# Patient Record
Sex: Male | Born: 1955 | Race: Black or African American | Hispanic: No | Marital: Single | Smoking: Current every day smoker
Health system: Southern US, Community
[De-identification: ages and names within clinical notes are randomized; demographics above are authoritative.]

## PROBLEM LIST (undated history)

## (undated) DIAGNOSIS — E785 Hyperlipidemia, unspecified: Secondary | ICD-10-CM

## (undated) DIAGNOSIS — I10 Essential (primary) hypertension: Secondary | ICD-10-CM

## (undated) DIAGNOSIS — M1990 Unspecified osteoarthritis, unspecified site: Secondary | ICD-10-CM

## (undated) DIAGNOSIS — J449 Chronic obstructive pulmonary disease, unspecified: Secondary | ICD-10-CM

## (undated) HISTORY — PX: NO PAST SURGERIES: SHX2092

---

## 1999-01-07 DIAGNOSIS — G5600 Carpal tunnel syndrome, unspecified upper limb: Secondary | ICD-10-CM | POA: Insufficient documentation

## 2017-06-09 ENCOUNTER — Emergency Department: Payer: Medicare Other

## 2017-06-09 ENCOUNTER — Other Ambulatory Visit: Payer: Self-pay

## 2017-06-09 ENCOUNTER — Encounter: Payer: Self-pay | Admitting: Emergency Medicine

## 2017-06-09 ENCOUNTER — Inpatient Hospital Stay
Admission: EM | Admit: 2017-06-09 | Discharge: 2017-06-11 | DRG: 190 | Disposition: A | Discharge status: Home / Self Care | Payer: Medicare Other | Attending: Internal Medicine | Admitting: Internal Medicine

## 2017-06-09 DIAGNOSIS — J441 Chronic obstructive pulmonary disease with (acute) exacerbation: Secondary | ICD-10-CM | POA: Diagnosis present

## 2017-06-09 DIAGNOSIS — J9601 Acute respiratory failure with hypoxia: Secondary | ICD-10-CM | POA: Diagnosis present

## 2017-06-09 DIAGNOSIS — M1990 Unspecified osteoarthritis, unspecified site: Secondary | ICD-10-CM | POA: Diagnosis present

## 2017-06-09 DIAGNOSIS — Z91013 Allergy to seafood: Secondary | ICD-10-CM

## 2017-06-09 DIAGNOSIS — F17200 Nicotine dependence, unspecified, uncomplicated: Secondary | ICD-10-CM | POA: Diagnosis present

## 2017-06-09 DIAGNOSIS — E785 Hyperlipidemia, unspecified: Secondary | ICD-10-CM | POA: Diagnosis present

## 2017-06-09 DIAGNOSIS — E876 Hypokalemia: Secondary | ICD-10-CM | POA: Diagnosis present

## 2017-06-09 DIAGNOSIS — I10 Essential (primary) hypertension: Secondary | ICD-10-CM | POA: Diagnosis present

## 2017-06-09 DIAGNOSIS — J209 Acute bronchitis, unspecified: Secondary | ICD-10-CM | POA: Diagnosis present

## 2017-06-09 DIAGNOSIS — J440 Chronic obstructive pulmonary disease with acute lower respiratory infection: Secondary | ICD-10-CM | POA: Diagnosis present

## 2017-06-09 DIAGNOSIS — Z91018 Allergy to other foods: Secondary | ICD-10-CM

## 2017-06-09 HISTORY — DX: Unspecified osteoarthritis, unspecified site: M19.90

## 2017-06-09 HISTORY — DX: Essential (primary) hypertension: I10

## 2017-06-09 HISTORY — DX: Hyperlipidemia, unspecified: E78.5

## 2017-06-09 HISTORY — DX: Chronic obstructive pulmonary disease, unspecified: J44.9

## 2017-06-09 LAB — CBC
HCT: 44.2 % (ref 40.0–52.0)
HEMOGLOBIN: 14.8 g/dL (ref 13.0–18.0)
MCH: 30.7 pg (ref 26.0–34.0)
MCHC: 33.4 g/dL (ref 32.0–36.0)
MCV: 92 fL (ref 80.0–100.0)
PLATELETS: 242 10*3/uL (ref 150–440)
RBC: 4.8 MIL/uL (ref 4.40–5.90)
RDW: 14.6 % — ABNORMAL HIGH (ref 11.5–14.5)
WBC: 4.8 10*3/uL (ref 3.8–10.6)

## 2017-06-09 LAB — BASIC METABOLIC PANEL
ANION GAP: 8 (ref 5–15)
BUN: 10 mg/dL (ref 6–20)
CHLORIDE: 104 mmol/L (ref 101–111)
CO2: 25 mmol/L (ref 22–32)
Calcium: 8.6 mg/dL — ABNORMAL LOW (ref 8.9–10.3)
Creatinine, Ser: 1 mg/dL (ref 0.61–1.24)
Glucose, Bld: 108 mg/dL — ABNORMAL HIGH (ref 65–99)
Potassium: 3.9 mmol/L (ref 3.5–5.1)
SODIUM: 137 mmol/L (ref 135–145)

## 2017-06-09 LAB — TROPONIN I: Troponin I: <0.03 ng/mL (ref <0.03)

## 2017-06-09 MED ORDER — SODIUM CHLORIDE 0.9 % IV SOLN
500.0000 mg | Freq: Once | INTRAVENOUS | Status: AC
  Administered 2017-06-09: 500 mg via INTRAVENOUS
  Filled 2017-06-09: qty 500

## 2017-06-09 MED ORDER — IPRATROPIUM-ALBUTEROL 0.5-2.5 (3) MG/3ML IN SOLN
3.0000 mL | Freq: Once | RESPIRATORY_TRACT | Status: AC
  Administered 2017-06-09: 3 mL via RESPIRATORY_TRACT
  Filled 2017-06-09: qty 3

## 2017-06-09 NOTE — H&P (Signed)
[facility] - [place] at [facility]   PATIENT NAME: Parker Brown    MR#:  [id number]  DATE OF BIRTH:  [birth year]  DATE OF ADMISSION:  06/09/2017  PRIMARY CARE PHYSICIAN: Patient, No Pcp Per   REQUESTING/REFERRING PHYSICIAN: Fanny BienQuale, MD  CHIEF COMPLAINT:   Chief Complaint  Patient presents with  . Respiratory Distress    HISTORY OF PRESENT ILLNESS:  Parker Brown  is a 62 y.o. male who presents with 3 days of progressive shortness of breath and increased wheezing.  Patient has had increased cough as well.  Work-up here in the ED is consistent with COPD exacerbation.  Patient required BiPAP initially for a brief period of time, but was able to wean off of that in the ED and is comfortable now with oxygen via nasal cannula.  Hospitalist were called for admission.  PAST MEDICAL HISTORY:   Past Medical History:  Diagnosis Date  . Arthritis   . COPD (chronic obstructive pulmonary disease) (HCC)   . Hyperlipidemia   . Hypertension      PAST SURGICAL HISTORY:   Past Surgical History:  Procedure Laterality Date  . NO PAST SURGERIES       SOCIAL HISTORY:   Social History   Tobacco Use  . Smoking status: Never Smoker  . Smokeless tobacco: Never Used  Substance Use Topics  . Alcohol use: Never    Frequency: Never     FAMILY HISTORY:  Family history reviewed and is non-contributory   DRUG ALLERGIES:   Allergies  Allergen Reactions  . Fish Oil   . Pineapple     MEDICATIONS AT HOME:   Prior to Admission medications   Not on File    REVIEW OF SYSTEMS:  Review of Systems  Constitutional: Negative for chills, fever, malaise/fatigue and weight loss.  HENT: Negative for ear pain, hearing loss and tinnitus.   Eyes: Negative for blurred vision, double vision, pain and redness.  Respiratory: Positive for cough, shortness of breath and wheezing. Negative for hemoptysis.   Cardiovascular: Negative for chest pain, palpitations, orthopnea  and leg swelling.  Gastrointestinal: Negative for abdominal pain, constipation, diarrhea, nausea and vomiting.  Genitourinary: Negative for dysuria, frequency and hematuria.  Musculoskeletal: Negative for back pain, joint pain and neck pain.  Skin:       No acne, rash, or lesions  Neurological: Negative for dizziness, tremors, focal weakness and weakness.  Endo/Heme/Allergies: Negative for polydipsia. Does not bruise/bleed easily.  Psychiatric/Behavioral: Negative for depression. The patient is not nervous/anxious and does not have insomnia.      VITAL SIGNS:   Vitals:   06/09/17 2130 06/09/17 2200 06/09/17 2215 06/09/17 2230  BP: (!) 143/98 (!) 145/92  (!) 154/93  Pulse: 80 79 76 87  Resp: 18 (!) 25 (!) 21 (!) 25  Temp:      TempSrc:      SpO2: 98% 96% 94% 95%  Weight:       Wt Readings from Last 3 Encounters:  06/09/17 68 kg (150 lb)    PHYSICAL EXAMINATION:  Physical Exam  Vitals reviewed. Constitutional: He is oriented to person, place, and time. He appears well-developed and well-nourished. No distress.  HENT:  Head: Normocephalic and atraumatic.  Mouth/Throat: Oropharynx is clear and moist.  Eyes: Pupils are equal, round, and reactive to light. Conjunctivae and EOM are normal. No scleral icterus.  Neck: Normal range of motion. Neck supple. No JVD present. No thyromegaly present.  Cardiovascular: Normal rate, regular rhythm and intact  distal pulses. Exam reveals no gallop and no friction rub.  No murmur heard. Respiratory: Effort normal. No respiratory distress. He has wheezes. He has no rales.  GI: Soft. Bowel sounds are normal. He exhibits no distension. There is no tenderness.  Musculoskeletal: Normal range of motion. He exhibits no edema.  No arthritis, no gout  Lymphadenopathy:    He has no cervical adenopathy.  Neurological: He is alert and oriented to person, place, and time. No cranial nerve deficit.  No dysarthria, no aphasia  Skin: Skin is warm and dry. No  rash noted. No erythema.  Psychiatric: He has a normal mood and affect. His behavior is normal. Judgment and thought content normal.    LABORATORY PANEL:   CBC Recent Labs  Lab 06/09/17 1946  WBC 4.8  HGB 14.8  HCT 44.2  PLT 242   ------------------------------------------------------------------------------------------------------------------  Chemistries  Recent Labs  Lab 06/09/17 1946  NA 137  K 3.9  CL 104  CO2 25  GLUCOSE 108*  BUN 10  CREATININE 1.00  CALCIUM 8.6*   ------------------------------------------------------------------------------------------------------------------  Cardiac Enzymes Recent Labs  Lab 06/09/17 1946  TROPONINI <0.03   ------------------------------------------------------------------------------------------------------------------  RADIOLOGY:  Dg Chest Port 1 View  Result Date: 06/09/2017 CLINICAL DATA:  Dyspnea for 2 days. EXAM: PORTABLE CHEST 1 VIEW COMPARISON:  None. FINDINGS: The heart size and mediastinal contours are within normal limits. Both lungs are clear. Mild hyperinflation is noted. No evidence of pneumothorax or pleural effusion. The visualized skeletal structures are unremarkable. IMPRESSION: Mild hyperinflation.  No active cardiopulmonary disease. Electronically Signed   By: Myles Rosenthal M.D.   On: 06/09/2017 20:11    EKG:   Orders placed or performed during the hospital encounter of 06/09/17  . ED EKG  . ED EKG  . EKG 12-Lead  . EKG 12-Lead  . EKG 12-Lead  . EKG 12-Lead    IMPRESSION AND PLAN:  Principal Problem:   COPD with acute exacerbation (HCC) -IV Solu-Medrol, azithromycin, duo nebs and antitussive, continue home inhalers Active Problems:   HTN (hypertension) -continue home meds   HLD (hyperlipidemia) -Home dose antilipid  Chart review performed and case discussed with ED provider. Labs, imaging and/or ECG reviewed by provider and discussed with patient/family. Management plans discussed with the  patient and/or family.  DVT PROPHYLAXIS: SubQ lovenox  GI PROPHYLAXIS: None  ADMISSION STATUS: Inpatient  CODE STATUS:   TOTAL TIME TAKING CARE OF THIS PATIENT: 45 minutes.   Laryah Neuser FIELDING 06/09/2017, 11:57 PM  [facility] [place] [facility]  Office  [phone]  CC: Primary care physician; Patient, No Pcp Per  Note:  This document was prepared using Dragon voice recognition software and may include unintentional dictation errors.

## 2017-06-09 NOTE — ED Notes (Signed)
Pt given water and meal tray.  Tolerating being off bipap well.  States some relief with breathing.

## 2017-06-09 NOTE — ED Triage Notes (Signed)
[place] c/o respiratory distress x1-2 days, 96% RA on scene with fire department, patient was given 2g mag, 125mg  solumedrol, 1 duoneb with 1 albuterol and patient became 98% oxygen saturation.  EMS vitals 29 CO2, 146/98 BP, 98 HR, 98%.  Pt presents A&Ox4, labored breathing, unable to finish complete sentences without getting SOB.

## 2017-06-09 NOTE — ED Notes (Signed)
Pt being trialed off of bipap at this time per Dr. Lorenza ChickQuale's request.  Patient instructed how to use call bell, will continue to monitor patient.

## 2017-06-09 NOTE — ED Provider Notes (Signed)
[facility] Emergency Department Provider Note   ____________________________________________   First MD Initiated Contact with Patient 06/09/17 1940     (approximate)  I have reviewed the triage vital signs and the nursing notes.   HISTORY  Chief Complaint Respiratory Distress  EM caveat: The patient tachypneic, moderate use of accessory muscles, speaking in short 1-2 word answers.  Appears notably short of breath.  Patient unable to provide notable history due to respiratory distress.  Added history obtained from EMS.  No family or other people to provide.  HPI Parker Brown is a 62 y.o. male reports a history of COPD and hypertension  Patient has had increasing shortness of breath today.  Wheezing.  Patient called EMS for severe shortness of breath.  Given 2 g magnesium, 2 nebulizer treatments, and 125 mg Solu-Medrol prehospital.  Patient reports he still having notable amount of shortness of breath.  Denies fevers or chills.  Denies any pain.  Just reports very tight in the chest and "COPD"   Past Medical History:  Diagnosis Date  . Arthritis   . COPD (chronic obstructive pulmonary disease) (HCC)   . Hyperlipidemia   . Hypertension     Patient Active Problem List   Diagnosis Date Noted  . COPD with acute exacerbation (HCC) 06/09/2017  . HTN (hypertension) 06/09/2017  . HLD (hyperlipidemia) 06/09/2017    Past Surgical History:  Procedure Laterality Date  . NO PAST SURGERIES      Prior to Admission medications   Not on File    Allergies Fish oil and Pineapple  History reviewed. No pertinent family history.  Social History Social History   Tobacco Use  . Smoking status: Never Smoker  . Smokeless tobacco: Never Used  Substance Use Topics  . Alcohol use: Never    Frequency: Never  . Drug use: Yes    Types: Marijuana    Review of Systems Constitutional: No fever/chills ENT: No sore throat. Cardiovascular: Denies chest  pain. Respiratory: Reports very short of breath and tight in the chest Neurological: Negative for headaches or weakness  EM caveat   ____________________________________________   PHYSICAL EXAM:  VITAL SIGNS: ED Triage Vitals  Enc Vitals Group     BP --      Pulse Rate 06/09/17 1940 81     Resp --      Temp 06/09/17 1940 98.2 F (36.8 C)     Temp Source 06/09/17 1940 Oral     SpO2 06/09/17 1940 96 %     Weight 06/09/17 1941 150 lb (68 kg)     Height --      Head Circumference --      Peak Flow --      Pain Score 06/09/17 1941 0     Pain Loc --      Pain Edu? --      Excl. in GC? --     Constitutional: Alert and oriented.  Moderately ill-appearing, notably dyspneic. Eyes: Conjunctivae are normal. Head: Atraumatic. Nose: No congestion/rhinnorhea. Mouth/Throat: Mucous membranes are moist. Neck: No stridor.   Cardiovascular: Normal rate, regular rhythm. Grossly normal heart sounds.  Good peripheral circulation. Respiratory: Slight forward tripoding position.  Moderate use of accessory muscles.  Appears dyspneic.  There is modest end expiratory wheezing throughout.  No crackles.  Patient appears slightly fatigued Gastrointestinal: Soft and nontender. No distention. Musculoskeletal: No lower extremity tenderness nor edema. Neurologic:  Normal speech and language. No gross focal neurologic deficits are appreciated.  Skin:  Skin is warm, slightly diaphoretic and intact. No rash noted. Psychiatric: Mood and affect are anxious.  ____________________________________________   LABS (all labs ordered are listed, but only abnormal results are displayed)  Labs Reviewed  CBC - Abnormal; Notable for the following components:      Result Value   RDW 14.6 (*)    All other components within normal limits  BASIC METABOLIC PANEL - Abnormal; Notable for the following components:   Glucose, Bld 108 (*)    Calcium 8.6 (*)    All other components within normal limits  BLOOD GAS,  VENOUS - Abnormal; Notable for the following components:   pCO2, Ven 61 (*)    Bicarbonate 32.9 (*)    Acid-Base Excess 5.0 (*)    All other components within normal limits  TROPONIN I   ____________________________________________  EKG  Reviewed and entered by me at 1945 Heart rate 85 QRS 80 QTc 460 Normal sinus rhythm, no evidence of ischemia ____________________________________________  RADIOLOGY    Chest x-ray reviewed negative for acute except for hyperinflation ____________________________________________   PROCEDURES  Procedure(s) performed: None  Procedures  Critical Care performed: Yes, see critical care note(s)  CRITICAL CARE Performed by: Sharyn Creamer   Total critical care time: 35 minutes  Critical care time was exclusive of separately billable procedures and treating other patients.  Critical care was necessary to treat or prevent imminent or life-threatening deterioration.  Critical care was time spent personally by me on the following activities: development of treatment plan with patient and/or surrogate as well as nursing, discussions with consultants, evaluation of patient's response to treatment, examination of patient, obtaining history from patient or surrogate, ordering and performing treatments and interventions, ordering and review of laboratory studies, ordering and review of radiographic studies, pulse oximetry and re-evaluation of patient's condition.  ____________________________________________   INITIAL IMPRESSION / ASSESSMENT AND PLAN / ED COURSE  Pertinent labs & imaging results that were available during my care of the patient were reviewed by me and considered in my medical decision making (see chart for details).  Presents for evaluation of moderate to severe shortness of breath with some tripoding.  Patient placed on BiPAP shortly after arrival with notable improvement in his respirations.  EKG, lab work reassuring.  No evidence  of acute cardiac etiology.  Does have a notable history of COPD and given his end expiratory wheezing and clinical presentation appears most consistent with COPD exacerbation.  Denies chest pain.  No signs or symptoms of DVT by clinical history exam.  We will continue to treat the patient with addition to EMS interventions with added nebulizers, BiPAP and further work-up.  I was pulled away during part of his evaluation to a hospital CODE BLUE.  Upon my return patient tolerating BiPAP well.  Given additional nebulizer treatment.  ----------------------------------------- 10:51 PM on 06/09/2017 -----------------------------------------  Patient has been able to be weaned off of BiPAP.  He is sitting up, tolerating well and appears much improved.  Patient agreeable with admission for further care and work-up of his COPD.  Status appears much improved.    [facility] contacted, they report no stepdown beds available.  Patient will be admitted to [facility].  ____________________________________________   FINAL CLINICAL IMPRESSION(S) / ED DIAGNOSES  Final diagnoses:  COPD exacerbation (HCC)      NEW MEDICATIONS STARTED DURING THIS VISIT:  New Prescriptions   No medications on file     Note:  This document was prepared using Dragon voice recognition  software and may include unintentional dictation errors.     Sharyn CreamerQuale, Mark, MD 06/10/17 [phone]

## 2017-06-10 LAB — CBC
HCT: 39.4 % — ABNORMAL LOW (ref 40.0–52.0)
Hemoglobin: 13.2 g/dL (ref 13.0–18.0)
MCH: 31 pg (ref 26.0–34.0)
MCHC: 33.6 g/dL (ref 32.0–36.0)
MCV: 92.1 fL (ref 80.0–100.0)
Platelets: 247 10*3/uL (ref 150–440)
RBC: 4.28 MIL/uL — ABNORMAL LOW (ref 4.40–5.90)
RDW: 14.7 % — ABNORMAL HIGH (ref 11.5–14.5)
WBC: 3 10*3/uL — ABNORMAL LOW (ref 3.8–10.6)

## 2017-06-10 LAB — BASIC METABOLIC PANEL
Anion gap: 10 (ref 5–15)
BUN: 13 mg/dL (ref 6–20)
CHLORIDE: 103 mmol/L (ref 101–111)
CO2: 23 mmol/L (ref 22–32)
CREATININE: 1.13 mg/dL (ref 0.61–1.24)
Calcium: 8.9 mg/dL (ref 8.9–10.3)
GFR calc Af Amer: >60 mL/min (ref >60)
GFR calc non Af Amer: >60 mL/min (ref >60)
GLUCOSE: 230 mg/dL — AB (ref 65–99)
Potassium: 3.4 mmol/L — ABNORMAL LOW (ref 3.5–5.1)
Sodium: 136 mmol/L (ref 135–145)

## 2017-06-10 MED ORDER — IPRATROPIUM-ALBUTEROL 0.5-2.5 (3) MG/3ML IN SOLN
3.0000 mL | RESPIRATORY_TRACT | Status: DC | PRN
  Administered 2017-06-10 – 2017-06-11 (×6): 3 mL via RESPIRATORY_TRACT
  Filled 2017-06-10 (×6): qty 3

## 2017-06-10 MED ORDER — ACETAMINOPHEN 650 MG RE SUPP: 650.0000 mg | Freq: Four times a day (QID) | RECTAL | Status: DC | PRN

## 2017-06-10 MED ORDER — ACETAMINOPHEN 325 MG PO TABS: 650.0000 mg | ORAL_TABLET | Freq: Four times a day (QID) | ORAL | Status: DC | PRN

## 2017-06-10 MED ORDER — ONDANSETRON HCL 4 MG/2ML IJ SOLN: 4.0000 mg | Freq: Four times a day (QID) | INTRAMUSCULAR | Status: DC | PRN

## 2017-06-10 MED ORDER — TIOTROPIUM BROMIDE MONOHYDRATE 18 MCG IN CAPS
18.0000 ug | ORAL_CAPSULE | Freq: Every morning | RESPIRATORY_TRACT | Status: DC
  Administered 2017-06-10 – 2017-06-11 (×2): 18 ug via RESPIRATORY_TRACT
  Filled 2017-06-10: qty 5

## 2017-06-10 MED ORDER — METHYLPREDNISOLONE SODIUM SUCC 125 MG IJ SOLR
60.0000 mg | Freq: Four times a day (QID) | INTRAMUSCULAR | Status: DC
  Administered 2017-06-10: 60 mg via INTRAVENOUS
  Filled 2017-06-10: qty 2

## 2017-06-10 MED ORDER — AZITHROMYCIN 500 MG PO TABS
500.0000 mg | ORAL_TABLET | Freq: Every day | ORAL | Status: DC
  Administered 2017-06-10: 500 mg via ORAL
  Filled 2017-06-10: qty 1

## 2017-06-10 MED ORDER — POTASSIUM CHLORIDE CRYS ER 20 MEQ PO TBCR
40.0000 meq | EXTENDED_RELEASE_TABLET | Freq: Once | ORAL | Status: AC
  Administered 2017-06-10: 09:00:00 40 meq via ORAL
  Filled 2017-06-10: qty 2

## 2017-06-10 MED ORDER — METHYLPREDNISOLONE SODIUM SUCC 40 MG IJ SOLR
40.0000 mg | Freq: Two times a day (BID) | INTRAMUSCULAR | Status: DC
  Administered 2017-06-10 – 2017-06-11 (×2): 40 mg via INTRAVENOUS
  Filled 2017-06-10 (×2): qty 1

## 2017-06-10 MED ORDER — GUAIFENESIN-DM 100-10 MG/5ML PO SYRP
5.0000 mL | ORAL_SOLUTION | ORAL | Status: DC | PRN
  Filled 2017-06-10: qty 5

## 2017-06-10 MED ORDER — ONDANSETRON HCL 4 MG PO TABS: 4.0000 mg | ORAL_TABLET | Freq: Four times a day (QID) | ORAL | Status: DC | PRN

## 2017-06-10 MED ORDER — ENOXAPARIN SODIUM 40 MG/0.4ML ~~LOC~~ SOLN: 40.0000 mg | SUBCUTANEOUS | Status: DC

## 2017-06-10 NOTE — ED Notes (Signed)
No stepdown beds available @ [facility] spoke with [facility]

## 2017-06-10 NOTE — Progress Notes (Signed)
[facility] - [place] at [facility]   PATIENT NAME: Parker Brown    MR#:  [id number]  DATE OF BIRTH:  [birth year]  SUBJECTIVE:   Patient transition from BiPAP to room air.  Still coughing and short of breath however improved since yesterday  REVIEW OF SYSTEMS:    Review of Systems  Constitutional: Negative for fever, chills weight loss HENT: Negative for ear pain, nosebleeds, congestion, facial swelling, rhinorrhea, neck pain, neck stiffness and ear discharge.   Respiratory: Positive for cough, shortness of breath, wheezing  Cardiovascular: Negative for chest pain, palpitations and leg swelling.  Gastrointestinal: Negative for heartburn, abdominal pain, vomiting, diarrhea or consitpation Genitourinary: Negative for dysuria, urgency, frequency, hematuria Musculoskeletal: Negative for back pain or joint pain Neurological: Negative for dizziness, seizures, syncope, focal weakness,  numbness and headaches.  Hematological: Does not bruise/bleed easily.  Psychiatric/Behavioral: Negative for hallucinations, confusion, dysphoric mood    Tolerating Diet:yes      DRUG ALLERGIES:   Allergies  Allergen Reactions  . Fish Oil   . Pineapple     VITALS:  Blood pressure 136/88, pulse 81, temperature 97.9 F (36.6 C), temperature source Oral, resp. rate 20, height 5\' 5"  (1.651 m), weight 65.6 kg (144 lb 11.2 oz), SpO2 99 %.  PHYSICAL EXAMINATION:  Constitutional: Appears thin and frail no distress. HENT: Normocephalic. Marland Kitchen. Oropharynx is clear and moist.  Eyes: Conjunctivae and EOM are normal. PERRLA, no scleral icterus.  Neck: Normal ROM. Neck supple. No JVD. No tracheal deviation. CVS: RRR, S1/S2 +, no murmurs, no gallops, no carotid bruit.  Pulmonary: Normal respiratory effort with decreased breath sounds throughout sounds tight some mild audible wheezing   abdominal: Soft. BS +,  no distension, tenderness, rebound or guarding.  Musculoskeletal: Normal range of  motion. No edema and no tenderness.  Neuro: Alert. CN 2-12 grossly intact. No focal deficits. Skin: Skin is warm and dry. No rash noted. Psychiatric: Normal mood and affect.      LABORATORY PANEL:   CBC Recent Labs  Lab 06/10/17 0456  WBC 3.0*  HGB 13.2  HCT 39.4*  PLT 247   ------------------------------------------------------------------------------------------------------------------  Chemistries  Recent Labs  Lab 06/10/17 0456  NA 136  K 3.4*  CL 103  CO2 23  GLUCOSE 230*  BUN 13  CREATININE 1.13  CALCIUM 8.9   ------------------------------------------------------------------------------------------------------------------  Cardiac Enzymes Recent Labs  Lab 06/09/17 1946  TROPONINI <0.03   ------------------------------------------------------------------------------------------------------------------  RADIOLOGY:  Dg Chest Port 1 View  Result Date: 06/09/2017 CLINICAL DATA:  Dyspnea for 2 days. EXAM: PORTABLE CHEST 1 VIEW COMPARISON:  None. FINDINGS: The heart size and mediastinal contours are within normal limits. Both lungs are clear. Mild hyperinflation is noted. No evidence of pneumothorax or pleural effusion. The visualized skeletal structures are unremarkable. IMPRESSION: Mild hyperinflation.  No active cardiopulmonary disease. Electronically Signed   By: Myles RosenthalJohn  Stahl M.D.   On: 06/09/2017 20:11     ASSESSMENT AND PLAN:   62 year old male with history of tobacco dependence and COPD who presents with shortness of breath.  1.  Acute hypoxic respiratory failure: Patient has been weaned from BiPAP to room air  2.  Acute exacerbation of COPD with acute bronchitis: Wean IV steroids and continue azithromycin for acute bronchitis Add cough syrup Continue nebs and inhaler  3.  Hypokalemia: Replete and recheck in a.m.  4.Tobacco dependence: Patient is encouraged to quit smoking. Counseling was provided for 4 minutes.     Management plans discussed  with the patient and  he is in agreement.  CODE STATUS: full  TOTAL TIME TAKING CARE OF THIS PATIENT: 28 minutes.     POSSIBLE D/C tomorrow, DEPENDING ON CLINICAL CONDITION.   Tate Zagal M.D on 06/10/2017 at 12:20 PM  Between 7am to 6pm - Pager - [phone] After 6pm go to [email or web address] - password [email or web address]  [facility]  Office  [phone]  CC: Primary care physician; Reid, [place], MD  Note: This dictation was prepared with Dragon dictation along with smaller phrase technology. Any transcriptional errors that result from this process are unintentional.

## 2017-06-10 NOTE — Progress Notes (Signed)
Inpatient Diabetes Program Recommendations  AACE/ADA: New Consensus Statement on Inpatient Glycemic Control (2015)  Target Ranges:  Prepandial:   less than 140 mg/dL      Peak postprandial:   less than 180 mg/dL (1-2 hours)      Critically ill patients:  140 - 180 mg/dL   Results for Parker Brown, Parker Brown (MRN [id number]) as of 06/10/2017 08:48  Ref. Range 06/09/2017 19:46 06/10/2017 04:56  Glucose Latest Ref Range: 65 - 99 mg/dL 595108 (H) 638230 (H)    Admit with: Resp Distress  History: COPD      NO History of DM noted in H&P.  Patient currently getting Solumedrol 40 mg BID.    MD- Note lab glucose elevated to 230 mg/dl this AM likely from IV steroids.  Please consider placing orders for CBG checks and Novolog Sensitive Correction Scale/ SSI (0-9 units) TID AC + HS if CBGs are elevated in hospital.      --Will follow patient during hospitalization--  Ambrose FinlandJeannine Johnston Franny Selvage RN, MSN, CDE Diabetes Coordinator Inpatient Glycemic Control Team Team Pager: [phone] (8a-5p)

## 2017-06-10 NOTE — Progress Notes (Signed)
Patient has medication in room, refuses to allow them to be stored in the pharmacy, patient educated about the safety and was told not to take  own medication while in the hospital, patient agree and understand, will continue to monitor.

## 2017-06-11 LAB — BASIC METABOLIC PANEL
Anion gap: 7 (ref 5–15)
BUN: 19 mg/dL (ref 6–20)
CHLORIDE: 105 mmol/L (ref 101–111)
CO2: 22 mmol/L (ref 22–32)
Calcium: 8.5 mg/dL — ABNORMAL LOW (ref 8.9–10.3)
Creatinine, Ser: 1.06 mg/dL (ref 0.61–1.24)
GFR calc Af Amer: >60 mL/min (ref >60)
GFR calc non Af Amer: >60 mL/min (ref >60)
Glucose, Bld: 182 mg/dL — ABNORMAL HIGH (ref 65–99)
POTASSIUM: 3.9 mmol/L (ref 3.5–5.1)
SODIUM: 134 mmol/L — AB (ref 135–145)

## 2017-06-11 LAB — HIV ANTIBODY (ROUTINE TESTING W REFLEX): HIV Screen 4th Generation wRfx: NONREACTIVE

## 2017-06-11 MED ORDER — GUAIFENESIN-DM 100-10 MG/5ML PO SYRP: 5.0000 mL | ORAL_SOLUTION | ORAL | 0 refills | Status: DC | PRN

## 2017-06-11 MED ORDER — AZITHROMYCIN 500 MG PO TABS: 500.0000 mg | ORAL_TABLET | Freq: Every day | ORAL | 0 refills | Status: AC

## 2017-06-11 MED ORDER — TIOTROPIUM BROMIDE MONOHYDRATE 18 MCG IN CAPS: 18.0000 ug | ORAL_CAPSULE | Freq: Every morning | RESPIRATORY_TRACT | 12 refills | Status: DC

## 2017-06-11 MED ORDER — PREDNISONE 20 MG PO TABS: 40.0000 mg | ORAL_TABLET | Freq: Every day | ORAL | 0 refills | Status: AC

## 2017-06-11 MED ORDER — IPRATROPIUM-ALBUTEROL 0.5-2.5 (3) MG/3ML IN SOLN: 3.0000 mL | RESPIRATORY_TRACT | 0 refills | Status: DC | PRN

## 2017-06-11 NOTE — Discharge Summary (Signed)
[employer] [facility] [place] [facility]   PATIENT NAME: Parker Brown    MR#:  [id number]  DATE OF BIRTH:  August 18, 1955  DATE OF ADMISSION:  06/09/2017 ADMITTING PHYSICIAN: Oralia Manis, MD  DATE OF DISCHARGE: 06/11/2017  PRIMARY CARE PHYSICIAN: Reid, [place], MD    ADMISSION DIAGNOSIS:  COPD exacerbation (HCC) [J44.1]  DISCHARGE DIAGNOSIS:  Principal Problem:   COPD with acute exacerbation (HCC) Active Problems:   HTN (hypertension)   HLD (hyperlipidemia)   SECONDARY DIAGNOSIS:   Past Medical History:  Diagnosis Date  . Arthritis   . COPD (chronic obstructive pulmonary disease) (HCC)   . Hyperlipidemia   . Hypertension     HOSPITAL COURSE:   62 year old male with history of COPD who presents with shortness of breath.  1.  Acute hypoxic respiratory failure: Patient was initially placed on BiPAP and wean to room air.  He ambulated around the unit without shortness of breath.  His oxygen level remained above 98%.  2.  Acute exacerbation with COPD with acute bronchitis: Patient will be discharged on oral steroids, nebulizer, inhaler and azithromycin.  3.Tobacco dependence: Patient is encouraged to quit smoking. Counseling was provided for 4 minutes. He did not want a nicotine patch discharge   DISCHARGE CONDITIONS AND DIET:   Stable for discharge on regular diet  CONSULTS OBTAINED:    DRUG ALLERGIES:   Allergies  Allergen Reactions  . Fish Oil   . Pineapple     DISCHARGE MEDICATIONS:   Allergies as of 06/11/2017      Reactions   Fish Oil    Pineapple       Medication List    TAKE these medications   azithromycin 500 MG tablet Commonly known as:  ZITHROMAX Take 1 tablet (500 mg total) by mouth daily for 3 days.   guaiFENesin-dextromethorphan 100-10 MG/5ML syrup Commonly known as:  ROBITUSSIN DM Take 5 mLs by mouth every 4 (four) hours as needed for cough.   ipratropium-albuterol 0.5-2.5 (3) MG/3ML Soln Commonly known as:  DUONEB Take  3 mLs by nebulization every 4 (four) hours as needed.   predniSONE 20 MG tablet Commonly known as:  DELTASONE Take 2 tablets (40 mg total) by mouth daily with breakfast for 3 days.   tiotropium 18 MCG inhalation capsule Commonly known as:  SPIRIVA Place 1 capsule (18 mcg total) into inhaler and inhale every morning. Start taking on:  06/12/2017         Today   CHIEF COMPLAINT:  Patient still has a cough however much improved as far shortness of breath   VITAL SIGNS:  Blood pressure (!) 141/95, pulse 94, temperature 98.1 F (36.7 C), temperature source Oral, resp. rate 18, height 5\' 5"  (1.651 m), weight 65.6 kg (144 lb 11.2 oz), SpO2 97 %.   REVIEW OF SYSTEMS:  Review of Systems  Constitutional: Negative.  Negative for chills, fever and malaise/fatigue.  HENT: Negative.  Negative for ear discharge, ear pain, hearing loss, nosebleeds and sore throat.   Eyes: Negative.  Negative for blurred vision and pain.  Respiratory: Positive for cough. Negative for hemoptysis, shortness of breath and wheezing.   Cardiovascular: Negative.  Negative for chest pain, palpitations and leg swelling.  Gastrointestinal: Negative.  Negative for abdominal pain, blood in stool, diarrhea, nausea and vomiting.  Genitourinary: Negative.  Negative for dysuria.  Musculoskeletal: Negative.  Negative for back pain.  Skin: Negative.   Neurological: Negative for dizziness, tremors, speech change, focal weakness, seizures and headaches.  Endo/Heme/Allergies:  Negative.  Does not bruise/bleed easily.  Psychiatric/Behavioral: Negative.  Negative for depression, hallucinations and suicidal ideas.     PHYSICAL EXAMINATION:  GENERAL:  [age] y.o.-year-old patient lying in the bed with no acute distress.  NECK:  Supple, no jugular venous distention. No thyroid enlargement, no tenderness.  LUNGS: Normal breath sounds bilaterally, no wheezing, rales,rhonchi  No use of accessory muscles of respiration.  CARDIOVASCULAR:  S1, S2 normal. No murmurs, rubs, or gallops.  ABDOMEN: Soft, non-tender, non-distended. Bowel sounds present. No organomegaly or mass.  EXTREMITIES: No pedal edema, cyanosis, or clubbing.  PSYCHIATRIC: The patient is alert and oriented x 3.  SKIN: No obvious rash, lesion, or ulcer.   DATA REVIEW:   CBC Recent Labs  Lab 06/10/17 0456  WBC 3.0*  HGB 13.2  HCT 39.4*  PLT 247    Chemistries  Recent Labs  Lab 06/11/17 0511  NA 134*  K 3.9  CL 105  CO2 22  GLUCOSE 182*  BUN 19  CREATININE 1.06  CALCIUM 8.5*    Cardiac Enzymes Recent Labs  Lab 06/09/17 1946  TROPONINI <0.03    Microbiology Results  @MICRORSLT48 @  RADIOLOGY:  Dg Chest Port 1 View  Result Date: 06/09/2017 CLINICAL DATA:  Dyspnea for 2 days. EXAM: PORTABLE CHEST 1 VIEW COMPARISON:  None. FINDINGS: The heart size and mediastinal contours are within normal limits. Both lungs are clear. Mild hyperinflation is noted. No evidence of pneumothorax or pleural effusion. The visualized skeletal structures are unremarkable. IMPRESSION: Mild hyperinflation.  No active cardiopulmonary disease. Electronically Signed   By: Myles RosenthalJohn  Stahl M.D.   On: 06/09/2017 20:11      Allergies as of 06/11/2017      Reactions   Fish Oil    Pineapple       Medication List    TAKE these medications   azithromycin 500 MG tablet Commonly known as:  ZITHROMAX Take 1 tablet (500 mg total) by mouth daily for 3 days.   guaiFENesin-dextromethorphan 100-10 MG/5ML syrup Commonly known as:  ROBITUSSIN DM Take 5 mLs by mouth every 4 (four) hours as needed for cough.   ipratropium-albuterol 0.5-2.5 (3) MG/3ML Soln Commonly known as:  DUONEB Take 3 mLs by nebulization every 4 (four) hours as needed.   predniSONE 20 MG tablet Commonly known as:  DELTASONE Take 2 tablets (40 mg total) by mouth daily with breakfast for 3 days.   tiotropium 18 MCG inhalation capsule Commonly known as:  SPIRIVA Place 1 capsule (18 mcg total) into inhaler  and inhale every morning. Start taking on:  06/12/2017          Management plans discussed with the patient and he is in agreement. Stable for discharge home  Patient should follow up with pcp  CODE STATUS:     Code Status Orders  (From admission, onward)        Start     Ordered   06/10/17 0110  Full code  Continuous     06/10/17 0109    Code Status History    This patient has a current code status but no historical code status.    Advance Directive Documentation     Most Recent Value  Type of Advance Directive  Living will  Pre-existing out of facility DNR order (yellow form or pink MOST form)  -  "MOST" Form in Place?  -      TOTAL TIME TAKING CARE OF THIS PATIENT: 38 minutes.    Note: This dictation was prepared  with Dragon dictation along with smaller phrase technology. Any transcriptional errors that result from this process are unintentional.  Shawni Volkov M.D on 06/11/2017 at 9:34 AM  Between 7am to 6pm - Pager - [phone] After 6pm go to [email or web address] - password [email or web address]  [facility]  Office  [phone]  CC: Primary care physician; Reid, [place], MD

## 2017-06-11 NOTE — Progress Notes (Signed)
Pt is being discharged home. Discharge papers given and explained to pt, verbalized understanding. Meds and f/u appointment reviewed. RX given. 

## 2017-06-20 LAB — BLOOD GAS, VENOUS
Acid-Base Excess: 5 mmol/L — ABNORMAL HIGH (ref 0.0–2.0)
Bicarbonate: 32.9 mmol/L — ABNORMAL HIGH (ref 20.0–28.0)
FIO2: 45
PATIENT TEMPERATURE: 37
PCO2 VEN: 61 mmHg — AB (ref 44.0–60.0)
PH VEN: 7.34 (ref 7.250–7.430)

## 2018-01-10 ENCOUNTER — Observation Stay
Admission: EM | Admit: 2018-01-10 | Discharge: 2018-01-11 | Disposition: A | Discharge status: Home / Self Care | Payer: Self-pay | Attending: Internal Medicine | Admitting: Internal Medicine

## 2018-01-10 ENCOUNTER — Emergency Department: Payer: Non-veteran care

## 2018-01-10 ENCOUNTER — Emergency Department: Payer: Self-pay

## 2018-01-10 ENCOUNTER — Other Ambulatory Visit: Payer: Self-pay

## 2018-01-10 DIAGNOSIS — I119 Hypertensive heart disease without heart failure: Secondary | ICD-10-CM | POA: Insufficient documentation

## 2018-01-10 DIAGNOSIS — F419 Anxiety disorder, unspecified: Secondary | ICD-10-CM | POA: Insufficient documentation

## 2018-01-10 DIAGNOSIS — Z8249 Family history of ischemic heart disease and other diseases of the circulatory system: Secondary | ICD-10-CM | POA: Insufficient documentation

## 2018-01-10 DIAGNOSIS — Z79899 Other long term (current) drug therapy: Secondary | ICD-10-CM | POA: Insufficient documentation

## 2018-01-10 DIAGNOSIS — M1990 Unspecified osteoarthritis, unspecified site: Secondary | ICD-10-CM | POA: Insufficient documentation

## 2018-01-10 DIAGNOSIS — J441 Chronic obstructive pulmonary disease with (acute) exacerbation: Principal | ICD-10-CM | POA: Diagnosis present

## 2018-01-10 DIAGNOSIS — E785 Hyperlipidemia, unspecified: Secondary | ICD-10-CM | POA: Insufficient documentation

## 2018-01-10 LAB — CBC WITH DIFFERENTIAL/PLATELET
Abs Immature Granulocytes: 0.01 10*3/uL (ref 0.00–0.07)
Basophils Absolute: 0.1 10*3/uL (ref 0.0–0.1)
Basophils Relative: 1 %
Eosinophils Absolute: 0.6 10*3/uL — ABNORMAL HIGH (ref 0.0–0.5)
Eosinophils Relative: 9 %
HCT: 46.1 % (ref 39.0–52.0)
Hemoglobin: 14.8 g/dL (ref 13.0–17.0)
Immature Granulocytes: 0 %
Lymphocytes Relative: 48 %
Lymphs Abs: 3.2 10*3/uL (ref 0.7–4.0)
MCH: 29.7 pg (ref 26.0–34.0)
MCHC: 32.1 g/dL (ref 30.0–36.0)
MCV: 92.4 fL (ref 80.0–100.0)
Monocytes Absolute: 0.9 10*3/uL (ref 0.1–1.0)
Monocytes Relative: 13 %
NEUTROS PCT: 29 %
Neutro Abs: 2 10*3/uL (ref 1.7–7.7)
Platelets: 212 10*3/uL (ref 150–400)
RBC: 4.99 MIL/uL (ref 4.22–5.81)
RDW: 13.9 % (ref 11.5–15.5)
WBC: 6.7 10*3/uL (ref 4.0–10.5)
nRBC: 0 % (ref 0.0–0.2)

## 2018-01-10 LAB — CBC
HCT: 41.7 % (ref 39.0–52.0)
Hemoglobin: 13.6 g/dL (ref 13.0–17.0)
MCH: 29.9 pg (ref 26.0–34.0)
MCHC: 32.6 g/dL (ref 30.0–36.0)
MCV: 91.6 fL (ref 80.0–100.0)
Platelets: 216 10*3/uL (ref 150–400)
RBC: 4.55 MIL/uL (ref 4.22–5.81)
RDW: 13.9 % (ref 11.5–15.5)
WBC: 6.1 10*3/uL (ref 4.0–10.5)
nRBC: 0 % (ref 0.0–0.2)

## 2018-01-10 LAB — COMPREHENSIVE METABOLIC PANEL
ALBUMIN: 4 g/dL (ref 3.5–5.0)
ALT: 33 U/L (ref 0–44)
AST: 43 U/L — AB (ref 15–41)
Alkaline Phosphatase: 54 U/L (ref 38–126)
Anion gap: 5 (ref 5–15)
BUN: 18 mg/dL (ref 8–23)
CHLORIDE: 104 mmol/L (ref 98–111)
CO2: 26 mmol/L (ref 22–32)
Calcium: 8.7 mg/dL — ABNORMAL LOW (ref 8.9–10.3)
Creatinine, Ser: 1.26 mg/dL — ABNORMAL HIGH (ref 0.61–1.24)
GFR calc Af Amer: >60 mL/min (ref >60)
GFR calc non Af Amer: 60 mL/min — ABNORMAL LOW (ref >60)
GLUCOSE: 109 mg/dL — AB (ref 70–99)
POTASSIUM: 4 mmol/L (ref 3.5–5.1)
SODIUM: 135 mmol/L (ref 135–145)
Total Bilirubin: 0.9 mg/dL (ref 0.3–1.2)
Total Protein: 7.9 g/dL (ref 6.5–8.1)

## 2018-01-10 LAB — LIPASE, BLOOD: Lipase: 60 U/L — ABNORMAL HIGH (ref 11–51)

## 2018-01-10 LAB — TROPONIN I: Troponin I: <0.03 ng/mL (ref <0.03)

## 2018-01-10 LAB — INFLUENZA PANEL BY PCR (TYPE A & B)
Influenza A By PCR: NEGATIVE
Influenza B By PCR: NEGATIVE

## 2018-01-10 LAB — CREATININE, SERUM
Creatinine, Ser: 1.33 mg/dL — ABNORMAL HIGH (ref 0.61–1.24)
GFR calc Af Amer: >60 mL/min (ref >60)
GFR calc non Af Amer: 56 mL/min — ABNORMAL LOW (ref >60)

## 2018-01-10 LAB — BRAIN NATRIURETIC PEPTIDE: B Natriuretic Peptide: 15 pg/mL (ref 0.0–100.0)

## 2018-01-10 MED ORDER — GUAIFENESIN-DM 100-10 MG/5ML PO SYRP: 5.0000 mL | ORAL_SOLUTION | ORAL | Status: DC | PRN

## 2018-01-10 MED ORDER — ONDANSETRON HCL 4 MG/2ML IJ SOLN: 4.0000 mg | Freq: Four times a day (QID) | INTRAMUSCULAR | Status: DC | PRN

## 2018-01-10 MED ORDER — IPRATROPIUM-ALBUTEROL 0.5-2.5 (3) MG/3ML IN SOLN
3.0000 mL | Freq: Once | RESPIRATORY_TRACT | Status: AC
  Administered 2018-01-10: 3 mL via RESPIRATORY_TRACT

## 2018-01-10 MED ORDER — TIOTROPIUM BROMIDE MONOHYDRATE 18 MCG IN CAPS
18.0000 ug | ORAL_CAPSULE | Freq: Every morning | RESPIRATORY_TRACT | Status: DC
  Administered 2018-01-10 – 2018-01-11 (×2): 18 ug via RESPIRATORY_TRACT
  Filled 2018-01-10 (×2): qty 5

## 2018-01-10 MED ORDER — LORAZEPAM 1 MG PO TABS
1.0000 mg | ORAL_TABLET | Freq: Four times a day (QID) | ORAL | Status: DC | PRN
  Administered 2018-01-10: 1 mg via ORAL
  Filled 2018-01-10: qty 1

## 2018-01-10 MED ORDER — ENOXAPARIN SODIUM 40 MG/0.4ML ~~LOC~~ SOLN
40.0000 mg | SUBCUTANEOUS | Status: DC
  Administered 2018-01-10: 40 mg via SUBCUTANEOUS
  Filled 2018-01-10: qty 0.4

## 2018-01-10 MED ORDER — GABAPENTIN 400 MG PO CAPS
400.0000 mg | ORAL_CAPSULE | Freq: Three times a day (TID) | ORAL | Status: DC
  Administered 2018-01-10 – 2018-01-11 (×3): 400 mg via ORAL
  Filled 2018-01-10 (×3): qty 1

## 2018-01-10 MED ORDER — IPRATROPIUM-ALBUTEROL 0.5-2.5 (3) MG/3ML IN SOLN
3.0000 mL | Freq: Four times a day (QID) | RESPIRATORY_TRACT | Status: DC
  Administered 2018-01-10 – 2018-01-11 (×3): 3 mL via RESPIRATORY_TRACT
  Filled 2018-01-10 (×3): qty 3

## 2018-01-10 MED ORDER — VITAMIN C 500 MG PO TABS
500.0000 mg | ORAL_TABLET | Freq: Every day | ORAL | Status: DC
  Administered 2018-01-10 – 2018-01-11 (×2): 500 mg via ORAL
  Filled 2018-01-10 (×2): qty 1

## 2018-01-10 MED ORDER — ALBUTEROL SULFATE (2.5 MG/3ML) 0.083% IN NEBU
2.5000 mg | INHALATION_SOLUTION | RESPIRATORY_TRACT | Status: DC | PRN
  Administered 2018-01-11: 2.5 mg via RESPIRATORY_TRACT
  Filled 2018-01-10: qty 3

## 2018-01-10 MED ORDER — ALBUTEROL SULFATE (2.5 MG/3ML) 0.083% IN NEBU: 5.0000 mg | INHALATION_SOLUTION | Freq: Once | RESPIRATORY_TRACT | Status: DC

## 2018-01-10 MED ORDER — ACETAMINOPHEN 325 MG PO TABS: 650.0000 mg | ORAL_TABLET | Freq: Four times a day (QID) | ORAL | Status: DC | PRN

## 2018-01-10 MED ORDER — ACETAMINOPHEN 650 MG RE SUPP: 650.0000 mg | Freq: Four times a day (QID) | RECTAL | Status: DC | PRN

## 2018-01-10 MED ORDER — METHYLPREDNISOLONE SODIUM SUCC 125 MG IJ SOLR
60.0000 mg | Freq: Two times a day (BID) | INTRAMUSCULAR | Status: DC
  Administered 2018-01-10 (×2): 60 mg via INTRAVENOUS
  Filled 2018-01-10 (×2): qty 2

## 2018-01-10 MED ORDER — ORAL CARE MOUTH RINSE
15.0000 mL | Freq: Two times a day (BID) | OROMUCOSAL | Status: DC
  Administered 2018-01-10: 15 mL via OROMUCOSAL

## 2018-01-10 MED ORDER — POLYETHYLENE GLYCOL 3350 17 G PO PACK: 17.0000 g | PACK | Freq: Every day | ORAL | Status: DC | PRN

## 2018-01-10 MED ORDER — TRAZODONE HCL 50 MG PO TABS
150.0000 mg | ORAL_TABLET | Freq: Every day | ORAL | Status: DC
  Administered 2018-01-10: 150 mg via ORAL
  Filled 2018-01-10: qty 1

## 2018-01-10 MED ORDER — ONDANSETRON HCL 4 MG PO TABS: 4.0000 mg | ORAL_TABLET | Freq: Four times a day (QID) | ORAL | Status: DC | PRN

## 2018-01-10 MED ORDER — HYDROCHLOROTHIAZIDE 25 MG PO TABS
25.0000 mg | ORAL_TABLET | Freq: Every day | ORAL | Status: DC
  Administered 2018-01-11: 25 mg via ORAL
  Filled 2018-01-10: qty 1

## 2018-01-10 NOTE — ED Notes (Signed)
Pt given breakfast tray and juice. 

## 2018-01-10 NOTE — Progress Notes (Signed)
Attempted bipap with this patient. Patient states his anxiety is too high to tolerate. Asked for mask to be removed after wearing appx 3 mins. Patient was on room air prior to my arrival. o2 saturation 95. Informed Dr. York Cerise.

## 2018-01-10 NOTE — Plan of Care (Signed)
Stable breathing treatments given.  Problem: Education: Goal: Knowledge of General Education information will improve Description Including pain rating scale, medication(s)/side effects and non-pharmacologic comfort measures Outcome: Progressing   Problem: Health Behavior/Discharge Planning: Goal: Ability to manage health-related needs will improve Outcome: Progressing   Problem: Clinical Measurements: Goal: Ability to maintain clinical measurements within normal limits will improve Outcome: Progressing Goal: Will remain free from infection Outcome: Progressing Goal: Diagnostic test results will improve Outcome: Progressing Goal: Respiratory complications will improve Outcome: Progressing Goal: Cardiovascular complication will be avoided Outcome: Progressing   Problem: Activity: Goal: Risk for activity intolerance will decrease Outcome: Progressing   Problem: Nutrition: Goal: Adequate nutrition will be maintained Outcome: Progressing   Problem: Coping: Goal: Level of anxiety will decrease Outcome: Progressing   Problem: Elimination: Goal: Will not experience complications related to bowel motility Outcome: Progressing Goal: Will not experience complications related to urinary retention Outcome: Progressing   Problem: Pain Managment: Goal: General experience of comfort will improve Outcome: Progressing   Problem: Safety: Goal: Ability to remain free from injury will improve Outcome: Progressing   Problem: Skin Integrity: Goal: Risk for impaired skin integrity will decrease Outcome: Progressing

## 2018-01-10 NOTE — ED Triage Notes (Signed)
Pt stated that he has been out of his respiratory medication for the past 2 weeks. Pt stated that he feels SOB. EMS stated that pt had wheezing on their arrival and pt was given 1 albuterol, 2 duomeds, 125 solumedrol and 2 mg of mag IV.

## 2018-01-10 NOTE — ED Notes (Signed)
ED TO INPATIENT HANDOFF REPORT  [name] 63 [facility]  Code Status Code Status History    Date Active Date Inactive Code Status Order ID Comments User Context   06/10/2017 0109 06/11/2017 1641 Full Code [id number]  Oralia ManisWillis, David, MD Inpatient      Home/SNF/Other Home  Chief Complaint Respiratory distress  Level of Care/Admitting Diagnosis ED Disposition    ED Disposition Condition Comment   Admit  [facility] [[id number]]  Level of Care: Med-Surg [16]  Diagnosis: COPD exacerbation [facility](HCC) [[id number]][[id number]]  Admitting Physician: Willadean CarolMAYO, KATY DODD [[id number]]  Attending Physician: Willadean CarolMAYO, KATY DODD [[id number]]  PT Class (Do Not Modify): Observation [104]  PT Acc Code (Do Not Modify): Observation [10022]       Medical History Past Medical History:  Diagnosis Date  . Arthritis   . COPD (chronic obstructive pulmonary disease) (HCC)   . Hyperlipidemia   . Hypertension     Allergies Allergies  Allergen Reactions  . Fish Oil   . Pineapple     IV Location/Drains/Wounds Patient Lines/Drains/Airways Status   Active Line/Drains/Airways    Name:   Placement date:   Placement time:   Site:   Days:   Peripheral IV 01/10/18 Left Antecubital   01/10/18    0528    Antecubital   less than 1          Labs/Imaging Results for orders placed or performed during the hospital encounter of 01/10/18 (from the past 48 hour(s))  Comprehensive metabolic panel     Status: Abnormal   Collection Time: 01/10/18  5:35 AM  Result Value Ref Range   Sodium 135 135 - 145 mmol/L   Potassium 4.0 3.5 - 5.1 mmol/L    Comment: HEMOLYSIS AT THIS LEVEL MAY AFFECT RESULT   Chloride 104 98 - 111 mmol/L   CO2 26 22 - 32 mmol/L   Glucose, Bld 109 (H) 70 - 99 mg/dL   BUN 18 8 - 23 mg/dL   Creatinine, Ser 9.521.26 (H) 0.61 - 1.24 mg/dL   Calcium 8.7 (L) 8.9 - 10.3 mg/dL   Total Protein 7.9 6.5 - 8.1 g/dL   Albumin 4.0 3.5 - 5.0 g/dL   AST 43 (H) 15 - 41 U/L   Comment: HEMOLYSIS AT THIS LEVEL MAY AFFECT RESULT   ALT 33 0 - 44 U/L   Alkaline Phosphatase 54 38 - 126 U/L   Total Bilirubin 0.9 0.3 - 1.2 mg/dL    Comment: HEMOLYSIS AT THIS LEVEL MAY AFFECT RESULT   GFR calc non Af Amer 60 (L) >60 mL/min   GFR calc Af Amer >60 >60 mL/min   Anion gap 5 5 - 15    Comment: Performed at [facility], [address] [place], [place] [address]  Lipase, blood     Status: Abnormal   Collection Time: 01/10/18  5:35 AM  Result Value Ref Range   Lipase 60 (H) 11 - 51 U/L    Comment: Performed at [facility], [address] [place], [place] [address]  Brain natriuretic peptide     Status: None   Collection Time: 01/10/18  5:35 AM  Result Value Ref Range   B Natriuretic Peptide 15.0 0.0 - 100.0 pg/mL    Comment: Performed at [facility], [address] [place], [place] [address]  Troponin I - Once     Status: None   Collection Time: 01/10/18  5:35 AM  Result Value Ref Range  Troponin I <0.03 <0.03 ng/mL    Comment: Performed at [facility], [address] [place], [place] [address]  CBC with Differential     Status: Abnormal   Collection Time: 01/10/18  5:35 AM  Result Value Ref Range   WBC 6.7 4.0 - 10.5 K/uL   RBC 4.99 4.22 - 5.81 MIL/uL   Hemoglobin 14.8 13.0 - 17.0 g/dL   HCT 09.4 07.6 - 80.8 %   MCV 92.4 80.0 - 100.0 fL   MCH 29.7 26.0 - 34.0 pg   MCHC 32.1 30.0 - 36.0 g/dL   RDW 81.1 03.1 - 59.4 %   Platelets 212 150 - 400 K/uL   nRBC 0.0 0.0 - 0.2 %   Neutrophils Relative % 29 %   Neutro Abs 2.0 1.7 - 7.7 K/uL   Lymphocytes Relative 48 %   Lymphs Abs 3.2 0.7 - 4.0 K/uL   Monocytes Relative 13 %   Monocytes Absolute 0.9 0.1 - 1.0 K/uL   Eosinophils Relative 9 %   Eosinophils Absolute 0.6 (H) 0.0 - 0.5 K/uL   Basophils Relative 1 %   Basophils Absolute 0.1 0.0 - 0.1 K/uL   Immature Granulocytes 0 %   Abs Immature Granulocytes 0.01 0.00 - 0.07 K/uL    Comment: Performed at [facility], [address] [place], [place] [address]  Blood gas, venous     Status: Abnormal (Preliminary result)   Collection Time: 01/10/18  5:44 AM  Result Value Ref Range   pH, Ven 7.32 7.250 - 7.430   pCO2, Ven 57 44.0 - 60.0 mmHg   pO2, Ven PENDING 32.0 - 45.0 mmHg   Bicarbonate 29.4 (H) 20.0 - 28.0 mmol/L   Acid-Base Excess 2.0 0.0 - 2.0 mmol/L   O2 Saturation 53.3 %   Patient temperature 37.0    Collection site LINE    Sample type VENOUS     Comment: Performed at [facility], [address], [place], [place] [address]  Influenza panel by PCR (type A & B)     Status: None   Collection Time: 01/10/18  6:15 AM  Result Value Ref Range   Influenza A By PCR NEGATIVE NEGATIVE   Influenza B By PCR NEGATIVE NEGATIVE    Comment: (NOTE) The Xpert Xpress Flu assay is intended as an aid in the diagnosis of  influenza and should not be used as a sole basis for treatment.  This  assay is FDA approved for nasopharyngeal swab specimens only. Nasal  washings and aspirates are unacceptable for Xpert Xpress Flu testing. Performed at [facility], [address] [place], [place] [address]    Dg Chest Portable 1 View  Result Date: 01/10/2018 CLINICAL DATA:  Acute onset of shortness of breath and wheezing. EXAM: PORTABLE CHEST 1 VIEW COMPARISON:  Chest radiograph performed 06/09/2017 FINDINGS: The lungs are mildly hyperexpanded and grossly clear. There is no evidence of focal opacification, pleural effusion or pneumothorax. The cardiomediastinal silhouette is within normal limits. No acute osseous abnormalities are seen. IMPRESSION: No acute cardiopulmonary process seen. Electronically Signed   By: Roanna Raider M.D.   On: 01/10/2018 06:41    Pending Labs Unresulted Labs (From admission, onward)    Start     Ordered   Signed and Held  CBC  (enoxaparin (LOVENOX)    CrCl >/= 30 ml/min)  Once,   R    Comments:  Baseline for enoxaparin therapy IF NOT ALREADY DRAWN.  Notify MD if  PLT < 100 K.    Signed and Held   Signed and Held  Creatinine, serum  (enoxaparin (LOVENOX)    CrCl >/= 30 ml/min)  Once,   R    Comments:  Baseline for enoxaparin therapy IF NOT ALREADY DRAWN.    Signed and Held   Signed and Held  Creatinine, serum  (enoxaparin (LOVENOX)    CrCl >/= 30 ml/min)  Weekly,   R    Comments:  while on enoxaparin therapy    Signed and Held   Signed and Held  Basic metabolic panel  Tomorrow morning,   R     Signed and Held   Signed and Held  CBC  Tomorrow morning,   R     Signed and Held          Vitals/Pain Today's Vitals   01/10/18 0730 01/10/18 0800 01/10/18 0830 01/10/18 0900  BP: 112/89 108/75 110/67 114/75  Pulse: 89 84 88 85  Resp: (!) 30 20 16  (!) 22  SpO2: (!) 88% 93% 92% 93%  Weight:      Height:      PainSc:        Isolation Precautions Droplet precaution  Medications Medications  ipratropium-albuterol (DUONEB) 0.5-2.5 (3) MG/3ML nebulizer solution 3 mL (3 mLs Nebulization Given 01/10/18 0559)  ipratropium-albuterol (DUONEB) 0.5-2.5 (3) MG/3ML nebulizer solution 3 mL (3 mLs Nebulization Given 01/10/18 3785)    Mobility walks

## 2018-01-10 NOTE — ED Notes (Signed)
Pt refused BiPap. Dr. York Cerise made aware.

## 2018-01-10 NOTE — ED Provider Notes (Signed)
[facility] Emergency Department Provider Note  ____________________________________________   First MD Initiated Contact with Patient 01/10/18 0600     (approximate)  I have reviewed the triage vital signs and the nursing notes.   HISTORY  Chief Complaint Shortness of Breath   Level 5 caveat:  history/ROS limited by acute/critical illness   HPI Parker Brown is a 63 y.o. male his medical history most notably includes COPD and presents by EMS for evaluation of gradually worsening shortness of breath over the last 2 to 3 days.  He reports that he has been out of all of his COPD medications for about 2 weeks but he has not had any trouble into the last couple of days.  Over that period of time his breathing is been gradually worsening.  He has started wheezing and having frequent cough.  Nothing in particular makes it better and exertion makes his symptoms worse.  He denies fever, chest pain, nausea, vomiting, and abdominal pain.  When the paramedics arrived he was in severe distress.  Prior to arrival to the ED they administered 1 albuterol treatment, 2 duo nebs, Solu-Medrol 125 mg IV, and 2 g of magnesium by IV.  He was still wheezing and short of breath upon arrival and was started on another DuoNeb.  He reports feeling a little bit better but that his symptoms are still severe.  Past Medical History:  Diagnosis Date  . Arthritis   . COPD (chronic obstructive pulmonary disease) (HCC)   . Hyperlipidemia   . Hypertension     Patient Active Problem List   Diagnosis Date Noted  . COPD exacerbation (HCC) 01/10/2018  . COPD with acute exacerbation (HCC) 06/09/2017  . HTN (hypertension) 06/09/2017  . HLD (hyperlipidemia) 06/09/2017    Past Surgical History:  Procedure Laterality Date  . NO PAST SURGERIES      Prior to Admission medications   Medication Sig Start Date End Date Taking? Authorizing Provider  gabapentin (NEURONTIN) 400 MG capsule Take  400 mg by mouth 3 (three) times daily.   Yes [provider]  guaiFENesin-dextromethorphan (ROBITUSSIN DM) 100-10 MG/5ML syrup Take 5 mLs by mouth every 4 (four) hours as needed for cough. 06/11/17  Yes Mody, Patricia PesaSital, MD  hydrochlorothiazide (HYDRODIURIL) 25 MG tablet Take 25 mg by mouth daily.   Yes [provider]  ipratropium-albuterol (DUONEB) 0.5-2.5 (3) MG/3ML SOLN Take 3 mLs by nebulization every 4 (four) hours as needed. 06/11/17  Yes Adrian SaranMody, Sital, MD  tiotropium (SPIRIVA) 18 MCG inhalation capsule Place 1 capsule (18 mcg total) into inhaler and inhale every morning. 06/12/17  Yes Adrian SaranMody, Sital, MD  traZODone (DESYREL) 100 MG tablet Take 150 mg by mouth at bedtime.   Yes [provider]  vitamin C (ASCORBIC ACID) 500 MG tablet Take 500 mg by mouth daily.   Yes [provider]    Allergies Fish oil and Pineapple  No family history on file.  Social History Social History   Tobacco Use  . Smoking status: Never Smoker  . Smokeless tobacco: Never Used  Substance Use Topics  . Alcohol use: Never    Frequency: Never  . Drug use: Yes    Types: Marijuana    Review of Systems Constitutional: No fever/chills Eyes: No visual changes. ENT: No sore throat. Cardiovascular: Denies chest pain. Respiratory: Severe shortness of breath over the last couple of days with wheezing Gastrointestinal: No abdominal pain.  No nausea, no vomiting.  No diarrhea.  No constipation. Genitourinary:  Negative for dysuria. Musculoskeletal: Negative for neck pain.  Negative for back pain. Integumentary: Negative for rash. Neurological: Negative for headaches, focal weakness or numbness.   ____________________________________________   PHYSICAL EXAM:  VITAL SIGNS: ED Triage Vitals  Enc Vitals Group     BP 01/10/18 0529 (!) 149/93     Pulse Rate 01/10/18 0529 (!) 102     Resp 01/10/18 0529 18     Temp --      Temp src --      SpO2 01/10/18 0529 100 %     Weight 01/10/18  0530 70.3 kg (155 lb)     Height 01/10/18 0530 1.676 m (5\' 6" )     Head Circumference --      Peak Flow --      Pain Score 01/10/18 0528 5     Pain Loc --      Pain Edu? --      Excl. in GC? --     Constitutional: Alert and oriented.  Moderate to severe respiratory distress. Eyes: Conjunctivae are normal.  Head: Atraumatic. Nose: No congestion/rhinnorhea. Mouth/Throat: Mucous membranes are dry. Neck: No stridor.  No meningeal signs.   Cardiovascular: Borderline tachycardia, regular rhythm. Good peripheral circulation. Grossly normal heart sounds. Respiratory: Increased respiratory effort with accessory muscle usage and intercostal retractions.  He has decreased air movement throughout with wheezing in spite of 4 breathing treatments at the time I evaluated him. Gastrointestinal: Thin body habitus.  Soft and nontender. No distention.  Musculoskeletal: No lower extremity tenderness nor edema. No gross deformities of extremities. Neurologic:  Normal speech and language. No gross focal neurologic deficits are appreciated.  Skin:  Skin is warm, dry and intact. No rash noted. Psychiatric: Mood and affect are anxious but understandable under the circumstances.  ____________________________________________   LABS (all labs ordered are listed, but only abnormal results are displayed)  Labs Reviewed  COMPREHENSIVE METABOLIC PANEL - Abnormal; Notable for the following components:      Result Value   Glucose, Bld 109 (*)    Creatinine, Ser 1.26 (*)    Calcium 8.7 (*)    AST 43 (*)    GFR calc non Af Amer 60 (*)    All other components within normal limits  LIPASE, BLOOD - Abnormal; Notable for the following components:   Lipase 60 (*)    All other components within normal limits  CBC WITH DIFFERENTIAL/PLATELET - Abnormal; Notable for the following components:   Eosinophils Absolute 0.6 (*)    All other components within normal limits  BLOOD GAS, VENOUS - Abnormal; Notable for the  following components:   Bicarbonate 29.4 (*)    All other components within normal limits  BRAIN NATRIURETIC PEPTIDE  TROPONIN I  INFLUENZA PANEL BY PCR (TYPE A & B)   ____________________________________________  EKG  ED ECG REPORT I, Loleta Roseory Odessie Polzin, the attending physician, personally viewed and interpreted this ECG.  Date: 01/10/2018 EKG Time: 5:32 AM Rate: 95 Rhythm: normal sinus rhythm QRS Axis: normal Intervals: normal ST/T Wave abnormalities: Non-specific ST segment / T-wave changes, but no clear evidence of acute ischemia. Narrative Interpretation: no definitive evidence of acute ischemia; does not meet STEMI criteria.   ____________________________________________  RADIOLOGY I, Loleta Roseory Trisha Morandi, personally viewed and evaluated these images (plain radiographs) as part of my medical decision making, as well as reviewing the written report by the radiologist.  ED MD interpretation: No acute abnormalities identified on chest x-ray  Official radiology report(s): Dg Chest Portable 1 View  Result Date: 01/10/2018 CLINICAL DATA:  Acute onset of shortness of breath and wheezing. EXAM: PORTABLE CHEST 1 VIEW COMPARISON:  Chest radiograph performed 06/09/2017 FINDINGS: The lungs are mildly hyperexpanded and grossly clear. There is no evidence of focal opacification, pleural effusion or pneumothorax. The cardiomediastinal silhouette is within normal limits. No acute osseous abnormalities are seen. IMPRESSION: No acute cardiopulmonary process seen. Electronically Signed   By: Roanna Raider M.D.   On: 01/10/2018 06:41    ____________________________________________   PROCEDURES  Critical Care performed: Yes, see critical care procedure note(s)   Procedure(s) performed:   .Critical Care Performed by: Loleta Rose, MD Authorized by: Loleta Rose, MD   Critical care provider statement:    Critical care time (minutes):  30   Critical care time was exclusive of:  Separately  billable procedures and treating other patients   Critical care was necessary to treat or prevent imminent or life-threatening deterioration of the following conditions:  Respiratory failure   Critical care was time spent personally by me on the following activities:  Development of treatment plan with patient or surrogate, discussions with consultants, evaluation of patient's response to treatment, examination of patient, obtaining history from patient or surrogate, ordering and performing treatments and interventions, ordering and review of laboratory studies, ordering and review of radiographic studies, pulse oximetry, re-evaluation of patient's condition and review of old charts     ____________________________________________   INITIAL IMPRESSION / ASSESSMENT AND PLAN / ED COURSE  As part of my medical decision making, I reviewed the following data within the electronic MEDICAL RECORD NUMBER Nursing notes reviewed and incorporated, Labs reviewed , EKG interpreted , Old chart reviewed, Radiograph reviewed , Discussed with admitting physician  and Notes from prior ED visits    Differential diagnosis includes, but is not limited to, COPD exacerbation, viral illness such as influenza, community-acquired pneumonia, less likely PE, ACS, pneumothorax, and aortic dissection.  The patient has been out of his medications for a couple of weeks and he became symptomatic gradually over the last few days.  His presentation is consistent with COPD exacerbation.  His vital signs are stable in spite of his significantly increased work of breathing and respiratory distress.  He has received maximal therapy prior to arrival to the hospital and he is still short of breath.  I am giving him a third DuoNeb and will give him another albuterol treatment after the DuoNeb, but given his current respiratory status I am starting him on BiPAP to see if he improves.  Hopefully can be weaned off of it and he states that he has  never been intubated or had to use the BiPAP before.  I am also having him swab for influenza prior to placing the BiPAP because this could be the trigger for his exacerbation.  Clinical Course as of Jan 11 844  Sun Jan 10, 2018  9562 Patient could not tolerate the BiPAP more than a few minutes.  I will not try to sedate him and wants to continue to treat him more conservatively.  His influenza panel was negative.  Labs notable for a PCO2 of 57 which is generally reassuring, normal troponin, normal BNP, normal CBC, slightly elevated lipase of unknown significance, and essentially normal comprehensive metabolic panel.  I will speak with the hospitalist about admission  for COPD exacerbation requiring frequent albuterol treatments.   [CF]    Clinical Course User Index [CF] Loleta Rose, MD    ____________________________________________  FINAL CLINICAL IMPRESSION(S) / ED DIAGNOSES  Final diagnoses:  COPD exacerbation (HCC)     MEDICATIONS GIVEN DURING THIS VISIT:  Medications  ipratropium-albuterol (DUONEB) 0.5-2.5 (3) MG/3ML nebulizer solution 3 mL (3 mLs Nebulization Given 01/10/18 0559)  ipratropium-albuterol (DUONEB) 0.5-2.5 (3) MG/3ML nebulizer solution 3 mL (3 mLs Nebulization Given 01/10/18 9767)     ED Discharge Orders    None       Note:  This document was prepared using Dragon voice recognition software and may include unintentional dictation errors.    Loleta Rose, MD 01/10/18 [phone]

## 2018-01-10 NOTE — Care Management Obs Status (Signed)
MEDICARE OBSERVATION STATUS NOTIFICATION   Patient Details  Name: Parker EulerMonroe Morris MRN: [id number] Date of Birth: 1955-02-22   Medicare Observation Status Notification Given:  Yes    Liylah Najarro A Manley Fason, RN 01/10/2018, 11:14 AM

## 2018-01-10 NOTE — ED Notes (Signed)
Ginger ale given to pt per Dr York Cerise

## 2018-01-10 NOTE — H&P (Signed)
[facility] [place] [facility]   PATIENT NAME: Parker Brown    MR#:  [id number]  DATE OF BIRTH:  09/02/1955  DATE OF ADMISSION:  01/10/2018  PRIMARY CARE PHYSICIAN: Parker Brown, [place], MD   REQUESTING/REFERRING PHYSICIAN: Loleta Rose, MD  CHIEF COMPLAINT:   Chief Complaint  Patient presents with  . Shortness of Breath    HISTORY OF PRESENT ILLNESS:  Parker Brown  is a 63 y.o. male with a known history of hypertension, hyperlipidemia, COPD who presented to the ED with shortness of breath.  He states he ran out of his inhalers about 2 weeks ago.  His shortness of breath started 2 days ago and has been getting progressively worse.  He endorses cough productive of "tan" sputum.  He denies any fevers or chills.  He endorses abdominal pain and headache secondary to cough.  He denies any chest pain, lower extremity edema, orthopnea.  In the ED, O2 saturations were 88-93% on room air.  Vitals were otherwise unremarkable.  Labs are significant for creatinine 1.33.  Flu was negative.  Chest x-ray was unremarkable.  He was given multiple duo nebs, steroids, mag without improvement in his work of breathing.  Hospitalists were called for admission.  PAST MEDICAL HISTORY:   Past Medical History:  Diagnosis Date  . Arthritis   . COPD (chronic obstructive pulmonary disease) (HCC)   . Hyperlipidemia   . Hypertension     PAST SURGICAL HISTORY:   Past Surgical History:  Procedure Laterality Date  . NO PAST SURGERIES      SOCIAL HISTORY:   Social History   Tobacco Use  . Smoking status: Never Smoker  . Smokeless tobacco: Never Used  Substance Use Topics  . Alcohol use: Never    Frequency: Never    FAMILY HISTORY:  Father-heart disease  DRUG ALLERGIES:   Allergies  Allergen Reactions  . Fish Oil   . Pineapple     REVIEW OF SYSTEMS:   Review of Systems  Constitutional: Negative for chills and fever.  HENT: Negative for congestion and sore throat.    Eyes: Negative for blurred vision and double vision.  Respiratory: Positive for cough, sputum production and shortness of breath.   Cardiovascular: Negative for chest pain, orthopnea and leg swelling.  Gastrointestinal: Negative for nausea and vomiting.  Genitourinary: Negative for dysuria and urgency.  Musculoskeletal: Negative for back pain and neck pain.  Neurological: Negative for dizziness and headaches.  Psychiatric/Behavioral: Negative for depression. The patient is not nervous/anxious.     MEDICATIONS AT HOME:   Prior to Admission medications   Medication Sig Start Date End Date Taking? Authorizing Provider  gabapentin (NEURONTIN) 400 MG capsule Take 400 mg by mouth 3 (three) times daily.   Yes [provider]  guaiFENesin-dextromethorphan (ROBITUSSIN DM) 100-10 MG/5ML syrup Take 5 mLs by mouth every 4 (four) hours as needed for cough. 06/11/17  Yes Mody, Patricia Pesa, MD  hydrochlorothiazide (HYDRODIURIL) 25 MG tablet Take 25 mg by mouth daily.   Yes [provider]  ipratropium-albuterol (DUONEB) 0.5-2.5 (3) MG/3ML SOLN Take 3 mLs by nebulization every 4 (four) hours as needed. 06/11/17  Yes Adrian Saran, MD  tiotropium (SPIRIVA) 18 MCG inhalation capsule Place 1 capsule (18 mcg total) into inhaler and inhale every morning. 06/12/17  Yes Adrian Saran, MD  traZODone (DESYREL) 100 MG tablet Take 150 mg by mouth at bedtime.   Yes [provider]  vitamin C (ASCORBIC ACID) 500 MG tablet Take 500 mg  by mouth daily.   Yes [provider]      VITAL SIGNS:  Blood pressure 124/69, pulse 94, temperature 98 F (36.7 C), temperature source Oral, resp. rate 20, height 5\' 6"  (1.676 m), weight 70.3 kg, SpO2 95 %.  PHYSICAL EXAMINATION:  Physical Exam  GENERAL:  63 y.o.-year-old patient lying in the bed with no acute distress.  EYES: Pupils equal, round, reactive to light and accommodation. No scleral icterus. Extraocular muscles intact.  HEENT: Head atraumatic,  normocephalic. Oropharynx and nasopharynx clear.  NECK:  Supple, no jugular venous distention. No thyroid enlargement, no tenderness.  LUNGS: + Diffuse expiratory wheezing present, diminished air movement throughout all lung fields, normal work of breathing on room air CARDIOVASCULAR: RRR, S1, S2 normal. No murmurs, rubs, or gallops.  ABDOMEN: Soft, nontender, nondistended. Bowel sounds present. No organomegaly or mass.  EXTREMITIES: No pedal edema, cyanosis, or clubbing.  NEUROLOGIC: Cranial nerves II through XII are intact. Muscle strength 5/5 in all extremities. Sensation intact. Gait not checked.  PSYCHIATRIC: The patient is alert and oriented x 3.  SKIN: No obvious rash, lesion, or ulcer.   LABORATORY PANEL:   CBC Recent Labs  Lab 01/10/18 1045  WBC 6.1  HGB 13.6  HCT 41.7  PLT 216   ------------------------------------------------------------------------------------------------------------------  Chemistries  Recent Labs  Lab 01/10/18 0535 01/10/18 1045  NA 135  --   K 4.0  --   CL 104  --   CO2 26  --   GLUCOSE 109*  --   BUN 18  --   CREATININE 1.26* 1.33*  CALCIUM 8.7*  --   AST 43*  --   ALT 33  --   ALKPHOS 54  --   BILITOT 0.9  --    ------------------------------------------------------------------------------------------------------------------  Cardiac Enzymes Recent Labs  Lab 01/10/18 0535  TROPONINI <0.03   ------------------------------------------------------------------------------------------------------------------  RADIOLOGY:  Dg Chest Portable 1 View  Result Date: 01/10/2018 CLINICAL DATA:  Acute onset of shortness of breath and wheezing. EXAM: PORTABLE CHEST 1 VIEW COMPARISON:  Chest radiograph performed 06/09/2017 FINDINGS: The lungs are mildly hyperexpanded and grossly clear. There is no evidence of focal opacification, pleural effusion or pneumothorax. The cardiomediastinal silhouette is within normal limits. No acute osseous  abnormalities are seen. IMPRESSION: No acute cardiopulmonary process seen. Electronically Signed   By: Roanna RaiderJeffery  Chang M.D.   On: 01/10/2018 06:41      IMPRESSION AND PLAN:   COPD exacerbation- likely related to patient running out of home inhalers.  Chest x-ray negative for pneumonia. -IV Solu-Medrol -DuoNebs as needed -Continue home inhalers  Hypertension-blood pressures normal -Continue HCTZ  Acute anxiety-likely related to increased work of breathing -Ativan every 6 hours as needed  DVT prophylaxis- Lovenox  Patient can likely be discharged home tomorrow.  All the records are reviewed and case discussed with ED provider. Management plans discussed with the patient, family and they are in agreement.  CODE STATUS: Full  TOTAL TIME TAKING CARE OF THIS PATIENT: 45 minutes.    Jinny BlossomKaty D Mayo M.D on 01/10/2018 at 1:33 PM  Between 7am to 6pm - Pager - [phone]  After 6pm go to [email or web address] - Social research officer, governmentpassword [email or web address] [email or web address]  [facility]  Office  [phone]  CC: Primary care physician; Parker Brown, [place], MD   Note: This dictation was prepared with Dragon dictation along with smaller phrase technology. Any transcriptional errors that result from this process are unintentional.

## 2018-01-10 NOTE — Progress Notes (Signed)
Family Meeting Note  Advance Directive:yes  Today a meeting took place with the Patient.  Patient is able to participate.  The following clinical team members were present during this meeting:MD  The following were discussed:Patient's diagnosis: , Patient's progosis: Unable to determine and Goals for treatment: Full Code   Patient is aware that he has COPD. He states that his breathing is usually under good control. He just ran out of his inhalers this time, which caused this hospitalization. We discussed code status in detail. He would like to be full code at this time.  Additional follow-up to be provided: prn  Time spent during discussion:20 minutes  Hilton SinclairKaty D Aniylah Avans, MD

## 2018-01-11 LAB — BASIC METABOLIC PANEL
Anion gap: 10 (ref 5–15)
BUN: 20 mg/dL (ref 8–23)
CO2: 22 mmol/L (ref 22–32)
Calcium: 9.3 mg/dL (ref 8.9–10.3)
Chloride: 105 mmol/L (ref 98–111)
Creatinine, Ser: 1.28 mg/dL — ABNORMAL HIGH (ref 0.61–1.24)
GFR calc Af Amer: >60 mL/min (ref >60)
GFR, EST NON AFRICAN AMERICAN: 59 mL/min — AB (ref >60)
Glucose, Bld: 139 mg/dL — ABNORMAL HIGH (ref 70–99)
Potassium: 4 mmol/L (ref 3.5–5.1)
Sodium: 137 mmol/L (ref 135–145)

## 2018-01-11 LAB — CBC
HCT: 39.4 % (ref 39.0–52.0)
HEMOGLOBIN: 12.9 g/dL — AB (ref 13.0–17.0)
MCH: 29.8 pg (ref 26.0–34.0)
MCHC: 32.7 g/dL (ref 30.0–36.0)
MCV: 91 fL (ref 80.0–100.0)
Platelets: 225 10*3/uL (ref 150–400)
RBC: 4.33 MIL/uL (ref 4.22–5.81)
RDW: 13.9 % (ref 11.5–15.5)
WBC: 9 10*3/uL (ref 4.0–10.5)
nRBC: 0 % (ref 0.0–0.2)

## 2018-01-11 MED ORDER — PREDNISONE 50 MG PO TABS: 50.0000 mg | ORAL_TABLET | Freq: Every day | ORAL | 0 refills | Status: AC

## 2018-01-11 MED ORDER — PREDNISONE 50 MG PO TABS
50.0000 mg | ORAL_TABLET | Freq: Every day | ORAL | Status: DC
  Administered 2018-01-11: 50 mg via ORAL
  Filled 2018-01-11: qty 1

## 2018-01-11 MED ORDER — IPRATROPIUM-ALBUTEROL 0.5-2.5 (3) MG/3ML IN SOLN: 3.0000 mL | RESPIRATORY_TRACT | 0 refills | Status: DC | PRN

## 2018-01-11 NOTE — Discharge Instructions (Signed)
COPD and Physical Activity Chronic obstructive pulmonary disease (COPD) is a long-term (chronic) condition that affects the lungs. COPD is a general term that can be used to describe many different lung problems that cause lung swelling (inflammation) and limit airflow, including chronic bronchitis and emphysema. The main symptom of COPD is shortness of breath, which makes it harder to do even simple tasks. This can also make it harder to exercise and be active. Talk with your health care provider about treatments to help you breathe better and actions you can take to prevent breathing problems during physical activity. What are the benefits of exercising with COPD? Exercising regularly is an important part of a healthy lifestyle. You can still exercise and do physical activities even though you have COPD. Exercise and physical activity improve your shortness of breath by increasing blood flow (circulation). This causes your heart to pump more oxygen through your body. Moderate exercise can improve your:  Oxygen use.  Energy level.  Shortness of breath.  Strength in your breathing muscles.  Heart health.  Sleep.  Self-esteem and feelings of self-worth.  Depression, stress, and anxiety levels. Exercise can benefit everyone with COPD. The severity of your disease may affect how hard you can exercise, especially at first, but everyone can benefit. Talk with your health care provider about how much exercise is safe for you, and which activities and exercises are safe for you. What actions can I take to prevent breathing problems during physical activity?  Sign up for a pulmonary rehabilitation program. This type of program may include: ? Education about lung diseases. ? Exercise classes that teach you how to exercise and be more active while improving your breathing. This usually involves:  Exercise using your lower extremities, such as a stationary bicycle.  About 30 minutes of  exercise, 2 to 5 times per week, for 6 to 12 weeks  Strength training, such as push ups or leg lifts. ? Nutrition education. ? Group classes in which you can talk with others who also have COPD and learn ways to manage stress.  If you use an oxygen tank, you should use it while you exercise. Work with your health care provider to adjust your oxygen for your physical activity. Your resting flow rate is different from your flow rate during physical activity.  While you are exercising: ? Take slow breaths. ? Pace yourself and do not try to go too fast. ? Purse your lips while breathing out. Pursing your lips is similar to a kissing or whistling position. ? If doing exercise that uses a quick burst of effort, such as weight lifting:  Breathe in before starting the exercise.  Breathe out during the hardest part of the exercise (such as raising the weights). Where to find support You can find support for exercising with COPD from:  Your health care provider.  A pulmonary rehabilitation program.  Your local health department or community health programs.  Support groups, online or in-person. Your health care provider may be able to recommend support groups. Where to find more information You can find more information about exercising with COPD from:  American Lung Association: ClassInsider.se.  COPD Foundation: https://www.rivera.net/. Contact a health care provider if:  Your symptoms get worse.  You have chest pain.  You have nausea.  You have a fever.  You have trouble talking or catching your breath.  You want to start a new exercise program or a new activity. Summary  COPD is a general term that  can be used to describe many different lung problems that cause lung swelling (inflammation) and limit airflow. This includes chronic bronchitis and emphysema.  Exercise and physical activity improve your shortness of breath by increasing blood flow (circulation). This causes your heart to  provide more oxygen to your body.  Contact your health care provider before starting any exercise program or new activity. Ask your health care provider what exercises and activities are safe for you. This information is not intended to replace advice given to you by your health care provider. Make sure you discuss any questions you have with your health care provider. Document Released: 01/15/2017 Document Revised: 01/15/2017 Document Reviewed: 01/15/2017 Elsevier Interactive Patient Education  [employer]. It was so nice to meet you during this hospitalization!  You came into the hospital with a worsening of your COPD. We gave you breathing treatments and steroids and your breathing got better.  Please make sure you take Prednisone 50mg  daily for 3 more days starting tomorrow.  You should also use your nebulizer machine every 4-6 hours for the next couple of days.  Take care, Dr. Nancy Marus

## 2018-01-11 NOTE — Progress Notes (Signed)
Nsg Discharge Note  Admit Date:  01/10/2018 Discharge date: 01/11/2018   [facility] MD order.  AVS completed.  Copy for chart, and copy for patient signed, and dated. Patient/caregiver able to verbalize understanding.  Discharge Medication: Allergies as of 01/11/2018      Reactions   Fish Oil    Pineapple       Medication List    TAKE these medications   gabapentin 400 MG capsule Commonly known as:  NEURONTIN Take 400 mg by mouth 3 (three) times daily.   guaiFENesin-dextromethorphan 100-10 MG/5ML syrup Commonly known as:  ROBITUSSIN DM Take 5 mLs by mouth every 4 (four) hours as needed for cough.   hydrochlorothiazide 25 MG tablet Commonly known as:  HYDRODIURIL Take 25 mg by mouth daily.   ipratropium-albuterol 0.5-2.5 (3) MG/3ML Soln Commonly known as:  DUONEB Take 3 mLs by nebulization every 4 (four) hours as needed.   predniSONE 50 MG tablet Commonly known as:  DELTASONE Take 1 tablet (50 mg total) by mouth daily with breakfast for 3 days.   tiotropium 18 MCG inhalation capsule Commonly known as:  SPIRIVA Place 1 capsule (18 mcg total) into inhaler and inhale every morning.   traZODone 100 MG tablet Commonly known as:  DESYREL Take 150 mg by mouth at bedtime.   vitamin C 500 MG tablet Commonly known as:  ASCORBIC ACID Take 500 mg by mouth daily.       Discharge Assessment: Vitals:   01/11/18 0257 01/11/18 0539  BP:  117/83  Pulse:  93  Resp:  18  Temp:  97.9 F (36.6 C)  SpO2: 95% 98%   Skin clean, dry and intact without evidence of skin break down, no evidence of skin tears noted. IV catheter discontinued intact. Site without signs and symptoms of complications - no redness or edema noted at insertion site, patient denies c/o pain - only slight tenderness at site.  Dressing with slight pressure applied.  D/c Instructions-Education: Discharge instructions given to patient/family with verbalized understanding. D/c education  completed with patient/family including follow up instructions, medication list, d/c activities limitations if indicated, with other d/c instructions as indicated by MD - patient able to verbalize understanding, all questions fully answered. Patient instructed to return to ED, call 911, or call MD for any changes in condition.  Patient escorted via WC, and D/C home via private auto.  Adair LaundryElizabeth A Ngai Parcell, RN 01/11/2018 11:36 AM

## 2018-01-11 NOTE — Discharge Summary (Signed)
[facility] [place] [facility]   PATIENT NAME: Parker Brown    MR#:  [id number]  DATE OF BIRTH:  1955-09-12  DATE OF ADMISSION:  01/10/2018   ADMITTING PHYSICIAN: Campbell StallKaty Dodd Chrisha Vogel, MD  DATE OF DISCHARGE: 01/11/2018 12:26 PM  PRIMARY CARE PHYSICIAN: Reid, [place], MD   ADMISSION DIAGNOSIS:  COPD exacerbation (HCC) [J44.1] DISCHARGE DIAGNOSIS:  Active Problems:   COPD exacerbation (HCC)  SECONDARY DIAGNOSIS:   Past Medical History:  Diagnosis Date  . Arthritis   . COPD (chronic obstructive pulmonary disease) (HCC)   . Hyperlipidemia   . Hypertension    HOSPITAL COURSE:   Parker Brown is a 63 year old male who presented to the ED with shortness of breath in the setting of running out of his albuterol inhaler at home.  In the ED, his oxygen saturations were 88 to 93% on room air.  Flu test was negative.  Chest x-ray was unremarkable.  He was admitted for further management.  He was treated with IV Solu-Medrol and then was transitioned to prednisone on discharge for a total 5-day course.  He was also given duo nebs and his home inhalers were continued.  On the day of discharge, he was able to ambulate without any increased work of breathing.  Oxygen saturations were in the high 90s on room air.  A new prescription for his albuterol nebulizer was called in on the day of discharge.  DISCHARGE CONDITIONS:  COPD Hyper tension Hyperlipidemia CONSULTS OBTAINED:  None DRUG ALLERGIES:   Allergies  Allergen Reactions  . Fish Oil   . Pineapple    DISCHARGE MEDICATIONS:   Allergies as of 01/11/2018      Reactions   Fish Oil    Pineapple       Medication List    TAKE these medications   gabapentin 400 MG capsule Commonly known as:  NEURONTIN Take 400 mg by mouth 3 (three) times daily.   guaiFENesin-dextromethorphan 100-10 MG/5ML syrup Commonly known as:  ROBITUSSIN DM Take 5 mLs by mouth every 4 (four) hours as needed for cough.   hydrochlorothiazide 25 MG  tablet Commonly known as:  HYDRODIURIL Take 25 mg by mouth daily.   ipratropium-albuterol 0.5-2.5 (3) MG/3ML Soln Commonly known as:  DUONEB Take 3 mLs by nebulization every 4 (four) hours as needed.   predniSONE 50 MG tablet Commonly known as:  DELTASONE Take 1 tablet (50 mg total) by mouth daily with breakfast for 3 days.   tiotropium 18 MCG inhalation capsule Commonly known as:  SPIRIVA Place 1 capsule (18 mcg total) into inhaler and inhale every morning.   traZODone 100 MG tablet Commonly known as:  DESYREL Take 150 mg by mouth at bedtime.   vitamin C 500 MG tablet Commonly known as:  ASCORBIC ACID Take 500 mg by mouth daily.        DISCHARGE INSTRUCTIONS:  1.  Follow-up with PCP in 5 days 2.  Continue prednisone daily for a total of 5 days DIET:  Cardiac diet DISCHARGE CONDITION:  Stable ACTIVITY:  Activity as tolerated OXYGEN:  Home Oxygen: No.  Oxygen Delivery: room air DISCHARGE LOCATION:  home   If you experience worsening of your admission symptoms, develop shortness of breath, life threatening emergency, suicidal or homicidal thoughts you must seek medical attention immediately by calling 911 or calling your MD immediately  if symptoms less severe.  You Must read complete instructions/literature along with all the possible adverse reactions/side effects for all the Medicines you  take and that have been prescribed to you. Take any new Medicines after you have completely understood and accpet all the possible adverse reactions/side effects.   Please note  You were cared for by a hospitalist during your hospital stay. If you have any questions about your discharge medications or the care you received while you were in the hospital after you are discharged, you can call the unit and asked to speak with the hospitalist on call if the hospitalist that took care of you is not available. Once you are discharged, your primary care physician will handle any further  medical issues. Please note that NO REFILLS for any discharge medications will be authorized once you are discharged, as it is imperative that you return to your primary care physician (or establish a relationship with a primary care physician if you do not have one) for your aftercare needs so that they can reassess your need for medications and monitor your lab values.    On the day of Discharge:  VITAL SIGNS:  Blood pressure 117/83, pulse 93, temperature 97.9 F (36.6 C), temperature source Oral, resp. rate 18, height 5\' 6"  (1.676 m), weight 70.3 kg, SpO2 98 %. PHYSICAL EXAMINATION:  GENERAL:  63 y.o.-year-old patient lying in the bed with no acute distress.  EYES: Pupils equal, round, reactive to light and accommodation. No scleral icterus. Extraocular muscles intact.  HEENT: Head atraumatic, normocephalic. Oropharynx and nasopharynx clear.  NECK:  Supple, no jugular venous distention. No thyroid enlargement, no tenderness.  LUNGS: + Mild diffuse expiratory wheezing present, no rales,rhonchi or crepitation. No use of accessory muscles of respiration.  CARDIOVASCULAR: S1, S2 normal. No murmurs, rubs, or gallops.  ABDOMEN: Soft, non-tender, non-distended. Bowel sounds present. No organomegaly or mass.  EXTREMITIES: No pedal edema, cyanosis, or clubbing.  NEUROLOGIC: Cranial nerves II through XII are intact. Muscle strength 5/5 in all extremities. Sensation intact. Gait not checked.  PSYCHIATRIC: The patient is alert and oriented x 3.  SKIN: No obvious rash, lesion, or ulcer.  DATA REVIEW:   CBC Recent Labs  Lab 01/11/18 0549  WBC 9.0  HGB 12.9*  HCT 39.4  PLT 225    Chemistries  Recent Labs  Lab 01/10/18 0535  01/11/18 0549  NA 135  --  137  K 4.0  --  4.0  CL 104  --  105  CO2 26  --  22  GLUCOSE 109*  --  139*  BUN 18  --  20  CREATININE 1.26*   < > 1.28*  CALCIUM 8.7*  --  9.3  AST 43*  --   --   ALT 33  --   --   ALKPHOS 54  --   --   BILITOT 0.9  --   --    < >  = values in this interval not displayed.     Microbiology Results  No results found for this or any previous visit.  RADIOLOGY:  No results found.   Management plans discussed with the patient, family and they are in agreement.  CODE STATUS: Full Code   TOTAL TIME TAKING CARE OF THIS PATIENT: 35 minutes.    Jinny Blossom Palin Tristan M.D on 01/11/2018 at 3:26 PM  Between 7am to 6pm - Pager [phone]  After 6pm go to [email or web address] - Social research officer, government  [facility] [place] [facility]  Office  [phone]  CC: Primary care physician; Reid, [place], MD   Note: This dictation was prepared with Dragon dictation along  with smaller phrase technology. Any transcriptional errors that result from this process are unintentional.

## 2018-01-13 LAB — BLOOD GAS, VENOUS
ACID-BASE EXCESS: 2 mmol/L (ref 0.0–2.0)
BICARBONATE: 29.4 mmol/L — AB (ref 20.0–28.0)
O2 Saturation: 53.3 %
Patient temperature: 37
pCO2, Ven: 57 mmHg (ref 44.0–60.0)
pH, Ven: 7.32 (ref 7.250–7.430)

## 2018-02-17 ENCOUNTER — Other Ambulatory Visit: Payer: Self-pay

## 2018-02-17 ENCOUNTER — Emergency Department: Payer: Non-veteran care

## 2018-02-17 ENCOUNTER — Encounter: Payer: Self-pay | Admitting: Emergency Medicine

## 2018-02-17 ENCOUNTER — Emergency Department
Admission: EM | Admit: 2018-02-17 | Discharge: 2018-02-18 | Disposition: A | Discharge status: Other Acute Inpatient Hospital | Payer: Non-veteran care | Attending: Emergency Medicine | Admitting: Emergency Medicine

## 2018-02-17 DIAGNOSIS — J441 Chronic obstructive pulmonary disease with (acute) exacerbation: Secondary | ICD-10-CM | POA: Insufficient documentation

## 2018-02-17 DIAGNOSIS — Z79899 Other long term (current) drug therapy: Secondary | ICD-10-CM | POA: Insufficient documentation

## 2018-02-17 DIAGNOSIS — J111 Influenza due to unidentified influenza virus with other respiratory manifestations: Secondary | ICD-10-CM | POA: Insufficient documentation

## 2018-02-17 DIAGNOSIS — F17210 Nicotine dependence, cigarettes, uncomplicated: Secondary | ICD-10-CM | POA: Insufficient documentation

## 2018-02-17 DIAGNOSIS — I10 Essential (primary) hypertension: Secondary | ICD-10-CM | POA: Insufficient documentation

## 2018-02-17 LAB — CBC
HCT: 44.1 % (ref 39.0–52.0)
Hemoglobin: 14.8 g/dL (ref 13.0–17.0)
MCH: 30 pg (ref 26.0–34.0)
MCHC: 33.6 g/dL (ref 30.0–36.0)
MCV: 89.3 fL (ref 80.0–100.0)
Platelets: 203 10*3/uL (ref 150–400)
RBC: 4.94 MIL/uL (ref 4.22–5.81)
RDW: 14.2 % (ref 11.5–15.5)
WBC: 6.7 10*3/uL (ref 4.0–10.5)
nRBC: 0 % (ref 0.0–0.2)

## 2018-02-17 LAB — BASIC METABOLIC PANEL
Anion gap: 10 (ref 5–15)
BUN: 20 mg/dL (ref 8–23)
CO2: 23 mmol/L (ref 22–32)
Calcium: 8.9 mg/dL (ref 8.9–10.3)
Chloride: 101 mmol/L (ref 98–111)
Creatinine, Ser: 1.42 mg/dL — ABNORMAL HIGH (ref 0.61–1.24)
GFR calc non Af Amer: 52 mL/min — ABNORMAL LOW (ref >60)
Glucose, Bld: 118 mg/dL — ABNORMAL HIGH (ref 70–99)
Potassium: 3.7 mmol/L (ref 3.5–5.1)
SODIUM: 134 mmol/L — AB (ref 135–145)

## 2018-02-17 LAB — INFLUENZA PANEL BY PCR (TYPE A & B)
INFLBPCR: NEGATIVE
Influenza A By PCR: POSITIVE — AB

## 2018-02-17 MED ORDER — SODIUM CHLORIDE 0.9% FLUSH: 3.0000 mL | Freq: Once | INTRAVENOUS | Status: DC

## 2018-02-17 MED ORDER — ALBUTEROL SULFATE (2.5 MG/3ML) 0.083% IN NEBU
5.0000 mg | INHALATION_SOLUTION | Freq: Once | RESPIRATORY_TRACT | Status: AC
  Administered 2018-02-17: 5 mg via RESPIRATORY_TRACT

## 2018-02-17 MED ORDER — IBUPROFEN 800 MG PO TABS
ORAL_TABLET | ORAL | Status: AC
  Filled 2018-02-17: qty 1

## 2018-02-17 MED ORDER — IPRATROPIUM-ALBUTEROL 0.5-2.5 (3) MG/3ML IN SOLN
9.0000 mL | Freq: Once | RESPIRATORY_TRACT | Status: AC
  Administered 2018-02-17: 9 mL via RESPIRATORY_TRACT
  Filled 2018-02-17: qty 3

## 2018-02-17 MED ORDER — AZITHROMYCIN 500 MG PO TABS
500.0000 mg | ORAL_TABLET | Freq: Once | ORAL | Status: AC
  Administered 2018-02-17: 500 mg via ORAL
  Filled 2018-02-17: qty 1

## 2018-02-17 MED ORDER — ALBUTEROL SULFATE (2.5 MG/3ML) 0.083% IN NEBU
5.0000 mg | INHALATION_SOLUTION | Freq: Once | RESPIRATORY_TRACT | Status: AC
  Administered 2018-02-17: 5 mg via RESPIRATORY_TRACT
  Filled 2018-02-17: qty 6

## 2018-02-17 MED ORDER — ALBUTEROL SULFATE (2.5 MG/3ML) 0.083% IN NEBU
INHALATION_SOLUTION | RESPIRATORY_TRACT | Status: AC
  Administered 2018-02-17: 5 mg via RESPIRATORY_TRACT
  Filled 2018-02-17: qty 6

## 2018-02-17 MED ORDER — IBUPROFEN 800 MG PO TABS
800.0000 mg | ORAL_TABLET | Freq: Once | ORAL | Status: AC
  Administered 2018-02-17: 800 mg via ORAL

## 2018-02-17 MED ORDER — PREDNISONE 20 MG PO TABS
40.0000 mg | ORAL_TABLET | Freq: Once | ORAL | Status: AC
  Administered 2018-02-17: 40 mg via ORAL
  Filled 2018-02-17: qty 2

## 2018-02-17 NOTE — ED Notes (Signed)
Ambulated pt per Schaevitz, MD. Pt's oxygen level remained at 100% notified MD.

## 2018-02-17 NOTE — ED Triage Notes (Signed)
Pt in via POV with complaints of shortness of breath, cough, generalized weakness x 3 days.  Pt with low grade fever upon arrival.  Dyspnea noted at rest.

## 2018-02-17 NOTE — ED Notes (Signed)
Pt provided mask, advised to keep on until he is roomed.

## 2018-02-17 NOTE — ED Provider Notes (Addendum)
[facility] Emergency Department Provider Note  ____________________________________________   First MD Initiated Contact with Patient 02/17/18 1821     (approximate)  I have reviewed the triage vital signs and the nursing notes.   HISTORY  Chief Complaint Shortness of Breath   HPI Parker Brown is a 63 y.o. male with a history of COPD as well as hyperlipidemia and hypertension who still smokes approximately 2 cigarettes/day who is presented emergency department today complaining of fever, body aches and shortness of breath with her cough since this past Sunday, 4 days ago.  He states that his symptoms seem to be getting worse and this is why he presented to the hospital today.  Denying any chest pain.   Past Medical History:  Diagnosis Date  . Arthritis   . COPD (chronic obstructive pulmonary disease) (HCC)   . Hyperlipidemia   . Hypertension     Patient Active Problem List   Diagnosis Date Noted  . COPD exacerbation (HCC) 01/10/2018  . COPD with acute exacerbation (HCC) 06/09/2017  . HTN (hypertension) 06/09/2017  . HLD (hyperlipidemia) 06/09/2017    Past Surgical History:  Procedure Laterality Date  . NO PAST SURGERIES      Prior to Admission medications   Medication Sig Start Date End Date Taking? Authorizing Provider  guaiFENesin-dextromethorphan (ROBITUSSIN DM) 100-10 MG/5ML syrup Take 5 mLs by mouth every 4 (four) hours as needed for cough. 06/11/17  Yes Mody, Patricia PesaSital, MD  hydrochlorothiazide (HYDRODIURIL) 25 MG tablet Take 25 mg by mouth daily.   Yes [provider]  tiotropium (SPIRIVA) 18 MCG inhalation capsule Place 1 capsule (18 mcg total) into inhaler and inhale every morning. 06/12/17  Yes Adrian SaranMody, Sital, MD  traZODone (DESYREL) 100 MG tablet Take 150 mg by mouth at bedtime.   Yes [provider]  vitamin C (ASCORBIC ACID) 500 MG tablet Take 500 mg by mouth daily.   Yes [provider]  gabapentin (NEURONTIN)  400 MG capsule Take 400 mg by mouth 3 (three) times daily.    [provider]  ipratropium-albuterol (DUONEB) 0.5-2.5 (3) MG/3ML SOLN Take 3 mLs by nebulization every 4 (four) hours as needed. 01/11/18   Mayo, Allyn KennerKaty Dodd, MD    Allergies Fish oil and Pineapple  No family history on file.  Social History Social History   Tobacco Use  . Smoking status: Current Every Day Smoker    Packs/day: 0.25    Types: Cigarettes  . Smokeless tobacco: Never Used  Substance Use Topics  . Alcohol use: Never    Frequency: Never  . Drug use: Yes    Types: Marijuana    Review of Systems  Constitutional: As above Eyes: No visual changes. ENT: No sore throat. Cardiovascular: Denies chest pain. Respiratory: As above Gastrointestinal: No abdominal pain.  No nausea, no vomiting.  No diarrhea.  No constipation. Genitourinary: Negative for dysuria. Musculoskeletal: Myalgias, generalized Skin: Negative for rash. Neurological: Negative for headaches, focal weakness or numbness.   ____________________________________________   PHYSICAL EXAM:  VITAL SIGNS: ED Triage Vitals  Enc Vitals Group     BP 02/17/18 1533 (!) 141/89     Pulse Rate 02/17/18 1533 (!) 102     Resp 02/17/18 1533 (!) 36     Temp 02/17/18 1533 (!) 100.7 F (38.2 C)     Temp Source 02/17/18 1533 Oral     SpO2 02/17/18 1532 96 %     Weight 02/17/18 1535 150 lb (68 kg)  Height 02/17/18 1535 5\' 6"  (1.676 m)     Head Circumference --      Peak Flow --      Pain Score 02/17/18 1533 7     Pain Loc --      Pain Edu? --      Excl. in GC? --     Constitutional: Alert and oriented. Well appearing and in no acute distress.  Speaking in full sentences. Eyes: Conjunctivae are normal.  Head: Atraumatic. Nose: No congestion/rhinnorhea. Mouth/Throat: Mucous membranes are moist.  Neck: No stridor.   Cardiovascular: Normal rate, regular rhythm. Grossly normal heart sounds.   Respiratory: Labored respirations with  intercostal retractions.  Not tripoding.  Decreased air movement throughout with a prolonged expiratory phase. Gastrointestinal: Soft and nontender. No distention.  Musculoskeletal: No lower extremity tenderness nor edema.  No joint effusions. Neurologic:  Normal speech and language. No gross focal neurologic deficits are appreciated. Skin:  Skin is warm, dry and intact. No rash noted. Psychiatric: Mood and affect are normal. Speech and behavior are normal.  ____________________________________________   LABS (all labs ordered are listed, but only abnormal results are displayed)  Labs Reviewed  BASIC METABOLIC PANEL - Abnormal; Notable for the following components:      Result Value   Sodium 134 (*)    Glucose, Bld 118 (*)    Creatinine, Ser 1.42 (*)    GFR calc non Af Amer 52 (*)    All other components within normal limits  INFLUENZA PANEL BY PCR (TYPE A & B) - Abnormal; Notable for the following components:   Influenza A By PCR POSITIVE (*)    All other components within normal limits  CBC   ____________________________________________  EKG  ED ECG REPORT I, Arelia Longest, the attending physician, personally viewed and interpreted this ECG.   Date: 02/17/2018  EKG Time: 1535  Rate: 102  Rhythm: sinus tachycardia  Axis: Normal  Intervals:none  ST&T Change: No ST segment elevation or depression.  No abnormal T wave inversion.  ____________________________________________  RADIOLOGY  Chest x-ray without active cardiopulmonary disease ____________________________________________   PROCEDURES  Procedure(s) performed:   Procedures  Critical Care performed:   ____________________________________________   INITIAL IMPRESSION / ASSESSMENT AND PLAN / ED COURSE  Pertinent labs & imaging results that were available during my care of the patient were reviewed by me and considered in my medical decision making (see chart for details).  Differential includes,  but is not limited to, viral syndrome, bronchitis including COPD exacerbation, pneumonia, reactive airway disease including asthma, CHF including exacerbation with or without pulmonary/interstitial edema, pneumothorax, ACS, thoracic trauma, and pulmonary embolism. As part of my medical decision making, I reviewed the following data within the electronic MEDICAL RECORD NUMBER Notes from prior ED visits  ----------------------------------------- 8:04 PM on 02/17/2018 -----------------------------------------  Patient now with wheezing throughout all fields.  But still with intercostal retractions.  Believe the wheezing is from improved air movement secondary to bronchodilators.  However, patient will need further treatment.  Ordered duo nebs this time and will reassess.  ----------------------------------------- 9:30 PM on 02/17/2018 -----------------------------------------  Patient walked by ER tech and with labored respirations.  Persistent wheezing on exam now.  Patient also saying that he has a cramp in his side.  Patient will be admitted to the hospital.  He is out of the window for Tamiflu, unfortunately.  Signed out to Dr. Adora Fridge.  He is aware of need for admission to the hospital and willing to comply. ____________________________________________  FINAL CLINICAL IMPRESSION(S) / ED DIAGNOSES  COPD exacerbation.  Influenza.  NEW MEDICATIONS STARTED DURING THIS VISIT:  New Prescriptions   No medications on file     Note:  This document was prepared using Dragon voice recognition software and may include unintentional dictation errors.     Myrna Blazer, MD 02/17/18 2132  [facility] and even though he was admitted here the last 2 times will attempt to transfer him to the [place] at this time.  He is aware of the need for transfer.  Paperwork to be completed.    Myrna Blazer, MD 02/17/18 2258

## 2018-02-18 LAB — BLOOD GAS, VENOUS
Acid-base deficit: 0.4 mmol/L (ref 0.0–2.0)
Bicarbonate: 22.9 mmol/L (ref 20.0–28.0)
O2 Saturation: 95.2 %
PO2 VEN: 73 mmHg — AB (ref 32.0–45.0)
Patient temperature: 37
pCO2, Ven: 33 mmHg — ABNORMAL LOW (ref 44.0–60.0)
pH, Ven: 7.45 — ABNORMAL HIGH (ref 7.250–7.430)

## 2018-02-18 MED ORDER — ALBUTEROL SULFATE (2.5 MG/3ML) 0.083% IN NEBU
2.5000 mg | INHALATION_SOLUTION | Freq: Once | RESPIRATORY_TRACT | Status: AC
  Administered 2018-02-18: 2.5 mg via RESPIRATORY_TRACT
  Filled 2018-02-18: qty 3

## 2018-02-18 MED ORDER — OSELTAMIVIR PHOSPHATE 75 MG PO CAPS
75.0000 mg | ORAL_CAPSULE | Freq: Once | ORAL | Status: AC
  Administered 2018-02-18: 75 mg via ORAL
  Filled 2018-02-18: qty 1

## 2018-02-18 NOTE — ED Provider Notes (Signed)
I assumed care of the patient from Dr. Pershing ProudSchaevitz at 11:00 PM.  Patient evaluated with expiratory wheezing noted however no increased work of breathing.  Patient's oxygen saturation 99% on room air.  [facility]   Darci CurrentBrown, [place] N, MD 02/18/18 [phone]

## 2018-02-18 NOTE — ED Notes (Signed)
EMTALA checked for completion  

## 2019-02-20 ENCOUNTER — Emergency Department
Admission: EM | Admit: 2019-02-20 | Discharge: 2019-02-20 | Disposition: A | Discharge status: Home / Self Care | Payer: No Typology Code available for payment source | Attending: Emergency Medicine | Admitting: Emergency Medicine

## 2019-02-20 ENCOUNTER — Other Ambulatory Visit: Payer: Self-pay

## 2019-02-20 DIAGNOSIS — F1210 Cannabis abuse, uncomplicated: Secondary | ICD-10-CM | POA: Insufficient documentation

## 2019-02-20 DIAGNOSIS — Y929 Unspecified place or not applicable: Secondary | ICD-10-CM | POA: Insufficient documentation

## 2019-02-20 DIAGNOSIS — J449 Chronic obstructive pulmonary disease, unspecified: Secondary | ICD-10-CM | POA: Diagnosis not present

## 2019-02-20 DIAGNOSIS — Y999 Unspecified external cause status: Secondary | ICD-10-CM | POA: Insufficient documentation

## 2019-02-20 DIAGNOSIS — Z23 Encounter for immunization: Secondary | ICD-10-CM | POA: Diagnosis not present

## 2019-02-20 DIAGNOSIS — Y939 Activity, unspecified: Secondary | ICD-10-CM | POA: Diagnosis not present

## 2019-02-20 DIAGNOSIS — I10 Essential (primary) hypertension: Secondary | ICD-10-CM | POA: Insufficient documentation

## 2019-02-20 DIAGNOSIS — F17210 Nicotine dependence, cigarettes, uncomplicated: Secondary | ICD-10-CM | POA: Diagnosis not present

## 2019-02-20 DIAGNOSIS — S61411A Laceration without foreign body of right hand, initial encounter: Secondary | ICD-10-CM | POA: Insufficient documentation

## 2019-02-20 DIAGNOSIS — Z79899 Other long term (current) drug therapy: Secondary | ICD-10-CM | POA: Diagnosis not present

## 2019-02-20 MED ORDER — LIDOCAINE HCL (PF) 1 % IJ SOLN
INTRAMUSCULAR | Status: AC
  Filled 2019-02-20: qty 5

## 2019-02-20 MED ORDER — TETANUS-DIPHTH-ACELL PERTUSSIS 5-2.5-18.5 LF-MCG/0.5 IM SUSP
INTRAMUSCULAR | Status: AC
  Filled 2019-02-20: qty 0.5

## 2019-02-20 MED ORDER — LIDOCAINE HCL (PF) 1 % IJ SOLN
5.0000 mL | Freq: Once | INTRAMUSCULAR | Status: AC
  Administered 2019-02-20: 5 mL

## 2019-02-20 MED ORDER — TETANUS-DIPHTH-ACELL PERTUSSIS 5-2.5-18.5 LF-MCG/0.5 IM SUSP
0.5000 mL | Freq: Once | INTRAMUSCULAR | Status: AC
  Administered 2019-02-20: 0.5 mL via INTRAMUSCULAR
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
## 2019-02-20 NOTE — Discharge Instructions (Addendum)
Please follow-up with your doctor in 7 to 10 days for suture removal.  Please keep the area clean and covered for the next 48 hours.  After which you may remove the bandage and clean gently with warm water and soap.  Allow to air dry and then recover with a bandage every 48 hours.  Return to the emergency department for any signs of infection such as increased redness pus or fever.
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
## 2019-02-20 NOTE — ED Notes (Signed)
Laceration dressed with sterile gauze and kerlix.

## 2019-09-12 IMAGING — CR DG CHEST 2V
1 series · 2 of 2 positions shown · non-contrast
Comparison: 01/10/2018.

CLINICAL DATA: Shortness of breath. Cough with generalized
weakness. COPD.

EXAM:
CHEST - 2 VIEW

[Series 1: dg chest 2 view · 0.14mm/px · 2 of 2 slices shown]
[im 1/2]
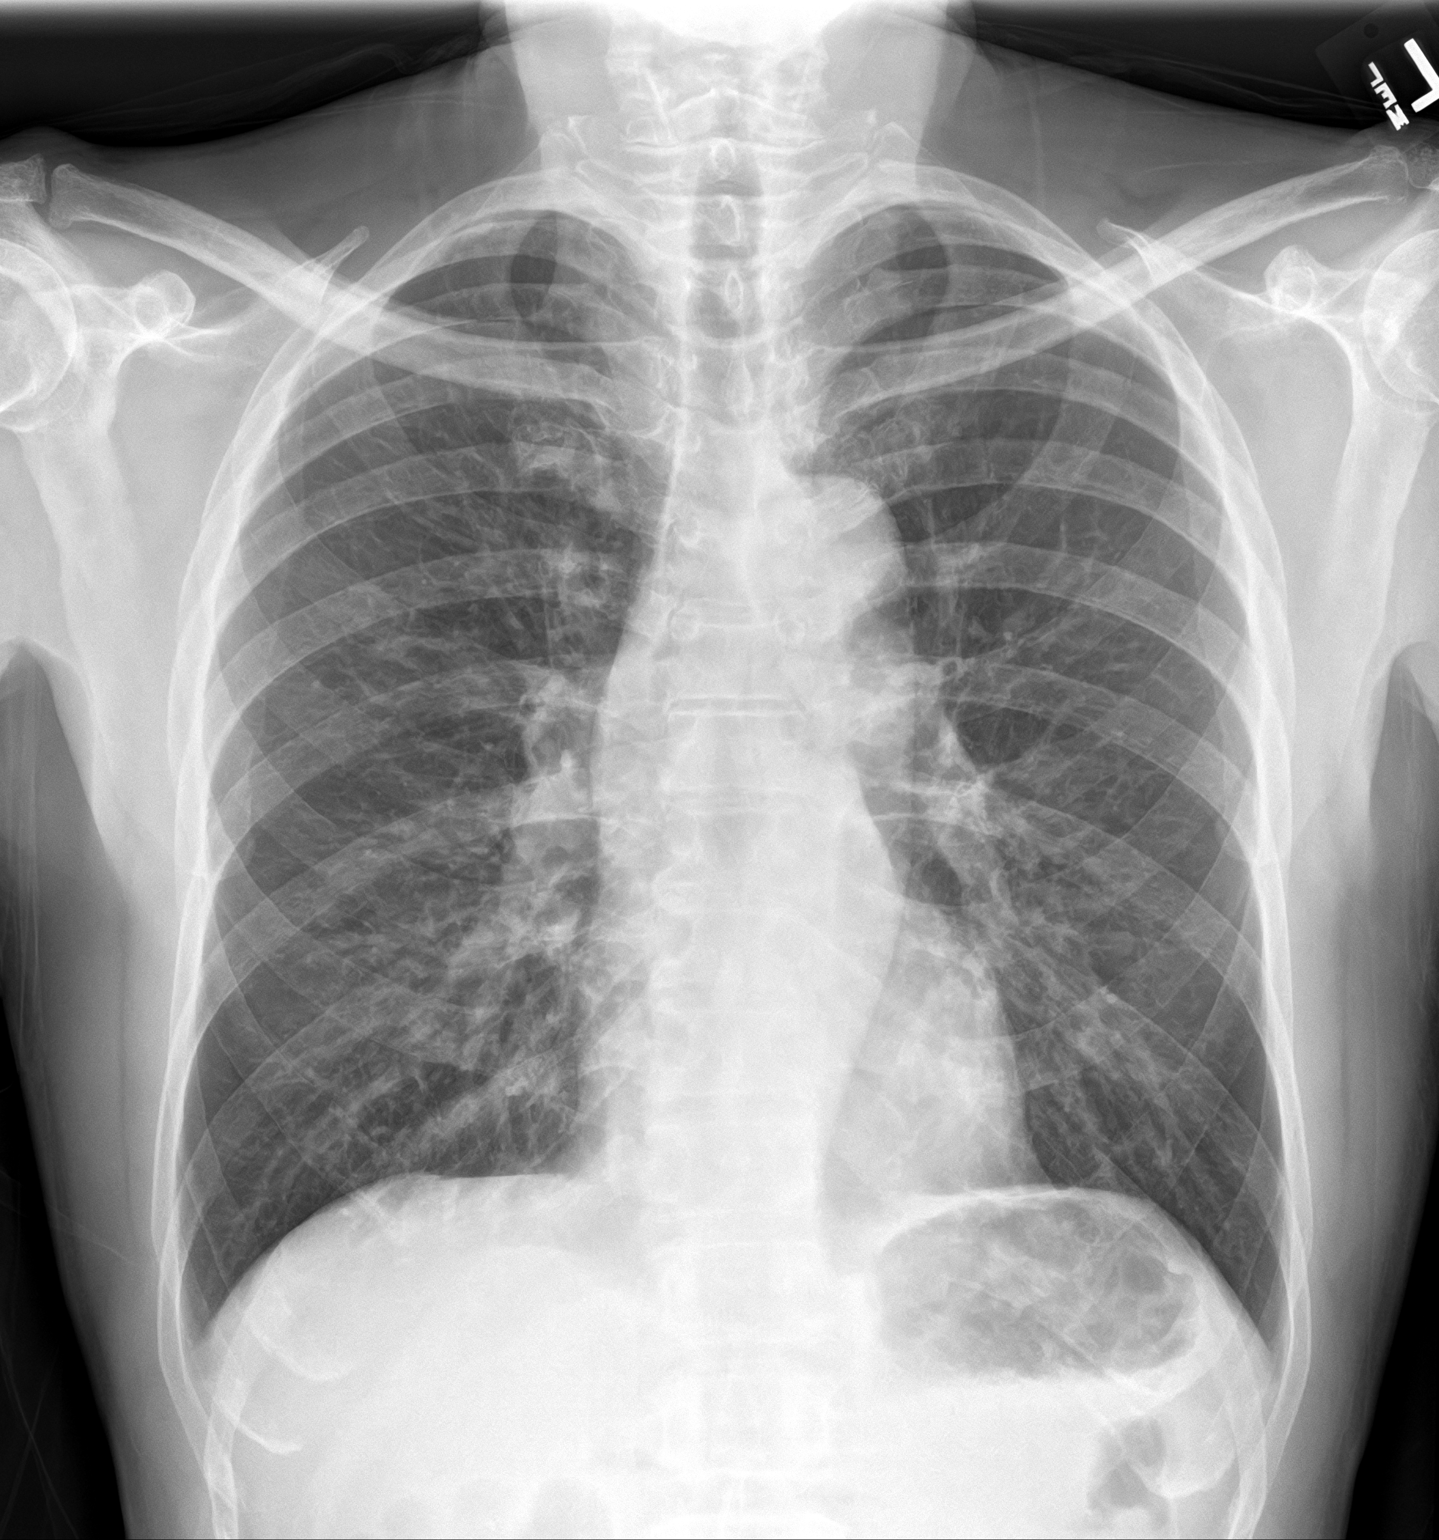
[im 2/2]
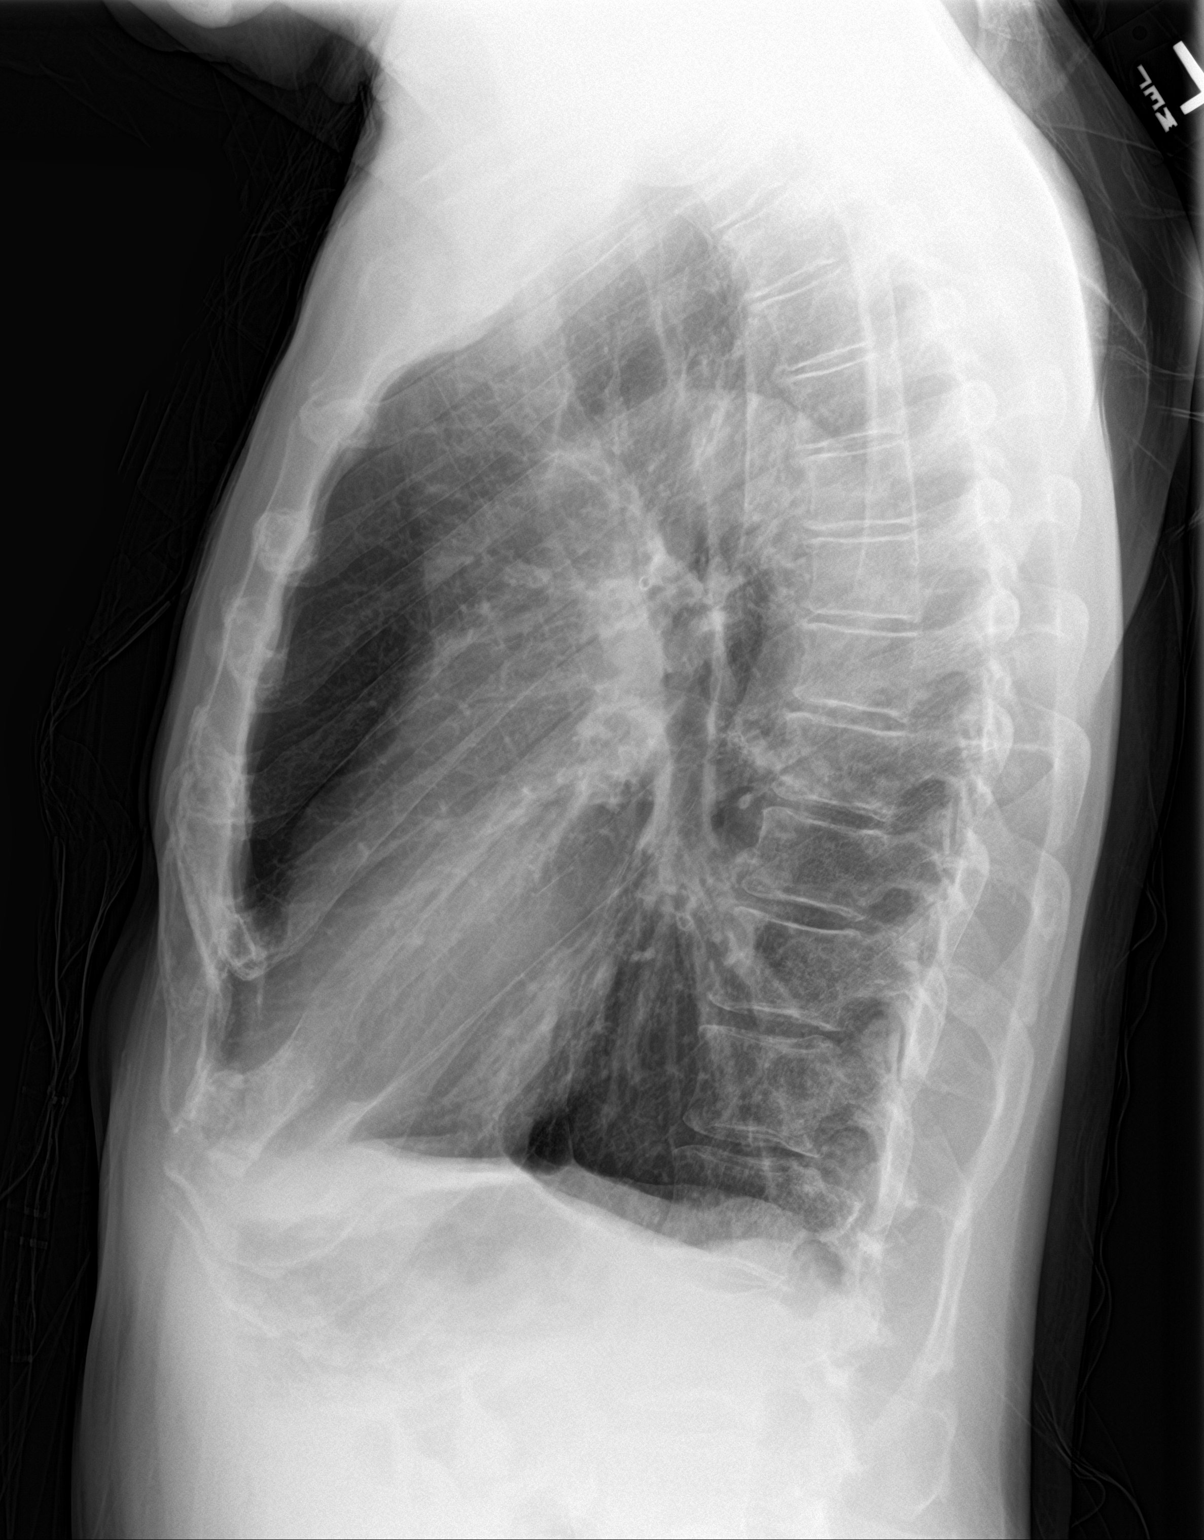

[2 of 2 positions shown; findings below may reference images not displayed]

FINDINGS: The heart size and mediastinal contours are within normal limits.
Both lungs are clear. The visualized skeletal structures are
unremarkable.
IMPRESSION: No active cardiopulmonary disease.

## 2022-03-15 ENCOUNTER — Emergency Department: Payer: No Typology Code available for payment source

## 2022-03-15 ENCOUNTER — Observation Stay
Admission: EM | Admit: 2022-03-15 | Discharge: 2022-03-17 | Disposition: A | Discharge status: Home / Self Care | Payer: No Typology Code available for payment source | Attending: Internal Medicine | Admitting: Internal Medicine

## 2022-03-15 ENCOUNTER — Other Ambulatory Visit: Payer: Self-pay

## 2022-03-15 DIAGNOSIS — E785 Hyperlipidemia, unspecified: Secondary | ICD-10-CM | POA: Diagnosis present

## 2022-03-15 DIAGNOSIS — Z87891 Personal history of nicotine dependence: Secondary | ICD-10-CM

## 2022-03-15 DIAGNOSIS — R0602 Shortness of breath: Secondary | ICD-10-CM | POA: Diagnosis present

## 2022-03-15 DIAGNOSIS — I10 Essential (primary) hypertension: Secondary | ICD-10-CM | POA: Diagnosis not present

## 2022-03-15 DIAGNOSIS — Z1152 Encounter for screening for COVID-19: Secondary | ICD-10-CM | POA: Diagnosis not present

## 2022-03-15 DIAGNOSIS — F17210 Nicotine dependence, cigarettes, uncomplicated: Secondary | ICD-10-CM | POA: Insufficient documentation

## 2022-03-15 DIAGNOSIS — F1910 Other psychoactive substance abuse, uncomplicated: Secondary | ICD-10-CM | POA: Insufficient documentation

## 2022-03-15 DIAGNOSIS — J441 Chronic obstructive pulmonary disease with (acute) exacerbation: Secondary | ICD-10-CM | POA: Diagnosis not present

## 2022-03-15 DIAGNOSIS — R079 Chest pain, unspecified: Secondary | ICD-10-CM | POA: Diagnosis not present

## 2022-03-15 DIAGNOSIS — Z79899 Other long term (current) drug therapy: Secondary | ICD-10-CM | POA: Insufficient documentation

## 2022-03-15 DIAGNOSIS — R0789 Other chest pain: Secondary | ICD-10-CM | POA: Insufficient documentation

## 2022-03-15 DIAGNOSIS — G4700 Insomnia, unspecified: Secondary | ICD-10-CM | POA: Diagnosis not present

## 2022-03-15 LAB — RESP PANEL BY RT-PCR (RSV, FLU A&B, COVID)  RVPGX2
Influenza A by PCR: NEGATIVE
Influenza B by PCR: NEGATIVE
Resp Syncytial Virus by PCR: NEGATIVE
SARS Coronavirus 2 by RT PCR: NEGATIVE

## 2022-03-15 LAB — PROCALCITONIN: Procalcitonin: <0.1 ng/mL

## 2022-03-15 LAB — COMPREHENSIVE METABOLIC PANEL
ALT: 13 U/L (ref 0–44)
AST: 25 U/L (ref 15–41)
Albumin: 4.1 g/dL (ref 3.5–5.0)
Alkaline Phosphatase: 61 U/L (ref 38–126)
Anion gap: 9 (ref 5–15)
BUN: 14 mg/dL (ref 8–23)
CO2: 27 mmol/L (ref 22–32)
Calcium: 9.2 mg/dL (ref 8.9–10.3)
Chloride: 99 mmol/L (ref 98–111)
Creatinine, Ser: 1.09 mg/dL (ref 0.61–1.24)
GFR, Estimated: >60 mL/min (ref >60)
Glucose, Bld: 130 mg/dL — ABNORMAL HIGH (ref 70–99)
Potassium: 3.7 mmol/L (ref 3.5–5.1)
Sodium: 135 mmol/L (ref 135–145)
Total Bilirubin: 0.4 mg/dL (ref 0.3–1.2)
Total Protein: 8.6 g/dL — ABNORMAL HIGH (ref 6.5–8.1)

## 2022-03-15 LAB — URINE DRUG SCREEN, QUALITATIVE (ARMC ONLY)
Amphetamines, Ur Screen: NOT DETECTED
Barbiturates, Ur Screen: NOT DETECTED
Benzodiazepine, Ur Scrn: NOT DETECTED
Cannabinoid 50 Ng, Ur ~~LOC~~: POSITIVE — AB
Cocaine Metabolite,Ur ~~LOC~~: POSITIVE — AB
MDMA (Ecstasy)Ur Screen: NOT DETECTED
Methadone Scn, Ur: NOT DETECTED
Opiate, Ur Screen: NOT DETECTED
Phencyclidine (PCP) Ur S: NOT DETECTED
Tricyclic, Ur Screen: NOT DETECTED

## 2022-03-15 LAB — CBC
HCT: 45.4 % (ref 39.0–52.0)
Hemoglobin: 14.8 g/dL (ref 13.0–17.0)
MCH: 29.7 pg (ref 26.0–34.0)
MCHC: 32.6 g/dL (ref 30.0–36.0)
MCV: 91 fL (ref 80.0–100.0)
Platelets: 290 10*3/uL (ref 150–400)
RBC: 4.99 MIL/uL (ref 4.22–5.81)
RDW: 13.7 % (ref 11.5–15.5)
WBC: 7.3 10*3/uL (ref 4.0–10.5)
nRBC: 0 % (ref 0.0–0.2)

## 2022-03-15 LAB — BRAIN NATRIURETIC PEPTIDE: B Natriuretic Peptide: 26 pg/mL (ref 0.0–100.0)

## 2022-03-15 MED ORDER — MOMETASONE FURO-FORMOTEROL FUM 200-5 MCG/ACT IN AERO
2.0000 | INHALATION_SPRAY | Freq: Two times a day (BID) | RESPIRATORY_TRACT | Status: DC
  Administered 2022-03-15 – 2022-03-17 (×4): 2 via RESPIRATORY_TRACT
  Filled 2022-03-15: qty 8.8

## 2022-03-15 MED ORDER — DULOXETINE HCL 30 MG PO CPEP
30.0000 mg | ORAL_CAPSULE | Freq: Two times a day (BID) | ORAL | Status: DC
  Administered 2022-03-15 – 2022-03-17 (×4): 30 mg via ORAL
  Filled 2022-03-15 (×4): qty 1

## 2022-03-15 MED ORDER — TRAZODONE HCL 100 MG PO TABS
100.0000 mg | ORAL_TABLET | Freq: Every day | ORAL | Status: DC
  Administered 2022-03-15 – 2022-03-16 (×2): 100 mg via ORAL
  Filled 2022-03-15 (×2): qty 1

## 2022-03-15 MED ORDER — METHYLPREDNISOLONE SODIUM SUCC 40 MG IJ SOLR
40.0000 mg | Freq: Every day | INTRAMUSCULAR | Status: AC
  Administered 2022-03-16: 40 mg via INTRAVENOUS
  Filled 2022-03-15: qty 1

## 2022-03-15 MED ORDER — ACETAMINOPHEN 325 MG PO TABS: 650.0000 mg | ORAL_TABLET | Freq: Four times a day (QID) | ORAL | Status: DC | PRN

## 2022-03-15 MED ORDER — AMLODIPINE BESYLATE 10 MG PO TABS
10.0000 mg | ORAL_TABLET | Freq: Every day | ORAL | Status: DC
  Administered 2022-03-15 – 2022-03-17 (×3): 10 mg via ORAL
  Filled 2022-03-15 (×3): qty 1

## 2022-03-15 MED ORDER — IPRATROPIUM-ALBUTEROL 0.5-2.5 (3) MG/3ML IN SOLN
3.0000 mL | Freq: Once | RESPIRATORY_TRACT | Status: AC
  Administered 2022-03-15: 3 mL via RESPIRATORY_TRACT
  Filled 2022-03-15: qty 3

## 2022-03-15 MED ORDER — PRAZOSIN HCL 2 MG PO CAPS
3.0000 mg | ORAL_CAPSULE | Freq: Every day | ORAL | Status: DC
  Administered 2022-03-15 – 2022-03-16 (×2): 3 mg via ORAL
  Filled 2022-03-15 (×2): qty 1

## 2022-03-15 MED ORDER — LABETALOL HCL 5 MG/ML IV SOLN
20.0000 mg | Freq: Once | INTRAVENOUS | Status: AC
  Administered 2022-03-15: 20 mg via INTRAVENOUS
  Filled 2022-03-15: qty 4

## 2022-03-15 MED ORDER — IPRATROPIUM-ALBUTEROL 0.5-2.5 (3) MG/3ML IN SOLN
3.0000 mL | Freq: Four times a day (QID) | RESPIRATORY_TRACT | Status: DC
  Administered 2022-03-15 – 2022-03-16 (×3): 3 mL via RESPIRATORY_TRACT
  Filled 2022-03-15 (×3): qty 3

## 2022-03-15 MED ORDER — ONDANSETRON HCL 4 MG/2ML IJ SOLN: 4.0000 mg | Freq: Four times a day (QID) | INTRAMUSCULAR | Status: DC | PRN

## 2022-03-15 MED ORDER — MORPHINE SULFATE (PF) 4 MG/ML IV SOLN: 4.0000 mg | INTRAVENOUS | Status: DC | PRN

## 2022-03-15 MED ORDER — LORAZEPAM 2 MG/ML PO CONC
0.5000 mg | Freq: Once | ORAL | Status: AC
  Administered 2022-03-15: 0.5 mg via ORAL
  Filled 2022-03-15: qty 0.25

## 2022-03-15 MED ORDER — GABAPENTIN 300 MG PO CAPS
300.0000 mg | ORAL_CAPSULE | Freq: Three times a day (TID) | ORAL | Status: DC
  Administered 2022-03-15 – 2022-03-17 (×6): 300 mg via ORAL
  Filled 2022-03-15 (×6): qty 1

## 2022-03-15 MED ORDER — ENOXAPARIN SODIUM 40 MG/0.4ML IJ SOSY
40.0000 mg | PREFILLED_SYRINGE | INTRAMUSCULAR | Status: DC
  Administered 2022-03-15 – 2022-03-16 (×2): 40 mg via SUBCUTANEOUS
  Filled 2022-03-15 (×2): qty 0.4

## 2022-03-15 MED ORDER — METHYLPREDNISOLONE SODIUM SUCC 125 MG IJ SOLR
125.0000 mg | Freq: Once | INTRAMUSCULAR | Status: AC
  Administered 2022-03-15: 125 mg via INTRAVENOUS
  Filled 2022-03-15: qty 2

## 2022-03-15 MED ORDER — GUAIFENESIN ER 600 MG PO TB12
600.0000 mg | ORAL_TABLET | Freq: Two times a day (BID) | ORAL | Status: DC | PRN
  Filled 2022-03-15: qty 1

## 2022-03-15 MED ORDER — NICOTINE 21 MG/24HR TD PT24: 21.0000 mg | MEDICATED_PATCH | Freq: Every day | TRANSDERMAL | Status: DC | PRN

## 2022-03-15 MED ORDER — LABETALOL HCL 5 MG/ML IV SOLN: 5.0000 mg | INTRAVENOUS | Status: DC | PRN

## 2022-03-15 MED ORDER — ACETAMINOPHEN 650 MG RE SUPP: 650.0000 mg | Freq: Four times a day (QID) | RECTAL | Status: DC | PRN

## 2022-03-15 MED ORDER — LIDOCAINE 5 % EX PTCH: 2.0000 | MEDICATED_PATCH | Freq: Every day | CUTANEOUS | Status: DC | PRN

## 2022-03-15 MED ORDER — LORAZEPAM 0.5 MG PO TABS
0.5000 mg | ORAL_TABLET | Freq: Four times a day (QID) | ORAL | Status: DC | PRN
  Administered 2022-03-15: 0.5 mg via ORAL
  Filled 2022-03-15 (×2): qty 1

## 2022-03-15 MED ORDER — SENNOSIDES-DOCUSATE SODIUM 8.6-50 MG PO TABS: 1.0000 | ORAL_TABLET | Freq: Every evening | ORAL | Status: DC | PRN

## 2022-03-15 MED ORDER — KETOROLAC TROMETHAMINE 15 MG/ML IJ SOLN
15.0000 mg | Freq: Four times a day (QID) | INTRAMUSCULAR | Status: AC | PRN
  Administered 2022-03-15: 15 mg via INTRAVENOUS
  Filled 2022-03-15: qty 1

## 2022-03-15 MED ORDER — MELATONIN 5 MG PO TABS: 10.0000 mg | ORAL_TABLET | Freq: Every evening | ORAL | Status: DC | PRN

## 2022-03-15 MED ORDER — DICLOFENAC SODIUM 1 % EX GEL
4.0000 g | Freq: Three times a day (TID) | CUTANEOUS | Status: DC
  Administered 2022-03-15 – 2022-03-17 (×5): 4 g via TOPICAL
  Filled 2022-03-15: qty 100

## 2022-03-15 MED ORDER — ONDANSETRON HCL 4 MG PO TABS: 4.0000 mg | ORAL_TABLET | Freq: Four times a day (QID) | ORAL | Status: DC | PRN

## 2022-03-15 NOTE — ED Triage Notes (Signed)
Patient arrives via EMS from home with increased work of breathing, sob. Patient has COPD and has been out of medications for several days.  Patient was given 1g of mag, 1 albuterol, 1 duoneb via ems. Patient does not wear O2 at home. Patient states he smoked weed and used cocaine 2 days ago.

## 2022-03-15 NOTE — Hospital Course (Signed)
Mr. Dnaiel Dehring is a 67 year old male with history of hypertension, COPD, neuropathy, who presents to emergency department for chief concerns of shortness of breath.  Vitals in the ED showed temperature 98.1, respiration rate of 30, heart rate 91, blood pressure 192/133, improved to 156/100 at the time of admission, SpO2 of 100% on 6 L nasal cannula.  Serum sodium is 135, potassium 3.7, chloride 99, bicarb 27, BUN of 14, serum creatinine of 1.09, EGFR greater than 60, nonfasting blood glucose 130, WBC 7.3, hemoglobin 14.8, platelets of 290.  ED treatment: DuoNebs one-time dose, Ativan 0.5 mg p.o. one-time dose, labetalol 20 mg IV one-time dose, Solu-Medrol 125 mg IV one-time dose.

## 2022-03-15 NOTE — Assessment & Plan Note (Addendum)
Worse with breathing and cough Patient continues to use tobacco products and crack cocaine, attempted counseling given and patient is not ready to quit --Lidocaine patch PRN musculoskeletal pain --Toradol IV PRN, morphine IV PRN --Avoid muscle relaxer as this can cause diaphragm movement disruption

## 2022-03-15 NOTE — H&P (Addendum)
History and Physical   [place] [id number] DOB: 09-22-1955 DOA: 03/15/2022  PCP: Reid, [place], MD  Outpatient Specialists: [place] Patient coming from: Home via EMS  I have personally briefly reviewed patient's old medical records in [place].  Chief Concern: Shortness of breath  HPI: [name] is a 67 year old male with history of hypertension, COPD, neuropathy, who presents to emergency department for chief concerns of shortness of breath.  Vitals in the ED showed temperature 98.1, respiration rate of 30, heart rate 91, blood pressure 192/133, improved to 156/100 at the time of admission, SpO2 of 100% on 6 L nasal cannula.  Serum sodium is 135, potassium 3.7, chloride 99, bicarb 27, BUN of 14, serum creatinine of 1.09, EGFR greater than 60, nonfasting blood glucose 130, WBC 7.3, hemoglobin 14.8, platelets of 290.  ED treatment: DuoNebs one-time dose, Ativan 0.5 mg p.o. one-time dose, labetalol 20 mg IV one-time dose, Solu-Medrol 125 mg IV one-time dose. ------------------------------ Of note, patient wanted to go to the [place] however EDP called [facility] and it is on diversion.  At bedside patient was able to tell me his name, age, location, current calendar year.  Reports that he has been having shortness of breath.  He reports to me that he has been exposed to agent orange and that causes his lungs to be bad.  He endorses that he still smokes cigarettes, 2 to 3 cigarettes a day or sometimes none.  He also further endorses that he smoked crack cocaine.  I counseled him on cessation of crack cocaine and he states that it is good for his brain.  He states that sometimes he thinks too much and he does not like to think too much and when he takes crack cocaine it helps him stop thinking.  He reports over the last 2 days he has had productive sputum, of yellow mucus.  He denies fever, known sick contact, nausea, vomiting, abdominal pain, chest pain, dysuria, hematuria,  diarrhea.  Social history: He lives at home by himself.  He currently uses tobacco products, 2 to 3 cigarettes/day.  He is not ready to quit smoking.  He denies EtOH use.  He endorses use of THC and crack cocaine.  He is not ready to quit.   ROS: Constitutional: no weight change, no fever ENT/Mouth: no sore throat, no rhinorrhea Eyes: no eye pain, no vision changes Cardiovascular: no chest pain, + dyspnea,  no edema, no palpitations Respiratory: + cough, + sputum, no wheezing Gastrointestinal: no nausea, no vomiting, no diarrhea, no constipation Genitourinary: no urinary incontinence, no dysuria, no hematuria Musculoskeletal: no arthralgias, no myalgias Skin: no skin lesions, no pruritus, Neuro: + weakness, no loss of consciousness, no syncope Psych: no anxiety, no depression, + decrease appetite Heme/Lymph: no bruising, no bleeding  ED Course: Discussed with emergency medicine provider, patient requiring hospitalization for chief concerns of COPD exacerbation.  Assessment/Plan  Principal Problem:   COPD exacerbation (HCC) Active Problems:   HTN (hypertension)   HLD (hyperlipidemia)   History of tobacco use   Polysubstance abuse (HCC)   Insomnia   Musculoskeletal chest pain   Assessment and Plan:  * COPD exacerbation ([place]) Complicated by current crack cocaine smoking and THC and tobacco smoking Check respiratory panel on admission, COVID/influenza A/influenza B/RSV PCR Check procalcitonin on admission and magnesium in the a.m. DuoNebs 4 times daily, 4 doses ordered Solu-Medrol 40 mg IV daily, one-time dose ordered for 03/16/2022 Admit to telemetry medical, observation  Musculoskeletal chest pain Worse with  breathing and cough Patient continues to use tobacco products and crack cocaine, attempted counseling given and patient is not ready to quit Lidocaine daily to patches as needed for musculoskeletal pain Toradol 15 mg IV every 6 hours as needed for moderate pain, 1 day  ordered; morphine 4 mg IV every 4 hours as needed for severe pain, 3 doses ordered Avoid muscle relaxer as this can cause diaphragm movement disruption  Insomnia With depression Duloxetine 30 mg p.o. twice daily, trazodone 100 mg nightly resumed  Polysubstance abuse ([place]) Patient is not ready to quit Attempted counseling given  History of tobacco use As needed nicotine patch ordered for nicotine craving  HTN (hypertension) Amlodipine 10 mg daily, prazosin 3 mg nightly resumed Labetalol 5 mg IV every 3 hours as needed for SBP greater than 180, 4 doses ordered  Chart reviewed.   DVT prophylaxis: Enoxaparin Code Status: Full code Diet: Heart healthy Family Communication: None, patient declined Disposition Plan: Pending clinical course Consults called: None Admission status: Observation, telemetry medical  Past Medical History:  Diagnosis Date   Arthritis    COPD (chronic obstructive pulmonary disease) ([place])    Hyperlipidemia    Hypertension    Past Surgical History:  Procedure Laterality Date   NO PAST SURGERIES     Social History:  reports that he has been smoking cigarettes. He has been smoking an average of .25 packs per day. He has never used smokeless tobacco. He reports current drug use. Drugs: Marijuana and "Crack" cocaine. He reports that he does not drink alcohol.  Allergies  Allergen Reactions   Benzoyl Peroxide Anaphylaxis   Fish Oil Anaphylaxis   Methocarbamol Anaphylaxis   Pineapple Anaphylaxis   Carbomer    Tuberculin, Ppd     Other Reaction(s): Eruption of skin   Tuberculin Purified Protein Derivative Hives and Rash   History reviewed. No pertinent family history. Family history: Family history reviewed and not pertinent  Prior to Admission medications   Medication Sig Start Date End Date Taking? Authorizing Provider  gabapentin (NEURONTIN) 400 MG capsule Take 400 mg by mouth 3 (three) times daily.    [provider]   guaiFENesin-dextromethorphan (ROBITUSSIN DM) 100-10 MG/5ML syrup Take 5 mLs by mouth every 4 (four) hours as needed for cough. 06/11/17   Bettey Costa, MD  hydrochlorothiazide (HYDRODIURIL) 25 MG tablet Take 25 mg by mouth daily.    [provider]  ipratropium-albuterol (DUONEB) 0.5-2.5 (3) MG/3ML SOLN Take 3 mLs by nebulization every 4 (four) hours as needed. 01/11/18   Mayo, Pete Pelt, MD  tiotropium (SPIRIVA) 18 MCG inhalation capsule Place 1 capsule (18 mcg total) into inhaler and inhale every morning. 06/12/17   Bettey Costa, MD  traZODone (DESYREL) 100 MG tablet Take 150 mg by mouth at bedtime.    [provider]  vitamin C (ASCORBIC ACID) 500 MG tablet Take 500 mg by mouth daily.    [provider]   Physical Exam: Vitals:   03/15/22 1507 03/15/22 1507 03/15/22 1527 03/15/22 1642  BP:  (!) 145/101    Pulse:  88    Resp:  20    Temp:  98.6 F (37 C)    TempSrc: Oral     SpO2:  100%  100%  Weight:   64.3 kg   Height:   '5\' 6"'$  (1.676 m)    Constitutional: appears age-appropriate, NAD, calm, comfortable Eyes: PERRL, lids and conjunctivae normal ENMT: Mucous membranes are moist. Posterior pharynx clear of any exudate or lesions.  Age-appropriate dentition. Hearing appropriate Neck: normal, supple, no masses, no thyromegaly Respiratory: Generalized end expiratory wheezing on auscult patient, no crackles. Normal respiratory effort. No accessory muscle use.  Cardiovascular: Regular rate and rhythm, no murmurs / rubs / gallops. No extremity edema. 2+ pedal pulses. No carotid bruits.  Abdomen: no tenderness, no masses palpated, no hepatosplenomegaly. Bowel sounds positive.  Musculoskeletal: no clubbing / cyanosis. No joint deformity upper and lower extremities. Good ROM, no contractures, no atrophy. Normal muscle tone.  Skin: no rashes, lesions, ulcers. No induration Neurologic: Sensation intact. Strength 5/5 in all 4.  Psychiatric: Normal judgment and insight. Alert  and oriented x 3. Normal mood.   EKG: Ordered pending completion and upload  Chest x-ray on Admission: I personally reviewed and I agree with radiologist reading as below.  DG Chest Port 1 View  Result Date: 03/15/2022 CLINICAL DATA:  Shortness of breath EXAM: PORTABLE CHEST 1 VIEW COMPARISON:  02/17/2018 FINDINGS: The heart size and mediastinal contours are within normal limits. Hyperinflated lungs. No focal airspace consolidation, pleural effusion, or pneumothorax. The visualized skeletal structures are unremarkable. IMPRESSION: Hyperinflated lungs without focal airspace consolidation. Electronically Signed   By: Davina Poke D.O.   On: 03/15/2022 10:33    Labs on Admission: I have personally reviewed following labs  CBC: Recent Labs  Lab 03/15/22 0956  WBC 7.3  HGB 14.8  HCT 45.4  MCV 91.0  PLT [id number]   Basic Metabolic Panel: Recent Labs  Lab 03/15/22 0956  NA 135  K 3.7  CL 99  CO2 27  GLUCOSE 130*  BUN 14  CREATININE 1.09  CALCIUM 9.2   GFR: Estimated Creatinine Clearance: 58.5 mL/min (by C-G formula based on SCr of 1.09 mg/dL).  Liver Function Tests: Recent Labs  Lab 03/15/22 0956  AST 25  ALT 13  ALKPHOS 61  BILITOT 0.4  PROT 8.6*  ALBUMIN 4.1   This document was prepared using Dragon Voice Recognition software and may include unintentional dictation errors.  Dr. Tobie Poet [facility]  If 7PM-7AM, please contact overnight-coverage provider If 7AM-7PM, please contact day coverage provider [email or web address]  03/15/2022, 6:33 PM

## 2022-03-15 NOTE — Assessment & Plan Note (Addendum)
Continue home meds - amlodipine, prazosin,  PRN labetalol

## 2022-03-15 NOTE — Assessment & Plan Note (Addendum)
Depression Continue duloxetine, trazodone

## 2022-03-15 NOTE — Assessment & Plan Note (Signed)
Nicotine patch ordered.

## 2022-03-15 NOTE — Assessment & Plan Note (Addendum)
Complicated by current crack cocaine smoking and THC and tobacco smoking.  Negative Covid/Flu/RSV. --Treated with scheduled and PRN nebs --Treated wtih IV steroids  --Discharge on prednisone taper --Weaned off O2, stable sats on room air at rest and ambulating

## 2022-03-15 NOTE — ED Provider Notes (Signed)
[facility] Provider Note    Event Date/Time   First MD Initiated Contact with Patient 03/15/22 [phone]     (approximate)   History   Shortness of Breath (Increased work of breathing)   HPI  Parker Brown is a 67 y.o. male with history of COPD who presents with complaints of shortness of breath.  EMS reports they gave nebulizers and route for significant shortness of breath.  History limited by work of breathing.  Does admit to cocaine and marijuana use     Physical Exam   Triage Vital Signs: ED Triage Vitals  Enc Vitals Group     BP      Pulse      Resp      Temp      Temp src      SpO2      Weight      Height      Head Circumference      Peak Flow      Pain Score      Pain Loc      Pain Edu?      Excl. in [place]?     Most recent vital signs: Vitals:   03/15/22 0954  BP: (!) 192/133  Pulse: 91  SpO2: 100%     General: Awake, diaphoretic, able to talk with effort CV:  Good peripheral perfusion.  Tachycardia Resp:  Increased effort with tachypnea, diffuse wheezing, decent airflow Abd:  No distention.  Other:  No lower extremity edema or pain   ED Results / Procedures / Treatments   Labs (all labs ordered are listed, but only abnormal results are displayed) Labs Reviewed  COMPREHENSIVE METABOLIC PANEL - Abnormal; Notable for the following components:      Result Value   Glucose, Bld 130 (*)    Total Protein 8.6 (*)    All other components within normal limits  CBC  BRAIN NATRIURETIC PEPTIDE     EKG    RADIOLOGY Chest x-ray viewed interpreted by me, no pneumonia    PROCEDURES:  Critical Care performed:   Procedures   MEDICATIONS ORDERED IN ED: Medications  labetalol (NORMODYNE) injection 20 mg (has no administration in time range)  ipratropium-albuterol (DUONEB) 0.5-2.5 (3) MG/3ML nebulizer solution 3 mL (3 mLs Nebulization Given 03/15/22 0959)  ipratropium-albuterol (DUONEB) 0.5-2.5 (3) MG/3ML nebulizer  solution 3 mL (3 mLs Nebulization Given 03/15/22 0959)  methylPREDNISolone sodium succinate (SOLU-MEDROL) 125 mg/2 mL injection 125 mg (125 mg Intravenous Given 03/15/22 0959)  LORazepam (ATIVAN) 2 MG/ML concentrated solution 0.5 mg (0.5 mg Oral Given 03/15/22 1045)     IMPRESSION / MDM / ASSESSMENT AND PLAN / ED COURSE  I reviewed the triage vital signs and the nursing notes. Patient's presentation is most consistent with acute presentation with potential threat to life or bodily function.  Patient presents with shortness of breath as detailed above.  Suspicious for severe COPD exacerbation versus possible pneumonia, he is hypertensive however presentation less consistent with CHF/pulmonary edema  Will continue with DuoNebs, give IV Solu-Medrol while we await labs, chest x-ray  Oxygen saturations are reassuring on nasal cannula  ----------------------------------------- 11:59 AM on 03/15/2022 ----------------------------------------- Lab work reassuring, chest x-ray without evidence of pneumonia, patient still with wheezing, remains markedly hypertensive, will give IV labetalol, will consult hospitalist for admission for treatment of COPD and elevated blood pressure.        FINAL CLINICAL IMPRESSION(S) / ED DIAGNOSES   Final diagnoses:  COPD exacerbation ([place])  Rx / DC Orders   ED Discharge Orders     None        Note:  This document was prepared using Dragon voice recognition software and may include unintentional dictation errors.   Lavonia Drafts, MD 03/15/22 [phone]

## 2022-03-15 NOTE — Assessment & Plan Note (Signed)
Patient is not ready to quit Attempted counseling given

## 2022-03-16 DIAGNOSIS — J441 Chronic obstructive pulmonary disease with (acute) exacerbation: Secondary | ICD-10-CM | POA: Diagnosis not present

## 2022-03-16 LAB — BASIC METABOLIC PANEL
Anion gap: 8 (ref 5–15)
BUN: 30 mg/dL — ABNORMAL HIGH (ref 8–23)
CO2: 25 mmol/L (ref 22–32)
Calcium: 8.8 mg/dL — ABNORMAL LOW (ref 8.9–10.3)
Chloride: 103 mmol/L (ref 98–111)
Creatinine, Ser: 1.28 mg/dL — ABNORMAL HIGH (ref 0.61–1.24)
GFR, Estimated: >60 mL/min (ref >60)
Glucose, Bld: 127 mg/dL — ABNORMAL HIGH (ref 70–99)
Potassium: 3.7 mmol/L (ref 3.5–5.1)
Sodium: 136 mmol/L (ref 135–145)

## 2022-03-16 LAB — CBC
HCT: 35.5 % — ABNORMAL LOW (ref 39.0–52.0)
Hemoglobin: 11.9 g/dL — ABNORMAL LOW (ref 13.0–17.0)
MCH: 29.9 pg (ref 26.0–34.0)
MCHC: 33.5 g/dL (ref 30.0–36.0)
MCV: 89.2 fL (ref 80.0–100.0)
Platelets: 266 10*3/uL (ref 150–400)
RBC: 3.98 MIL/uL — ABNORMAL LOW (ref 4.22–5.81)
RDW: 13.6 % (ref 11.5–15.5)
WBC: 6.4 10*3/uL (ref 4.0–10.5)
nRBC: 0.3 % — ABNORMAL HIGH (ref 0.0–0.2)

## 2022-03-16 LAB — MAGNESIUM: Magnesium: 2.4 mg/dL (ref 1.7–2.4)

## 2022-03-16 MED ORDER — SODIUM CHLORIDE 0.9 % IV SOLN: INTRAVENOUS | Status: DC

## 2022-03-16 MED ORDER — GUAIFENESIN ER 600 MG PO TB12
600.0000 mg | ORAL_TABLET | Freq: Two times a day (BID) | ORAL | Status: DC
  Administered 2022-03-16 – 2022-03-17 (×3): 600 mg via ORAL
  Filled 2022-03-16 (×3): qty 1

## 2022-03-16 MED ORDER — IPRATROPIUM-ALBUTEROL 0.5-2.5 (3) MG/3ML IN SOLN
3.0000 mL | Freq: Four times a day (QID) | RESPIRATORY_TRACT | Status: DC | PRN
  Administered 2022-03-16 – 2022-03-17 (×2): 3 mL via RESPIRATORY_TRACT
  Filled 2022-03-16 (×2): qty 3

## 2022-03-16 MED ORDER — METHYLPREDNISOLONE SODIUM SUCC 40 MG IJ SOLR
40.0000 mg | Freq: Two times a day (BID) | INTRAMUSCULAR | Status: AC
  Administered 2022-03-16 – 2022-03-17 (×2): 40 mg via INTRAVENOUS
  Filled 2022-03-16 (×2): qty 1

## 2022-03-16 NOTE — Assessment & Plan Note (Signed)
Appears not on statin

## 2022-03-16 NOTE — Plan of Care (Signed)

## 2022-03-16 NOTE — Progress Notes (Signed)
  Progress Note   PatientNiranjan Brown DEY:[id number] DOB: Apr 16, 1955 DOA: 03/15/2022     0 DOS: the patient was seen and examined on 03/16/2022   Brief hospital course: [name] is a 67 year old male with history of hypertension, COPD, neuropathy, who presented to the ED on 03/15/2022 for evaluation of shortness of breath.  Vitals in the ED showed temperature 98.1, respiration rate of 30, heart rate 91, blood pressure 192/133, improved to 156/100 at the time of admission, SpO2 of 100% on 6 L nasal cannula.  ED treatment: DuoNebs one-time dose, Ativan 0.5 mg p.o. one-time dose, labetalol 20 mg IV one-time dose, Solu-Medrol 125 mg IV one-time dose.  Admitted to the hospital for further management of COPD with acute exacerbation, complicated by acute respiratory failure with hypoxia.  3/10 -- remains on 2 L O2 this AM.  Ongoing dyspnea.  Assessment and Plan: * COPD exacerbation ([name]) Complicated by current crack cocaine smoking and THC and tobacco smoking.  Negative Covid/Flu/RSV. --Pending RVP - follow --Continue scheduled DuoNebs  --Continue IV steroids today and transition to prednisone tomorrow --O2 if sats < 88% on room air  Musculoskeletal chest pain Worse with breathing and cough Patient continues to use tobacco products and crack cocaine, attempted counseling given and patient is not ready to quit --Lidocaine patch PRN musculoskeletal pain --Toradol IV PRN, morphine IV PRN --Avoid muscle relaxer as this can cause diaphragm movement disruption  Insomnia Depression Continue duloxetine, trazodone   Polysubstance abuse ([name]) Patient is not ready to quit Attempted counseling given  History of tobacco use Nicotine patch ordered   HLD (hyperlipidemia) Appears not on statin  HTN (hypertension) Continue home meds - amlodipine, prazosin,  PRN labetalol        Subjective: Pt was sleeping soundly when seen this AM.  He woke easily, reported feeling slightly  better today but still short of breath with minimal exertion.  No other acute complaints.  Confirms not on home O2 at baseline.  Physical Exam: Vitals:   03/15/22 2357 03/16/22 0700 03/16/22 0851 03/16/22 1152  BP: 119/70  136/88   Pulse: 98  86   Resp: 18  18   Temp: 98.9 F (37.2 C)  97.9 F (36.6 C)   TempSrc:      SpO2: 99% 97% 100% 96%  Weight:      Height:       General exam: awake, alert, no acute distress, chronically ill appearing HEENT: moist mucus membranes, hearing grossly normal  Respiratory system: severely diminished, not actively wheezing, normal respiratory effort at rest on 2 L/min O2. Cardiovascular system: normal S1/S2, RRR, no pedal edema.   Gastrointestinal system: soft, NT, ND, no HSM felt, +bowel sounds. Central nervous system: no gross focal neurologic deficits, normal speech Extremities: , no edema, normal tone Skin: dry, intact, normal temperature Psychiatry: normal mood, congruent affect   Data Reviewed:  Notable labs ---  glucopse 127, Cr 1.08 >> 1.28, Ca 8.8, Hbg 11.9 from 14.8 (?dilution)  Family Communication: none  Disposition: Status is: Observation The patient remains OBS appropriate and will d/c before 2 midnights. Needs further IV steroids and clinical improvement.   Planned Discharge Destination: Home    Time spent: 38 minutes  Author: Ezekiel Slocumb, DO 03/16/2022 12:06 PM  For on call review [email or web address].

## 2022-03-17 DIAGNOSIS — J441 Chronic obstructive pulmonary disease with (acute) exacerbation: Secondary | ICD-10-CM

## 2022-03-17 LAB — BASIC METABOLIC PANEL
Anion gap: 9 (ref 5–15)
BUN: 21 mg/dL (ref 8–23)
CO2: 23 mmol/L (ref 22–32)
Calcium: 8.9 mg/dL (ref 8.9–10.3)
Chloride: 104 mmol/L (ref 98–111)
Creatinine, Ser: 1 mg/dL (ref 0.61–1.24)
GFR, Estimated: >60 mL/min (ref >60)
Glucose, Bld: 137 mg/dL — ABNORMAL HIGH (ref 70–99)
Potassium: 4.1 mmol/L (ref 3.5–5.1)
Sodium: 136 mmol/L (ref 135–145)

## 2022-03-17 LAB — CBC
HCT: 36 % — ABNORMAL LOW (ref 39.0–52.0)
Hemoglobin: 11.9 g/dL — ABNORMAL LOW (ref 13.0–17.0)
MCH: 29.8 pg (ref 26.0–34.0)
MCHC: 33.1 g/dL (ref 30.0–36.0)
MCV: 90.2 fL (ref 80.0–100.0)
Platelets: 264 10*3/uL (ref 150–400)
RBC: 3.99 MIL/uL — ABNORMAL LOW (ref 4.22–5.81)
RDW: 13.7 % (ref 11.5–15.5)
WBC: 9.8 10*3/uL (ref 4.0–10.5)
nRBC: 0 % (ref 0.0–0.2)

## 2022-03-17 MED ORDER — PREDNISONE 10 MG PO TABS: ORAL_TABLET | ORAL | 0 refills | Status: AC

## 2022-03-17 NOTE — Discharge Summary (Signed)
Physician Discharge Summary   Patient: Parker Brown MRN: [id number] DOB: 29-Nov-1955  Admit date:     03/15/2022  Discharge date: 03/18/22  Discharge Physician: Ezekiel Slocumb   PCP: Reid, [place], MD   Recommendations at discharge:    Follow up with Primary Care   Discharge Diagnoses: Principal Problem:   COPD exacerbation ([place]) Active Problems:   HTN (hypertension)   HLD (hyperlipidemia)   History of tobacco use   Polysubstance abuse ([place])   Insomnia   Musculoskeletal chest pain  Resolved Problems:   * No resolved hospital problems. [facility] Course: [name] is a 67 year old male with history of hypertension, COPD, neuropathy, who presented to the ED on 03/15/2022 for evaluation of shortness of breath.  Vitals in the ED showed temperature 98.1, respiration rate of 30, heart rate 91, blood pressure 192/133, improved to 156/100 at the time of admission, SpO2 of 100% on 6 L nasal cannula.  ED treatment: DuoNebs one-time dose, Ativan 0.5 mg p.o. one-time dose, labetalol 20 mg IV one-time dose, Solu-Medrol 125 mg IV one-time dose.  Admitted to the hospital for further management of COPD with acute exacerbation, complicated by acute respiratory failure with hypoxia.  3/10 -- remains on 2 L O2 this AM.  Ongoing dyspnea.  3/11 -- weaned off oxygen, clinically improved. Ambulated around unit without O2 desaturation.  Stable for d/c on oral steroids.  Assessment and Plan: * COPD exacerbation ([place]) Complicated by current crack cocaine smoking and THC and tobacco smoking.  Negative Covid/Flu/RSV. --Treated with scheduled and PRN nebs --Treated wtih IV steroids  --Discharge on prednisone taper --Weaned off O2, stable sats on room air at rest and ambulating  Musculoskeletal chest pain Worse with breathing and cough Patient continues to use tobacco products and crack cocaine, attempted counseling given and patient is not ready to quit --Lidocaine patch PRN  musculoskeletal pain --Toradol IV PRN, morphine IV PRN --Avoid muscle relaxer as this can cause diaphragm movement disruption  Insomnia Depression Continue duloxetine, trazodone   Polysubstance abuse ([place]) Patient is not ready to quit Attempted counseling given  History of tobacco use Nicotine patch ordered   HLD (hyperlipidemia) Appears not on statin  HTN (hypertension) Continue home meds - amlodipine, prazosin,  PRN labetalol         Consultants: none Procedures performed: none  Disposition: Home Diet recommendation:  Regular diet DISCHARGE MEDICATION: Allergies as of 03/17/2022       Reactions   Benzoyl Peroxide Anaphylaxis   Fish Oil Anaphylaxis   Methocarbamol Anaphylaxis   Pineapple Anaphylaxis   Carbomer    Tuberculin, Ppd    Other Reaction(s): Eruption of skin   Tuberculin Purified Protein Derivative Hives, Rash        Medication List     TAKE these medications    acetaminophen 500 MG tablet Commonly known as: TYLENOL Take 1,000 mg by mouth every 6 (six) hours as needed for mild pain.   amLODipine 10 MG tablet Commonly known as: NORVASC Take 10 mg by mouth daily.   diclofenac Sodium 1 % Gel Commonly known as: VOLTAREN Apply 4 g topically 3 (three) times daily.   DULoxetine 30 MG capsule Commonly known as: CYMBALTA Take 30 mg by mouth every 12 (twelve) hours.   fluticasone-salmeterol 250-50 MCG/ACT Aepb Commonly known as: ADVAIR Inhale 1 puff into the lungs in the morning and at bedtime.   gabapentin 300 MG capsule Commonly known as: NEURONTIN Take 300 mg by mouth 3 (three) times daily.  prazosin 1 MG capsule Commonly known as: MINIPRESS Take 3 mg by mouth at bedtime.   predniSONE 10 MG tablet Commonly known as: DELTASONE Take 4 tablets (40 mg total) by mouth daily with breakfast for 1 day, THEN 3 tablets (30 mg total) daily with breakfast for 1 day, THEN 2 tablets (20 mg total) daily with breakfast for 1 day, THEN 1 tablet  (10 mg total) daily with breakfast for 1 day. Start taking on: March 18, 2022   traZODone 100 MG tablet Commonly known as: DESYREL Take 100 mg by mouth at bedtime.        Discharge Exam: Filed Weights   03/15/22 0952 03/15/22 1527  Weight: 64.3 kg 64.3 kg   General exam: awake, alert, no acute distress, chronically ill appearing HEENT: atraumatic, clear conjunctiva, anicteric sclera, moist mucus membranes, hearing grossly normal  Respiratory system: CTAB diminished throughout, no wheezes, rales or rhonchi, normal respiratory effort. Cardiovascular system: normal S1/S2, RRR, no JVD, murmurs, rubs, gallops, no pedal edema.   Gastrointestinal system: soft, NT, ND, no HSM felt, +bowel sounds. Central nervous system: A&O x4. no gross focal neurologic deficits, normal speech Extremities: moves all , no edema, normal tone Skin: dry, intact, normal temperature, normal color, No rashes, lesions or ulcers Psychiatry: normal mood, congruent affect, judgement and insight appear normal   Condition at discharge: stable  The results of significant diagnostics from this hospitalization (including imaging, microbiology, ancillary and laboratory) are listed below for reference.   Imaging Studies: DG Chest Port 1 View  Result Date: 03/15/2022 CLINICAL DATA:  Shortness of breath EXAM: PORTABLE CHEST 1 VIEW COMPARISON:  02/17/2018 FINDINGS: The heart size and mediastinal contours are within normal limits. Hyperinflated lungs. No focal airspace consolidation, pleural effusion, or pneumothorax. The visualized skeletal structures are unremarkable. IMPRESSION: Hyperinflated lungs without focal airspace consolidation. Electronically Signed   By: Davina Poke D.O.   On: 03/15/2022 10:33    Microbiology: Results for orders placed or performed during the hospital encounter of 03/15/22  Resp panel by RT-PCR (RSV, Flu A&B, Covid) Anterior Nasal Swab     Status: None   Collection Time: 03/15/22  1:18 PM    Specimen: Anterior Nasal Swab  Result Value Ref Range Status   SARS Coronavirus 2 by RT PCR NEGATIVE NEGATIVE Final    Comment: (NOTE) SARS-CoV-2 target nucleic acids are NOT DETECTED.  The SARS-CoV-2 RNA is generally detectable in upper respiratory specimens during the acute phase of infection. The lowest concentration of SARS-CoV-2 viral copies this assay can detect is 138 copies/mL. A negative result does not preclude SARS-Cov-2 infection and should not be used as the sole basis for treatment or other patient management decisions. A negative result may occur with  improper specimen collection/handling, submission of specimen other than nasopharyngeal swab, presence of viral mutation(s) within the areas targeted by this assay, and inadequate number of viral copies(<138 copies/mL). A negative result must be combined with clinical observations, patient history, and epidemiological information. The expected result is Negative.  Fact Sheet for Patients:  EntrepreneurPulse.com.au  Fact Sheet for Healthcare Providers:  IncredibleEmployment.be  This test is no t yet approved or cleared by the [place] FDA and  has been authorized for detection and/or diagnosis of SARS-CoV-2 by FDA under an Emergency Use Authorization (EUA). This EUA will remain  in effect (meaning this test can be used) for the duration of the COVID-19 declaration under Section 564(b)(1) of the Act, 21 U.S.C.section 360bbb-3(b)(1), unless the authorization is terminated  or  revoked sooner.       Influenza A by PCR NEGATIVE NEGATIVE Final   Influenza B by PCR NEGATIVE NEGATIVE Final    Comment: (NOTE) The Xpert Xpress SARS-CoV-2/FLU/RSV plus assay is intended as an aid in the diagnosis of influenza from Nasopharyngeal swab specimens and should not be used as a sole basis for treatment. Nasal washings and aspirates are unacceptable for Xpert Xpress  SARS-CoV-2/FLU/RSV testing.  Fact Sheet for Patients: EntrepreneurPulse.com.au  Fact Sheet for Healthcare Providers: IncredibleEmployment.be  This test is not yet approved or cleared by the [place] FDA and has been authorized for detection and/or diagnosis of SARS-CoV-2 by FDA under an Emergency Use Authorization (EUA). This EUA will remain in effect (meaning this test can be used) for the duration of the COVID-19 declaration under Section 564(b)(1) of the Act, 21 U.S.C. section 360bbb-3(b)(1), unless the authorization is terminated or revoked.     Resp Syncytial Virus by PCR NEGATIVE NEGATIVE Final    Comment: (NOTE) Fact Sheet for Patients: EntrepreneurPulse.com.au  Fact Sheet for Healthcare Providers: IncredibleEmployment.be  This test is not yet approved or cleared by the [place] FDA and has been authorized for detection and/or diagnosis of SARS-CoV-2 by FDA under an Emergency Use Authorization (EUA). This EUA will remain in effect (meaning this test can be used) for the duration of the COVID-19 declaration under Section 564(b)(1) of the Act, 21 U.S.C. section 360bbb-3(b)(1), unless the authorization is terminated or revoked.  Performed at [facility], [address] [place], [place] [address]     Labs: CBC: Recent Labs  Lab 03/15/22 [phone] 03/16/22 0526 03/17/22 0533  WBC 7.3 6.4 9.8  HGB 14.8 11.9* 11.9*  HCT 45.4 35.5* 36.0*  MCV 91.0 89.2 90.2  PLT 290 266 [id number]   Basic Metabolic Panel: Recent Labs  Lab 03/15/22 0956 03/16/22 0526 03/17/22 0533  NA 135 136 136  K 3.7 3.7 4.1  CL 99 103 104  CO2 '27 25 23  '$ GLUCOSE 130* 127* 137*  BUN 14 30* 21  CREATININE 1.09 1.28* 1.00  CALCIUM 9.2 8.8* 8.9  MG  --  2.4  --    Liver Function Tests: Recent Labs  Lab 03/15/22 0956  AST 25  ALT 13  ALKPHOS 61  BILITOT 0.4  PROT 8.6*  ALBUMIN 4.1   CBG: No  results for input(s): "GLUCAP" in the last 168 hours.  Discharge time spent: less than 30 minutes.  Signed: Ezekiel Slocumb, DO [facility] 03/18/2022

## 2022-03-17 NOTE — Plan of Care (Signed)

## 2022-03-17 NOTE — Discharge Instructions (Signed)
                  Intensive Outpatient Programs  [facility]    [facility] [address]     [address] [place],  [place]     [place], [place] [phone]      [phone]  [facility]   [facility]  (Inpatient and outpatient)  [phone] (Suboxone and Methadone) [address]           [phone]           [facility]    [facility] - Intensive Outpatient [address]     [address] [place], [place] [address]     [place], [place]  [phone]      [phone]  [facility] (Outpatient, Inpatient, Chemical  [facility] (Groups and Residental) (insurance only) [phone]    [place], [place]          [phone]       [facility]    [facility] (for caregivers and family) [address]     [address] [place], [place]     [place], [place] [phone]      [phone]  Residential Treatment Programs  [facility]  [facility](2 years) Residential: 90 days)  [facility] [address]      [address] [place], [place]     [place], [place] [phone]      [phone] or [phone]  [facility]    [name] [address]      [address] [place], [place]     [place], [place] [phone]      [phone]  [facility]   [facility] ([facility]) [address]     [address] [place], [place] [address]     [place], [place] [phone]      [phone] Admissions: 8am-3pm M-F  [facility]: Residential Program (90 Days)              [facility]: [facility]  [place],      [place],   [phone] or [phone]    (Walk in Hours over the weekend or by referral)   [facility]: [facility]:[phone] (for crisis  response 24 hours a day) 

## 2022-03-17 NOTE — TOC Progression Note (Signed)
Transition of Care [facility]) - Progression Note    Patient Details  Name: Parker Brown MRN: [id number] Date of Birth: 12/11/1955  Transition of Care [facility]) CM/SW [place], RN Phone Number: 03/17/2022, 12:06 PM  Clinical Narrative:    Added Substance abuse resources to the DC packet to print at DC. No additional TOC needs identified   Expected Discharge Plan: Home/Self Care Barriers to Discharge: No Barriers Identified  Expected Discharge Plan and Services       Living arrangements for the past 2 months: Single Family Home                                       Social Determinants of Health (SDOH) Interventions SDOH Screenings   Food Insecurity: No Food Insecurity (03/15/2022)  Housing: Low Risk  (03/15/2022)  Transportation Needs: No Transportation Needs (03/15/2022)  Utilities: Not At Risk (03/15/2022)  Tobacco Use: High Risk (03/15/2022)    Readmission Risk Interventions     No data to display

## 2022-10-19 ENCOUNTER — Other Ambulatory Visit: Payer: Self-pay

## 2022-10-19 ENCOUNTER — Observation Stay
Admission: EM | Admit: 2022-10-19 | Discharge: 2022-10-20 | Disposition: A | Discharge status: Home / Self Care | Payer: No Typology Code available for payment source | Attending: Internal Medicine | Admitting: Internal Medicine

## 2022-10-19 ENCOUNTER — Emergency Department: Payer: No Typology Code available for payment source

## 2022-10-19 DIAGNOSIS — B182 Chronic viral hepatitis C: Secondary | ICD-10-CM | POA: Diagnosis not present

## 2022-10-19 DIAGNOSIS — F4310 Post-traumatic stress disorder, unspecified: Secondary | ICD-10-CM | POA: Insufficient documentation

## 2022-10-19 DIAGNOSIS — G4700 Insomnia, unspecified: Secondary | ICD-10-CM | POA: Diagnosis present

## 2022-10-19 DIAGNOSIS — F1910 Other psychoactive substance abuse, uncomplicated: Secondary | ICD-10-CM | POA: Diagnosis present

## 2022-10-19 DIAGNOSIS — R0602 Shortness of breath: Secondary | ICD-10-CM | POA: Diagnosis present

## 2022-10-19 DIAGNOSIS — I10 Essential (primary) hypertension: Secondary | ICD-10-CM | POA: Diagnosis present

## 2022-10-19 DIAGNOSIS — F17210 Nicotine dependence, cigarettes, uncomplicated: Secondary | ICD-10-CM | POA: Diagnosis not present

## 2022-10-19 DIAGNOSIS — Z1152 Encounter for screening for COVID-19: Secondary | ICD-10-CM | POA: Insufficient documentation

## 2022-10-19 DIAGNOSIS — Z79899 Other long term (current) drug therapy: Secondary | ICD-10-CM | POA: Diagnosis not present

## 2022-10-19 DIAGNOSIS — J441 Chronic obstructive pulmonary disease with (acute) exacerbation: Principal | ICD-10-CM

## 2022-10-19 DIAGNOSIS — K5730 Diverticulosis of large intestine without perforation or abscess without bleeding: Secondary | ICD-10-CM | POA: Insufficient documentation

## 2022-10-19 DIAGNOSIS — M545 Low back pain, unspecified: Secondary | ICD-10-CM | POA: Insufficient documentation

## 2022-10-19 DIAGNOSIS — R197 Diarrhea, unspecified: Secondary | ICD-10-CM | POA: Insufficient documentation

## 2022-10-19 DIAGNOSIS — M1990 Unspecified osteoarthritis, unspecified site: Secondary | ICD-10-CM | POA: Insufficient documentation

## 2022-10-19 LAB — BASIC METABOLIC PANEL
Anion gap: 10 (ref 5–15)
BUN: 13 mg/dL (ref 8–23)
CO2: 25 mmol/L (ref 22–32)
Calcium: 8.9 mg/dL (ref 8.9–10.3)
Chloride: 104 mmol/L (ref 98–111)
Creatinine, Ser: 1.15 mg/dL (ref 0.61–1.24)
GFR, Estimated: >60 mL/min (ref >60)
Glucose, Bld: 101 mg/dL — ABNORMAL HIGH (ref 70–99)
Potassium: 4.3 mmol/L (ref 3.5–5.1)
Sodium: 139 mmol/L (ref 135–145)

## 2022-10-19 LAB — BLOOD GAS, VENOUS
Acid-Base Excess: 3.2 mmol/L — ABNORMAL HIGH (ref 0.0–2.0)
Bicarbonate: 28.5 mmol/L — ABNORMAL HIGH (ref 20.0–28.0)
O2 Saturation: 54.8 %
Patient temperature: 37
pCO2, Ven: 45 mm[Hg] (ref 44–60)
pH, Ven: 7.41 (ref 7.25–7.43)
pO2, Ven: 36 mm[Hg] (ref 32–45)

## 2022-10-19 LAB — CBC
HCT: 43.1 % (ref 39.0–52.0)
Hemoglobin: 14.2 g/dL (ref 13.0–17.0)
MCH: 30 pg (ref 26.0–34.0)
MCHC: 32.9 g/dL (ref 30.0–36.0)
MCV: 90.9 fL (ref 80.0–100.0)
Platelets: 260 10*3/uL (ref 150–400)
RBC: 4.74 MIL/uL (ref 4.22–5.81)
RDW: 14.6 % (ref 11.5–15.5)
WBC: 7.8 10*3/uL (ref 4.0–10.5)
nRBC: 0 % (ref 0.0–0.2)

## 2022-10-19 LAB — BRAIN NATRIURETIC PEPTIDE: B Natriuretic Peptide: 58.6 pg/mL (ref 0.0–100.0)

## 2022-10-19 LAB — RESP PANEL BY RT-PCR (RSV, FLU A&B, COVID)  RVPGX2
Influenza A by PCR: NEGATIVE
Influenza B by PCR: NEGATIVE
Resp Syncytial Virus by PCR: NEGATIVE
SARS Coronavirus 2 by RT PCR: NEGATIVE

## 2022-10-19 LAB — PROCALCITONIN: Procalcitonin: <0.1 ng/mL

## 2022-10-19 MED ORDER — GABAPENTIN 300 MG PO CAPS
300.0000 mg | ORAL_CAPSULE | Freq: Three times a day (TID) | ORAL | Status: DC
  Administered 2022-10-19 – 2022-10-20 (×2): 300 mg via ORAL
  Filled 2022-10-19 (×2): qty 1

## 2022-10-19 MED ORDER — POLYETHYLENE GLYCOL 3350 17 G PO PACK: 17.0000 g | PACK | Freq: Every day | ORAL | Status: DC | PRN

## 2022-10-19 MED ORDER — BUDESONIDE 0.5 MG/2ML IN SUSP
2.0000 mg | Freq: Two times a day (BID) | RESPIRATORY_TRACT | Status: DC
  Administered 2022-10-19: 2 mg via RESPIRATORY_TRACT
  Filled 2022-10-19 (×2): qty 8

## 2022-10-19 MED ORDER — IPRATROPIUM-ALBUTEROL 0.5-2.5 (3) MG/3ML IN SOLN
3.0000 mL | Freq: Four times a day (QID) | RESPIRATORY_TRACT | Status: DC
  Administered 2022-10-19 – 2022-10-20 (×5): 3 mL via RESPIRATORY_TRACT
  Filled 2022-10-19 (×5): qty 3

## 2022-10-19 MED ORDER — SODIUM CHLORIDE 0.9% FLUSH
3.0000 mL | Freq: Two times a day (BID) | INTRAVENOUS | Status: DC
  Administered 2022-10-19 – 2022-10-20 (×3): 3 mL via INTRAVENOUS

## 2022-10-19 MED ORDER — DULOXETINE HCL 30 MG PO CPEP
30.0000 mg | ORAL_CAPSULE | Freq: Two times a day (BID) | ORAL | Status: DC
  Administered 2022-10-19 – 2022-10-20 (×2): 30 mg via ORAL
  Filled 2022-10-19 (×2): qty 1

## 2022-10-19 MED ORDER — PREDNISONE 20 MG PO TABS
40.0000 mg | ORAL_TABLET | Freq: Every day | ORAL | Status: DC
  Administered 2022-10-20: 40 mg via ORAL
  Filled 2022-10-19: qty 2

## 2022-10-19 MED ORDER — ACETAMINOPHEN 325 MG PO TABS: 650.0000 mg | ORAL_TABLET | Freq: Four times a day (QID) | ORAL | Status: DC | PRN

## 2022-10-19 MED ORDER — IPRATROPIUM BROMIDE 0.02 % IN SOLN
0.5000 mg | Freq: Once | RESPIRATORY_TRACT | Status: AC
  Administered 2022-10-19: 0.5 mg via RESPIRATORY_TRACT
  Filled 2022-10-19: qty 2.5

## 2022-10-19 MED ORDER — ONDANSETRON HCL 4 MG/2ML IJ SOLN: 4.0000 mg | Freq: Four times a day (QID) | INTRAMUSCULAR | Status: DC | PRN

## 2022-10-19 MED ORDER — ENOXAPARIN SODIUM 40 MG/0.4ML IJ SOSY
40.0000 mg | PREFILLED_SYRINGE | INTRAMUSCULAR | Status: DC
  Administered 2022-10-19: 40 mg via SUBCUTANEOUS
  Filled 2022-10-19: qty 0.4

## 2022-10-19 MED ORDER — ALBUTEROL SULFATE (2.5 MG/3ML) 0.083% IN NEBU
10.0000 mg | INHALATION_SOLUTION | Freq: Once | RESPIRATORY_TRACT | Status: AC
  Administered 2022-10-19: 10 mg via RESPIRATORY_TRACT
  Filled 2022-10-19: qty 12

## 2022-10-19 MED ORDER — AMLODIPINE BESYLATE 5 MG PO TABS
10.0000 mg | ORAL_TABLET | Freq: Every day | ORAL | Status: DC
  Administered 2022-10-20: 10 mg via ORAL
  Filled 2022-10-19: qty 2

## 2022-10-19 MED ORDER — TRAZODONE HCL 100 MG PO TABS: 100.0000 mg | ORAL_TABLET | Freq: Every evening | ORAL | Status: DC | PRN

## 2022-10-19 NOTE — H&P (Signed)
History and Physical    PatientHarkaran Brown [id number] DOB: December 29, 1955 DOA: 10/19/2022 DOS: the patient was seen and examined on 10/19/2022 PCP: Pcp, No  Patient coming from: Home  Chief Complaint:  Chief Complaint  Patient presents with   Shortness of Breath   HPI: Parker Brown is a 67 y.o. male with medical history significant of COPD, hypertension, hyperlipidemia, chronic hepatitis C genotype 3, polysubstance abuse (alcohol, cocaine), who presents to the ED due to shortness of breath.  Parker Brown states that last night, he had rapid onset and progressively worsening shortness of breath and nonproductive cough.  Yesterday he did have 4 episodes of loose stool, but denies any diarrhea, nausea, vomiting, abdominal pain.  He denies any chest pain, palpitations or lower extremity swelling.  He notes that this is very similar to his last COPD exacerbation in March 2024.  He notes that he continues to smoke approximately 5 to cigarettes daily and uses inhalation cocaine intermittently.  ED course: On arrival to the ED, patient was hypertensive at 160/97 with heart rate of 71.  He was saturating at 100% with tachypnea 28/minute.  He was afebrile at 98.3.  Initial workup notable for normal VBG, CBC, BNP and BMP. Chest x-ray with no acute cardiopulmonary abnormality, however chronic smoking-related interstitial lung changes.  Respiratory viral panel pending.  Given significant tachypnea without notable improvement after breathing treatments, [facility] contacted for admission.  Review of Systems: As mentioned in the history of present illness. All other systems reviewed and are negative.  Past Medical History:  Diagnosis Date   Arthritis    COPD (chronic obstructive pulmonary disease) (HCC)    Hyperlipidemia    Hypertension    Past Surgical History:  Procedure Laterality Date   NO PAST SURGERIES     Social History:  reports that he has been smoking cigarettes. He has never used  smokeless tobacco. He reports current drug use. Drugs: Marijuana and "Crack" cocaine. He reports that he does not drink alcohol.  Allergies  Allergen Reactions   Benzoyl Peroxide Anaphylaxis   Fish Oil Anaphylaxis   Methocarbamol Anaphylaxis   Pineapple Anaphylaxis   Carbomer    Tuberculin, Ppd     Other Reaction(s): Eruption of skin   Tuberculin Purified Protein Derivative Hives and Rash    History reviewed. No pertinent family history.  Prior to Admission medications   Medication Sig Start Date End Date Taking? Authorizing Provider  acetaminophen (TYLENOL) 500 MG tablet Take 1,000 mg by mouth every 6 (six) hours as needed for mild pain. 07/16/21   [provider]  amLODipine (NORVASC) 10 MG tablet Take 10 mg by mouth daily.    [provider]  DULoxetine (CYMBALTA) 30 MG capsule Take 30 mg by mouth every 12 (twelve) hours.    [provider]  fluticasone-salmeterol (ADVAIR) 250-50 MCG/ACT AEPB Inhale 1 puff into the lungs in the morning and at bedtime.    [provider]  gabapentin (NEURONTIN) 300 MG capsule Take 300 mg by mouth 3 (three) times daily.    [provider]  prazosin (MINIPRESS) 1 MG capsule Take 3 mg by mouth at bedtime.    [provider]  traZODone (DESYREL) 100 MG tablet Take 100 mg by mouth at bedtime. 07/02/21   [provider]    Physical Exam: Vitals:   10/19/22 1315 10/19/22 1330 10/19/22 1345 10/19/22 1400  BP:   (!) 153/92 (!) 155/94  Pulse: 93 80 (!) 104 100  Resp: 18 (!) 30 (!)  32 (!) 25  Temp:      TempSrc:      SpO2: 100% 99% 99% 98%  Weight:      Height:       Physical Exam Vitals and nursing note reviewed.  Constitutional:      General: He is not in acute distress. HENT:     Head: Normocephalic and atraumatic.     Mouth/Throat:     Mouth: Mucous membranes are moist.     Pharynx: Oropharynx is clear.  Eyes:     Extraocular Movements: Extraocular movements intact.     Pupils:  Pupils are equal, round, and reactive to light.  Neck:     Vascular: No JVD.  Cardiovascular:     Rate and Rhythm: Normal rate and regular rhythm.  Pulmonary:     Effort: Tachypnea and accessory muscle usage present.     Breath sounds: Wheezing and rhonchi present. No decreased breath sounds or rales.  Musculoskeletal:     Cervical back: Neck supple.     Right lower leg: No edema.     Left lower leg: No edema.  Skin:    General: Skin is warm and dry.  Neurological:     General: No focal deficit present.     Mental Status: He is alert and oriented to person, place, and time.     Comments: No tremors  Psychiatric:        Mood and Affect: Mood normal.        Behavior: Behavior normal.    Data Reviewed: CBC with WBC of 7.8, hemoglobin of 14.2, MCV of 90.9, platelets of 260 BMP with sodium of 139, potassium 4.3, bicarb 25, glucose 101, creatinine 1.15 with GFR above 60. BNP 58  VBG with pH of 7.41 and pCO2 45  EKG personally reviewed.  Sinus rhythm with a rate of 71.  QT prolongation with QTc of 460 ms.  Results are pending, will review when available.  Assessment and Plan:  * COPD with acute exacerbation (HCC) Patient presenting with 1 day history of worsening SOB, wheezing and productive cough that has been unresponsive to home breathing treatments. Likely due to continued tobacco and inhalant cocaine use. Low suspicion for bacterial CAP at this time, so will hold off on antibiotics.   - Continuous pulse oximetry - S/p Solu-Medrol 125 mg once - Start prednisone 40 mg tomorrow to complete a 5-day course - RVP and procalcitonin pending - DuoNebs every 6 hours - Pulmicort every 12 hours   Polysubstance abuse (HCC) Patient reported intermittent use of alcohol (1-2 beers daily) and inhalation cocaine.   - CIWA without Ativan - Daily folic acid, thiamine and multivitamin  Chronic hepatitis C (HCC) Per chart review, patient was diagnosed with hepatitis C genotype 3 in 2023  at the [place] and prescribed antivirals, but it is unclear if patient started them.  - Hepatic function panel in the morning  Insomnia - Continue home regimen  HTN (hypertension) - Resume home regimen  Advance Care Planning:   Code Status: Full Code   Consults: None  Family Communication: No family at bedside  Severity of Illness: The appropriate patient status for this patient is OBSERVATION. Observation status is judged to be reasonable and necessary in order to provide the required intensity of service to ensure the patient's safety. The patient's presenting symptoms, physical exam findings, and initial radiographic and laboratory data in the context of their medical condition is felt to place them at decreased risk for further  clinical deterioration. Furthermore, it is anticipated that the patient will be medically stable for discharge from the hospital within 2 midnights of admission.   Author: Verdene Lennert, MD 10/19/2022 2:55 PM  For on call review [email or web address].

## 2022-10-19 NOTE — ED Notes (Signed)
Sandwich and water provided.

## 2022-10-19 NOTE — ED Notes (Signed)
Pt in xray

## 2022-10-19 NOTE — Assessment & Plan Note (Signed)
Resume home regimen.

## 2022-10-19 NOTE — Assessment & Plan Note (Signed)
-  Continue home regimen

## 2022-10-19 NOTE — Assessment & Plan Note (Signed)
Per chart review, patient was diagnosed with hepatitis C genotype 3 in 2023 at the [place] and prescribed antivirals, but it is unclear if patient started them.  - Hepatic function panel in the morning

## 2022-10-19 NOTE — ED Triage Notes (Signed)
Pt to ED from home [facility], SOB since last night PMH unknown per EMS 12 lead unremarkable X2 123 CBG, 165/98, HR 51,etCo2 was 23 then 30 after breathing treatments 20# L forearm 1 duoneb, 1 albuterol, 125mg  solu medrol  Cannot speak in full sentences Labored, tachypneic, current smoker Walks with cane

## 2022-10-19 NOTE — ED Provider Notes (Signed)
[facility] Provider Note    Event Date/Time   First MD Initiated Contact with Patient 10/19/22 1204     (approximate)   History   Shortness of Breath   HPI Parker Brown is a 67 y.o. male presenting today for shortness of breath.  Patient has a history of COPD not normally on oxygen but does admit to still smoking.  Over the past day he has had worsening shortness of breath with wheezing and productive cough with clear sputum.  He has tried albuterol at home without significant relief.  Otherwise, denies fever, chills, chest pain, nausea, vomiting, abdominal pain, leg pain, leg swelling.  Chart reviewed showing admission on 03/15/2022 for COPD exacerbation with hypertension.     Physical Exam   Triage Vital Signs: ED Triage Vitals  Encounter Vitals Group     BP 10/19/22 1205 (!) 160/97     Systolic BP Percentile --      Diastolic BP Percentile --      Pulse Rate 10/19/22 1205 66     Resp 10/19/22 1205 (!) 30     Temp 10/19/22 1205 98.3 F (36.8 C)     Temp Source 10/19/22 1205 Oral     SpO2 10/19/22 1203 99 %     Weight 10/19/22 1203 150 lb (68 kg)     Height 10/19/22 1203 5\' 6"  (1.676 m)     Head Circumference --      Peak Flow --      Pain Score 10/19/22 1204 0     Pain Loc --      Pain Education --      Exclude from Growth Chart --     Most recent vital signs: Vitals:   10/19/22 1345 10/19/22 1400  BP: (!) 153/92 (!) 155/94  Pulse: (!) 104 100  Resp: (!) 32 (!) 25  Temp:    SpO2: 99% 98%   Physical Exam: I have reviewed the vital signs and nursing notes. General: Awake, alert, no acute distress.  Nontoxic appearing. Head:  Atraumatic, normocephalic.   ENT:  EOM intact, PERRL. Oral mucosa is pink and moist with no lesions. Neck: Neck is supple with full range of motion, No meningeal signs. Cardiovascular:  RRR, No murmurs. Peripheral pulses palpable and equal bilaterally. Respiratory:  Symmetrical chest wall expansion.   Scattered crackles and expiratory wheezing noted throughout. Musculoskeletal:  No cyanosis or edema. Moving extremities with full ROM Abdomen:  Soft, nontender, nondistended. Neuro:  GCS 15, moving all four extremities, interacting appropriately. Speech clear. Psych:  Calm, appropriate.   Skin:  Warm, dry, no rash.    ED Results / Procedures / Treatments   Labs (all labs ordered are listed, but only abnormal results are displayed) Labs Reviewed  BASIC METABOLIC PANEL - Abnormal; Notable for the following components:      Result Value   Glucose, Bld 101 (*)    All other components within normal limits  BLOOD GAS, VENOUS - Abnormal; Notable for the following components:   Bicarbonate 28.5 (*)    Acid-Base Excess 3.2 (*)    All other components within normal limits  RESP PANEL BY RT-PCR (RSV, FLU A&B, COVID)  [id number]  RESPIRATORY PANEL BY PCR  CBC  BRAIN NATRIURETIC PEPTIDE  PROCALCITONIN     EKG My EKG interpretation: Heart rate 71, normal sinus rhythm, left axis deviation.  No acute ST elevations or depressions   RADIOLOGY Independently interpreted chest x-ray with no acute pathology  PROCEDURES:  Critical Care performed: No  Procedures   MEDICATIONS ORDERED IN ED: Medications  enoxaparin (LOVENOX) injection 40 mg (40 mg Subcutaneous Given 10/19/22 1608)  sodium chloride flush (NS) 0.9 % injection 3 mL (3 mLs Intravenous Given 10/19/22 1609)  predniSONE (DELTASONE) tablet 40 mg (has no administration in time range)  ipratropium-albuterol (DUONEB) 0.5-2.5 (3) MG/3ML nebulizer solution 3 mL (3 mLs Nebulization Given 10/19/22 1607)  budesonide (PULMICORT) nebulizer solution 2 mg (has no administration in time range)  acetaminophen (TYLENOL) tablet 650 mg (has no administration in time range)  ondansetron (ZOFRAN) injection 4 mg (has no administration in time range)  polyethylene glycol (MIRALAX / GLYCOLAX) packet 17 g (has no administration in time range)  albuterol  (PROVENTIL) (2.5 MG/3ML) 0.083% nebulizer solution 10 mg (10 mg Nebulization Given 10/19/22 1235)  ipratropium (ATROVENT) nebulizer solution 0.5 mg (0.5 mg Nebulization Given 10/19/22 1235)     IMPRESSION / MDM / ASSESSMENT AND PLAN / ED COURSE  I reviewed the triage vital signs and the nursing notes.                              Differential diagnosis includes, but is not limited to, COPD exacerbation, pneumonia, viral upper respiratory infection  Patient's presentation is most consistent with severe exacerbation of chronic illness.  Patient is a 67 year old male presenting today for shortness of breath and wheezing.  Wheezing noted on initial exam with tachypnea.  No hypoxia present.  Concern for COPD exacerbation.  Patient already given albuterol, Atrovent, and Solu-Medrol with EMS.  Patient was given an additional hour-long albuterol and Atrovent treatment.  Laboratory workup largely reassuring with no obvious evidence of overt pneumonia or other concerning pathology.  Patient was reassessed and still had tachypnea and wheezing present at this time.  Given ongoing symptoms, will admit to hospitalist for COPD exacerbation and further care.  The patient is on the cardiac monitor to evaluate for evidence of arrhythmia and/or significant heart rate changes. Clinical Course as of 10/19/22 1635  Sun Oct 19, 2022  1334 DG Chest 2 View No acute cardiopulmonary pathology [DW]  1349 Reassessed patient.  No hypoxia but is still having significant work of breathing with respiratory rate in the upper 20s.  Wheezing still noted after hour-long treatment which is his second breathing treatment today.  Will plan to admit for COPD exacerbation. [DW]    Clinical Course User Index [DW] Janith Lima, MD     FINAL CLINICAL IMPRESSION(S) / ED DIAGNOSES   Final diagnoses:  COPD exacerbation (HCC)     Rx / DC Orders   ED Discharge Orders     None        Note:  This document was prepared  using Dragon voice recognition software and may include unintentional dictation errors.   Janith Lima, MD 10/19/22 [phone]

## 2022-10-19 NOTE — Assessment & Plan Note (Signed)
Patient presenting with 1 day history of worsening SOB, wheezing and productive cough that has been unresponsive to home breathing treatments. Likely due to continued tobacco and inhalant cocaine use. Low suspicion for bacterial CAP at this time, so will hold off on antibiotics.   - Continuous pulse oximetry - S/p Solu-Medrol 125 mg once - Start prednisone 40 mg tomorrow to complete a 5-day course - RVP and procalcitonin pending - DuoNebs every 6 hours - Pulmicort every 12 hours

## 2022-10-19 NOTE — Assessment & Plan Note (Addendum)
Patient reported intermittent use of alcohol (1-2 beers daily) and inhalation cocaine.   - CIWA without Ativan - Daily folic acid, thiamine and multivitamin

## 2022-10-19 NOTE — ED Notes (Signed)
EDP at bedside. Pt coughing some. Denies CHF, only COPD. Has not been taking albuterol or anything at home.

## 2022-10-20 DIAGNOSIS — J441 Chronic obstructive pulmonary disease with (acute) exacerbation: Secondary | ICD-10-CM | POA: Diagnosis not present

## 2022-10-20 LAB — CBC WITH DIFFERENTIAL/PLATELET
Abs Immature Granulocytes: 0.02 10*3/uL (ref 0.00–0.07)
Basophils Absolute: 0 10*3/uL (ref 0.0–0.1)
Basophils Relative: 0 %
Eosinophils Absolute: 0 10*3/uL (ref 0.0–0.5)
Eosinophils Relative: 0 %
HCT: 35.2 % — ABNORMAL LOW (ref 39.0–52.0)
Hemoglobin: 11.9 g/dL — ABNORMAL LOW (ref 13.0–17.0)
Immature Granulocytes: 0 %
Lymphocytes Relative: 16 %
Lymphs Abs: 1.2 10*3/uL (ref 0.7–4.0)
MCH: 30 pg (ref 26.0–34.0)
MCHC: 33.8 g/dL (ref 30.0–36.0)
MCV: 88.7 fL (ref 80.0–100.0)
Monocytes Absolute: 0.6 10*3/uL (ref 0.1–1.0)
Monocytes Relative: 9 %
Neutro Abs: 5.6 10*3/uL (ref 1.7–7.7)
Neutrophils Relative %: 75 %
Platelets: 240 10*3/uL (ref 150–400)
RBC: 3.97 MIL/uL — ABNORMAL LOW (ref 4.22–5.81)
RDW: 14.6 % (ref 11.5–15.5)
WBC: 7.4 10*3/uL (ref 4.0–10.5)
nRBC: 0 % (ref 0.0–0.2)

## 2022-10-20 LAB — RESPIRATORY PANEL BY PCR

## 2022-10-20 LAB — CBG MONITORING, ED: Glucose-Capillary: 113 mg/dL — ABNORMAL HIGH (ref 70–99)

## 2022-10-20 LAB — HIV ANTIBODY (ROUTINE TESTING W REFLEX): HIV Screen 4th Generation wRfx: NONREACTIVE

## 2022-10-20 MED ORDER — PREDNISONE 20 MG PO TABS: 40.0000 mg | ORAL_TABLET | Freq: Every day | ORAL | 0 refills | Status: AC

## 2022-10-20 MED ORDER — BUDESONIDE 0.5 MG/2ML IN SUSP
RESPIRATORY_TRACT | Status: AC
  Administered 2022-10-20: 2 mg via RESPIRATORY_TRACT
  Filled 2022-10-20: qty 2

## 2022-10-20 MED ORDER — LEVALBUTEROL TARTRATE 45 MCG/ACT IN AERO: 2.0000 | INHALATION_SPRAY | Freq: Four times a day (QID) | RESPIRATORY_TRACT | 2 refills | Status: DC | PRN

## 2022-10-20 MED ORDER — POLYETHYLENE GLYCOL 3350 17 G PO PACK: 17.0000 g | PACK | Freq: Every day | ORAL | 0 refills | Status: DC | PRN

## 2022-10-20 MED ORDER — TRAZODONE HCL 100 MG PO TABS: 100.0000 mg | ORAL_TABLET | Freq: Every day | ORAL | 0 refills | Status: DC

## 2022-10-20 MED ORDER — PREDNISONE 20 MG PO TABS: 40.0000 mg | ORAL_TABLET | Freq: Every day | ORAL | 0 refills | Status: DC

## 2022-10-20 MED ORDER — POLYETHYLENE GLYCOL 3350 17 G PO PACK: 17.0000 g | PACK | Freq: Every day | ORAL | 0 refills | Status: AC | PRN

## 2022-10-20 MED ORDER — GUAIFENESIN ER 600 MG PO TB12
1200.0000 mg | ORAL_TABLET | Freq: Two times a day (BID) | ORAL | 0 refills | Status: DC
Start: 2022-10-20 — End: 2022-10-20

## 2022-10-20 MED ORDER — GUAIFENESIN ER 600 MG PO TB12: 1200.0000 mg | ORAL_TABLET | Freq: Two times a day (BID) | ORAL | 0 refills | Status: DC

## 2022-10-20 NOTE — Discharge Summary (Signed)
Physician Discharge Summary   Patient: Parker Brown MRN: [id number] DOB: [birth year]  Admit date:     10/19/2022  Discharge date: 10/20/22  Discharge Physician: Loyce Dys   PCP: Pcp, No   Recommendations at discharge:  Follow-up with primary care physician  Discharge Diagnoses: COPD with acute exacerbation (HCC) Polysubstance abuse (HCC) Chronic hepatitis C (HCC) Insomnia HTN (hypertension)  Hospital Course:  Parker Brown is a 67 y.o. male with medical history significant of COPD, hypertension, hyperlipidemia, chronic hepatitis C genotype 3, polysubstance abuse (alcohol, cocaine), who presents to the ED due to shortness of breath.   Parker Brown states that the night before presentation, he had rapid onset and progressively worsening shortness of breath and nonproductive cough.  He notes that this is very similar to his last COPD exacerbation in March 2024.  He notes that he continues to smoke approximately 5 to cigarettes daily and uses inhalation cocaine intermittently.   ED course: On arrival to the ED, patient was hypertensive at 160/97 with heart rate of 71.  He was saturating at 100% with tachypnea 28/minute.  He was afebrile at 98.3.  Initial workup notable for normal VBG, CBC, BNP and BMP. Chest x-ray with no acute cardiopulmonary abnormality, however chronic smoking-related interstitial lung changes.  Respiratory viral panel pending.  Given significant tachypnea without notable improvement after breathing treatments, [facility] contacted for admission.  Respiratory panel showed rhinovirus positive.  Patient's respiratory function improved was able to walk about without desaturating and therefore has been cleared for discharge today to follow-up with primary care physician  Consultants: None Procedures performed: None Disposition: Home Diet recommendation:  Cardiac diet DISCHARGE MEDICATION: Allergies as of 10/20/2022       Reactions   Benzoyl Peroxide Anaphylaxis    Fish Oil Anaphylaxis   Methocarbamol Anaphylaxis   Pineapple Anaphylaxis   Carbomer    Tuberculin, Ppd    Other Reaction(s): Eruption of skin   Tuberculin Purified Protein Derivative Hives, Rash        Medication List     STOP taking these medications    prazosin 1 MG capsule Commonly known as: MINIPRESS       TAKE these medications    acetaminophen 500 MG tablet Commonly known as: TYLENOL Take 1,000 mg by mouth every 6 (six) hours as needed for mild pain.   amLODipine 10 MG tablet Commonly known as: NORVASC Take 10 mg by mouth daily.   DULoxetine 30 MG capsule Commonly known as: CYMBALTA Take 30 mg by mouth every 12 (twelve) hours.   fluticasone-salmeterol 250-50 MCG/ACT Aepb Commonly known as: ADVAIR Inhale 1 puff into the lungs in the morning and at bedtime.   gabapentin 300 MG capsule Commonly known as: NEURONTIN Take 300 mg by mouth 3 (three) times daily.   guaiFENesin 600 MG 12 hr tablet Commonly known as: Mucinex Take 2 tablets (1,200 mg total) by mouth 2 (two) times daily.   levalbuterol 45 MCG/ACT inhaler Commonly known as: XOPENEX HFA Inhale 2 puffs into the lungs every 6 (six) hours as needed for wheezing.   polyethylene glycol 17 g packet Commonly known as: MIRALAX / GLYCOLAX Take 17 g by mouth daily as needed for mild constipation.   predniSONE 20 MG tablet Commonly known as: DELTASONE Take 2 tablets (40 mg total) by mouth daily with breakfast for 4 days. Start taking on: October 21, 2022   traZODone 100 MG tablet Commonly known as: DESYREL Take 1 tablet (100 mg total) by mouth at bedtime.  Discharge Exam: Filed Weights   10/19/22 1203  Weight: 68 kg   HENT:     Head: Normocephalic and atraumatic.     Mouth/Throat:     Mouth: Mucous membranes are moist.     Pharynx: Oropharynx is clear.  Eyes:     Extraocular Movements: Extraocular movements intact.     Pupils: Pupils are equal, round, and reactive to light.  Neck:      Vascular: No JVD.  Cardiovascular:     Rate and Rhythm: Normal rate and regular rhythm.  Pulmonary: Clear to auscultation Musculoskeletal:     Cervical back: Neck supple.     Right lower leg: No edema.     Left lower leg: No edema.  Skin:    General: Skin is warm and dry.  Neurological:     General: No focal deficit present.     Mental Status: He is alert and oriented to person, place, and time.     Comments: No tremors  Psychiatric:        Mood and Affect: Mood normal.        Behavior: Behavior normal.   Condition at discharge: good  The results of significant diagnostics from this hospitalization (including imaging, microbiology, ancillary and laboratory) are listed below for reference.   Imaging Studies: DG Chest 2 View  Result Date: 10/19/2022 CLINICAL DATA:  67 year old male with shortness of breath since last night. Smoker. COPD. EXAM: CHEST - 2 VIEW COMPARISON:  Chest radiographs 03/15/2022 and earlier. FINDINGS: AP and lateral views at 1211 hours. Stable chronic tortuosity of the thoracic aorta. Other mediastinal contours are within normal limits. Visualized tracheal air column is within normal limits. Mildly coarse chronic bilateral pulmonary interstitial markings appear stable since 2020. No pneumothorax, pulmonary edema, pleural effusion or confluent lung opacity. Incidental nipple shadows. No acute osseous abnormality identified. Negative visible bowel gas. IMPRESSION: No acute cardiopulmonary abnormality. Chronic likely smoking related interstitial lung changes. Electronically Signed   By: Odessa Fleming M.D.   On: 10/19/2022 13:31    Microbiology: Results for orders placed or performed during the hospital encounter of 10/19/22  Resp panel by RT-PCR (RSV, Flu A&B, Covid) Anterior Nasal Swab     Status: None   Collection Time: 10/19/22  1:55 PM   Specimen: Anterior Nasal Swab  Result Value Ref Range Status   SARS Coronavirus 2 by RT PCR NEGATIVE NEGATIVE Final    Comment:  (NOTE) SARS-CoV-2 target nucleic acids are NOT DETECTED.  The SARS-CoV-2 RNA is generally detectable in upper respiratory specimens during the acute phase of infection. The lowest concentration of SARS-CoV-2 viral copies this assay can detect is 138 copies/mL. A negative result does not preclude SARS-Cov-2 infection and should not be used as the sole basis for treatment or other patient management decisions. A negative result may occur with  improper specimen collection/handling, submission of specimen other than nasopharyngeal swab, presence of viral mutation(s) within the areas targeted by this assay, and inadequate number of viral copies(<138 copies/mL). A negative result must be combined with clinical observations, patient history, and epidemiological information. The expected result is Negative.  Fact Sheet for Patients:  BloggerCourse.com  Fact Sheet for Healthcare Providers:  SeriousBroker.it  This test is no t yet approved or cleared by the [place] FDA and  has been authorized for detection and/or diagnosis of SARS-CoV-2 by FDA under an Emergency Use Authorization (EUA). This EUA will remain  in effect (meaning this test can be used) for the duration of  the COVID-19 declaration under Section 564(b)(1) of the Act, 21 U.S.C.section 360bbb-3(b)(1), unless the authorization is terminated  or revoked sooner.       Influenza A by PCR NEGATIVE NEGATIVE Final   Influenza B by PCR NEGATIVE NEGATIVE Final    Comment: (NOTE) The Xpert Xpress SARS-CoV-2/FLU/RSV plus assay is intended as an aid in the diagnosis of influenza from Nasopharyngeal swab specimens and should not be used as a sole basis for treatment. Nasal washings and aspirates are unacceptable for Xpert Xpress SARS-CoV-2/FLU/RSV testing.  Fact Sheet for Patients: BloggerCourse.com  Fact Sheet for Healthcare  Providers: SeriousBroker.it  This test is not yet approved or cleared by the [place] FDA and has been authorized for detection and/or diagnosis of SARS-CoV-2 by FDA under an Emergency Use Authorization (EUA). This EUA will remain in effect (meaning this test can be used) for the duration of the COVID-19 declaration under Section 564(b)(1) of the Act, 21 U.S.C. section 360bbb-3(b)(1), unless the authorization is terminated or revoked.     Resp Syncytial Virus by PCR NEGATIVE NEGATIVE Final    Comment: (NOTE) Fact Sheet for Patients: BloggerCourse.com  Fact Sheet for Healthcare Providers: SeriousBroker.it  This test is not yet approved or cleared by the [place] FDA and has been authorized for detection and/or diagnosis of SARS-CoV-2 by FDA under an Emergency Use Authorization (EUA). This EUA will remain in effect (meaning this test can be used) for the duration of the COVID-19 declaration under Section 564(b)(1) of the Act, 21 U.S.C. section 360bbb-3(b)(1), unless the authorization is terminated or revoked.  Performed at [facility], [address] [place], [place] [address]   Respiratory (~20 pathogens) panel by PCR     Status: Abnormal   Collection Time: 10/19/22  6:50 PM   Specimen: Nasopharyngeal Swab; Respiratory  Result Value Ref Range Status   Adenovirus NOT DETECTED NOT DETECTED Final   Coronavirus 229E NOT DETECTED NOT DETECTED Final    Comment: (NOTE) The Coronavirus on the Respiratory Panel, DOES NOT test for the novel  Coronavirus (2019 nCoV)    Coronavirus HKU1 NOT DETECTED NOT DETECTED Final   Coronavirus NL63 NOT DETECTED NOT DETECTED Final   Coronavirus OC43 NOT DETECTED NOT DETECTED Final   Metapneumovirus NOT DETECTED NOT DETECTED Final   Rhinovirus / Enterovirus DETECTED (A) NOT DETECTED Final   Influenza A NOT DETECTED NOT DETECTED Final   Influenza B NOT  DETECTED NOT DETECTED Final   Parainfluenza Virus 1 NOT DETECTED NOT DETECTED Final   Parainfluenza Virus 2 NOT DETECTED NOT DETECTED Final   Parainfluenza Virus 3 NOT DETECTED NOT DETECTED Final   Parainfluenza Virus 4 NOT DETECTED NOT DETECTED Final   Respiratory Syncytial Virus NOT DETECTED NOT DETECTED Final   Bordetella pertussis NOT DETECTED NOT DETECTED Final   Bordetella Parapertussis NOT DETECTED NOT DETECTED Final   Chlamydophila pneumoniae NOT DETECTED NOT DETECTED Final   Mycoplasma pneumoniae NOT DETECTED NOT DETECTED Final    Comment: Performed at [facility], [address] [place], [place] [address]    Labs: CBC: Recent Labs  Lab 10/19/22 1205 10/20/22 0410  WBC 7.8 7.4  NEUTROABS  --  5.6  HGB 14.2 11.9*  HCT 43.1 35.2*  MCV 90.9 88.7  PLT 260 240   Basic Metabolic Panel: Recent Labs  Lab 10/19/22 1205  NA 139  K 4.3  CL 104  CO2 25  GLUCOSE 101*  BUN 13  CREATININE 1.15  CALCIUM 8.9   Liver Function  Tests: No results for input(s): "AST", "ALT", "ALKPHOS", "BILITOT", "PROT", "ALBUMIN" in the last 168 hours. CBG: Recent Labs  Lab 10/20/22 0816  GLUCAP 113*    Discharge time spent:  .  Signed: Loyce Dys, MD [facility] 10/20/2022

## 2023-06-08 ENCOUNTER — Emergency Department

## 2023-06-08 ENCOUNTER — Inpatient Hospital Stay
Admission: EM | Admit: 2023-06-08 | Discharge: 2023-06-12 | DRG: 192 | Disposition: A | Discharge status: Home / Self Care | Attending: Student | Admitting: Student

## 2023-06-08 ENCOUNTER — Other Ambulatory Visit: Payer: Self-pay

## 2023-06-08 ENCOUNTER — Encounter: Payer: Self-pay | Admitting: Intensive Care

## 2023-06-08 DIAGNOSIS — Z888 Allergy status to other drugs, medicaments and biological substances status: Secondary | ICD-10-CM

## 2023-06-08 DIAGNOSIS — E876 Hypokalemia: Secondary | ICD-10-CM | POA: Diagnosis present

## 2023-06-08 DIAGNOSIS — Z91018 Allergy to other foods: Secondary | ICD-10-CM

## 2023-06-08 DIAGNOSIS — E538 Deficiency of other specified B group vitamins: Secondary | ICD-10-CM | POA: Diagnosis present

## 2023-06-08 DIAGNOSIS — I10 Essential (primary) hypertension: Secondary | ICD-10-CM | POA: Diagnosis present

## 2023-06-08 DIAGNOSIS — F17210 Nicotine dependence, cigarettes, uncomplicated: Secondary | ICD-10-CM | POA: Diagnosis present

## 2023-06-08 DIAGNOSIS — Z79899 Other long term (current) drug therapy: Secondary | ICD-10-CM

## 2023-06-08 DIAGNOSIS — Z91148 Patient's other noncompliance with medication regimen for other reason: Secondary | ICD-10-CM

## 2023-06-08 DIAGNOSIS — Z91013 Allergy to seafood: Secondary | ICD-10-CM

## 2023-06-08 DIAGNOSIS — Z91048 Other nonmedicinal substance allergy status: Secondary | ICD-10-CM

## 2023-06-08 DIAGNOSIS — J441 Chronic obstructive pulmonary disease with (acute) exacerbation: Principal | ICD-10-CM | POA: Diagnosis present

## 2023-06-08 DIAGNOSIS — Z883 Allergy status to other anti-infective agents status: Secondary | ICD-10-CM

## 2023-06-08 DIAGNOSIS — Z8619 Personal history of other infectious and parasitic diseases: Secondary | ICD-10-CM

## 2023-06-08 DIAGNOSIS — I160 Hypertensive urgency: Secondary | ICD-10-CM | POA: Diagnosis present

## 2023-06-08 DIAGNOSIS — Z1152 Encounter for screening for COVID-19: Secondary | ICD-10-CM

## 2023-06-08 DIAGNOSIS — R0602 Shortness of breath: Secondary | ICD-10-CM | POA: Diagnosis not present

## 2023-06-08 DIAGNOSIS — F1490 Cocaine use, unspecified, uncomplicated: Secondary | ICD-10-CM | POA: Diagnosis present

## 2023-06-08 DIAGNOSIS — E785 Hyperlipidemia, unspecified: Secondary | ICD-10-CM | POA: Diagnosis present

## 2023-06-08 DIAGNOSIS — Z7951 Long term (current) use of inhaled steroids: Secondary | ICD-10-CM

## 2023-06-08 DIAGNOSIS — M1990 Unspecified osteoarthritis, unspecified site: Secondary | ICD-10-CM | POA: Diagnosis present

## 2023-06-08 LAB — BASIC METABOLIC PANEL WITH GFR
Anion gap: 9 (ref 5–15)
BUN: 23 mg/dL (ref 8–23)
CO2: 22 mmol/L (ref 22–32)
Calcium: 8.3 mg/dL — ABNORMAL LOW (ref 8.9–10.3)
Chloride: 108 mmol/L (ref 98–111)
Creatinine, Ser: 1.28 mg/dL — ABNORMAL HIGH (ref 0.61–1.24)
GFR, Estimated: >60 mL/min (ref >60)
Glucose, Bld: 86 mg/dL (ref 70–99)
Potassium: 4 mmol/L (ref 3.5–5.1)
Sodium: 139 mmol/L (ref 135–145)

## 2023-06-08 LAB — RESP PANEL BY RT-PCR (RSV, FLU A&B, COVID)  RVPGX2
Influenza A by PCR: NEGATIVE
Influenza B by PCR: NEGATIVE
Resp Syncytial Virus by PCR: NEGATIVE
SARS Coronavirus 2 by RT PCR: NEGATIVE

## 2023-06-08 LAB — CBC
HCT: 38.3 % — ABNORMAL LOW (ref 39.0–52.0)
Hemoglobin: 12.4 g/dL — ABNORMAL LOW (ref 13.0–17.0)
MCH: 29.8 pg (ref 26.0–34.0)
MCHC: 32.4 g/dL (ref 30.0–36.0)
MCV: 92.1 fL (ref 80.0–100.0)
Platelets: 210 10*3/uL (ref 150–400)
RBC: 4.16 MIL/uL — ABNORMAL LOW (ref 4.22–5.81)
RDW: 14.4 % (ref 11.5–15.5)
WBC: 6.3 10*3/uL (ref 4.0–10.5)
nRBC: 0 % (ref 0.0–0.2)

## 2023-06-08 LAB — BRAIN NATRIURETIC PEPTIDE: B Natriuretic Peptide: 54.7 pg/mL (ref 0.0–100.0)

## 2023-06-08 LAB — TROPONIN I (HIGH SENSITIVITY)
Troponin I (High Sensitivity): 12 ng/L (ref <18)
Troponin I (High Sensitivity): 11 ng/L (ref <18)

## 2023-06-08 MED ORDER — ALBUTEROL SULFATE (2.5 MG/3ML) 0.083% IN NEBU
2.5000 mg | INHALATION_SOLUTION | RESPIRATORY_TRACT | Status: DC | PRN
  Administered 2023-06-09 – 2023-06-10 (×2): 2.5 mg via RESPIRATORY_TRACT
  Filled 2023-06-08 (×2): qty 3

## 2023-06-08 MED ORDER — AMLODIPINE BESYLATE 5 MG PO TABS
10.0000 mg | ORAL_TABLET | Freq: Once | ORAL | Status: AC
  Administered 2023-06-08: 10 mg via ORAL
  Filled 2023-06-08: qty 2

## 2023-06-08 MED ORDER — ALBUTEROL SULFATE (2.5 MG/3ML) 0.083% IN NEBU
2.5000 mg | INHALATION_SOLUTION | Freq: Once | RESPIRATORY_TRACT | Status: AC
  Administered 2023-06-08: 2.5 mg via RESPIRATORY_TRACT
  Filled 2023-06-08: qty 3

## 2023-06-08 MED ORDER — PREDNISONE 20 MG PO TABS
60.0000 mg | ORAL_TABLET | Freq: Once | ORAL | Status: AC
  Administered 2023-06-08: 60 mg via ORAL
  Filled 2023-06-08: qty 3

## 2023-06-08 MED ORDER — PREDNISONE 20 MG PO TABS
40.0000 mg | ORAL_TABLET | Freq: Every day | ORAL | Status: AC
  Administered 2023-06-09 – 2023-06-12 (×4): 40 mg via ORAL
  Filled 2023-06-08 (×4): qty 2

## 2023-06-08 MED ORDER — METHYLPREDNISOLONE SODIUM SUCC 40 MG IJ SOLR
40.0000 mg | Freq: Two times a day (BID) | INTRAMUSCULAR | Status: AC
  Administered 2023-06-08 – 2023-06-09 (×2): 40 mg via INTRAVENOUS
  Filled 2023-06-08 (×3): qty 1

## 2023-06-08 MED ORDER — ACETAMINOPHEN 650 MG RE SUPP: 650.0000 mg | Freq: Four times a day (QID) | RECTAL | Status: DC | PRN

## 2023-06-08 MED ORDER — IPRATROPIUM-ALBUTEROL 0.5-2.5 (3) MG/3ML IN SOLN
3.0000 mL | Freq: Three times a day (TID) | RESPIRATORY_TRACT | Status: DC
  Administered 2023-06-09: 3 mL via RESPIRATORY_TRACT
  Filled 2023-06-08: qty 3

## 2023-06-08 MED ORDER — ENOXAPARIN SODIUM 40 MG/0.4ML IJ SOSY
40.0000 mg | PREFILLED_SYRINGE | INTRAMUSCULAR | Status: DC
Start: 2023-06-08 — End: 2023-06-08

## 2023-06-08 MED ORDER — IPRATROPIUM-ALBUTEROL 0.5-2.5 (3) MG/3ML IN SOLN
3.0000 mL | Freq: Four times a day (QID) | RESPIRATORY_TRACT | Status: DC
  Administered 2023-06-08: 3 mL via RESPIRATORY_TRACT
  Filled 2023-06-08 (×2): qty 3

## 2023-06-08 MED ORDER — DOXYCYCLINE HYCLATE 100 MG PO TABS
100.0000 mg | ORAL_TABLET | Freq: Two times a day (BID) | ORAL | Status: DC
  Administered 2023-06-08 – 2023-06-12 (×9): 100 mg via ORAL
  Filled 2023-06-08 (×9): qty 1

## 2023-06-08 MED ORDER — AMLODIPINE BESYLATE 10 MG PO TABS
10.0000 mg | ORAL_TABLET | Freq: Every day | ORAL | Status: DC
  Administered 2023-06-09 – 2023-06-12 (×4): 10 mg via ORAL
  Filled 2023-06-08 (×4): qty 1

## 2023-06-08 MED ORDER — DM-GUAIFENESIN ER 30-600 MG PO TB12
1.0000 | ORAL_TABLET | Freq: Two times a day (BID) | ORAL | Status: DC
  Administered 2023-06-08 – 2023-06-10 (×4): 1 via ORAL
  Filled 2023-06-08 (×4): qty 1

## 2023-06-08 MED ORDER — IPRATROPIUM-ALBUTEROL 0.5-2.5 (3) MG/3ML IN SOLN
3.0000 mL | Freq: Once | RESPIRATORY_TRACT | Status: AC
  Administered 2023-06-08: 3 mL via RESPIRATORY_TRACT
  Filled 2023-06-08: qty 3

## 2023-06-08 MED ORDER — ACETAMINOPHEN 325 MG PO TABS
650.0000 mg | ORAL_TABLET | Freq: Four times a day (QID) | ORAL | Status: DC | PRN
  Administered 2023-06-08 – 2023-06-11 (×4): 650 mg via ORAL
  Filled 2023-06-08 (×4): qty 2

## 2023-06-08 MED ORDER — ENOXAPARIN SODIUM 40 MG/0.4ML IJ SOSY
40.0000 mg | PREFILLED_SYRINGE | INTRAMUSCULAR | Status: DC
  Administered 2023-06-08 – 2023-06-10 (×3): 40 mg via SUBCUTANEOUS
  Filled 2023-06-08 (×4): qty 0.4

## 2023-06-08 MED ORDER — ALBUTEROL SULFATE (2.5 MG/3ML) 0.083% IN NEBU
5.0000 mg | INHALATION_SOLUTION | Freq: Once | RESPIRATORY_TRACT | Status: AC
  Administered 2023-06-08: 5 mg via RESPIRATORY_TRACT
  Filled 2023-06-08: qty 6

## 2023-06-08 NOTE — ED Notes (Signed)
Pt refusing respiratory swab

## 2023-06-08 NOTE — ED Provider Notes (Signed)
 [facility] Provider Note   Event Date/Time   First MD Initiated Contact with Patient 06/08/23 1039     (approximate)  History   Shortness of Breath  HPI  Parker Brown is a 68 y.o. male history of COPD hepatitis C, polysubstance abuse  Reports a little bit last night and then this morning he started having feeling of wheezing and tightness in his chest.  Reports history of COPD.  He also reports use of recent cocaine   He is not have any chest pain.  No leg swelling.  He has his medications but "sometimes takes", but has not been taking any recently  No fevers or chills.  Reports some cough and wheezing.     Physical Exam   Triage Vital Signs: ED Triage Vitals  Encounter Vitals Group     BP 06/08/23 1032 (!) 157/107     Systolic BP Percentile --      Diastolic BP Percentile --      Pulse Rate 06/08/23 1028 65     Resp 06/08/23 1032 (!) 26     Temp 06/08/23 1032 98.2 F (36.8 C)     Temp Source 06/08/23 1032 Oral     SpO2 06/08/23 1028 98 %     Weight 06/08/23 1029 150 lb (68 kg)     Height 06/08/23 1029 5\' 5"  (1.651 m)     Head Circumference --      Peak Flow --      Pain Score 06/08/23 1029 7     Pain Loc --      Pain Education --      Exclude from Growth Chart --     Most recent vital signs: Vitals:   06/08/23 1300 06/08/23 1430  BP: (!) 168/109 (!) 145/85  Pulse: 79 73  Resp: (!) 29 17  Temp:    SpO2: 100% 95%     General: Awake, no distress except mild tachypnea with slight accessory muscle use. CV:  Good peripheral perfusion.  Normal rate and tones Resp:  Mild tachypnea with mild accessory muscle use, moderate expiratory wheezing throughout.  Appears to have a mild increased work of breathing.  In no acute extremis Abd:  No distention.  Soft nontender Other:  No lower extremity edema venous cords or congestion.  Normal oxygen saturation on room air.   ED Results / Procedures / Treatments   Labs (all labs  ordered are listed, but only abnormal results are displayed) Labs Reviewed  BASIC METABOLIC PANEL WITH GFR - Abnormal; Notable for the following components:      Result Value   Creatinine, Ser 1.28 (*)    Calcium 8.3 (*)    All other components within normal limits  CBC - Abnormal; Notable for the following components:   RBC 4.16 (*)    Hemoglobin 12.4 (*)    HCT 38.3 (*)    All other components within normal limits  RESP PANEL BY RT-PCR (RSV, FLU A&B, COVID)  [id number]  BRAIN NATRIURETIC PEPTIDE  CBC  CREATININE, SERUM  TROPONIN I (HIGH SENSITIVITY)  TROPONIN I (HIGH SENSITIVITY)     EKG  Multiple attempts made to get good ECG, difficult by patient movement.  Overall appears to be sinus rhythm without clear evidence of ischemia.  Probable underlying LVH or repolarization abnormality though again hard to determine due to artifact   RADIOLOGY   X-ray inter by me is negative for acute finding   DG Chest [facility] 1  View Result Date: 06/08/2023 CLINICAL DATA:  Shortness of breath. EXAM: PORTABLE CHEST 1 VIEW COMPARISON:  Radiographs 10/19/2022 and 03/15/2022. FINDINGS: 1052 hours. The heart size and mediastinal contours are normal. The lungs are clear. There is no pleural effusion or pneumothorax. No acute osseous findings are identified. Telemetry leads overlie the chest. IMPRESSION: No evidence of acute cardiopulmonary process. Electronically Signed   By: Elmon Hagedorn M.D.   On: 06/08/2023 12:14     PROCEDURES:  Critical Care performed: Yes, see critical care procedure note(s)  CRITICAL CARE Performed by: Iver Marker   Total critical care time: 30 minutes  Critical care time was exclusive of separately billable procedures and treating other patients.  Critical care was necessary to treat or prevent imminent or life-threatening deterioration.  Critical care was time spent personally by me on the following activities: development of treatment plan with patient and/or  surrogate as well as nursing, discussions with consultants, evaluation of patient's response to treatment, examination of patient, obtaining history from patient or surrogate, ordering and performing treatments and interventions, ordering and review of laboratory studies, ordering and review of radiographic studies, pulse oximetry and re-evaluation of patient's condition.   Procedures   MEDICATIONS ORDERED IN ED: Medications  doxycycline (VIBRA-TABS) tablet 100 mg (has no administration in time range)  methylPREDNISolone  sodium succinate (SOLU-MEDROL ) 40 mg/mL injection 40 mg (has no administration in time range)    Followed by  predniSONE  (DELTASONE ) tablet 40 mg (has no administration in time range)  ipratropium-albuterol  (DUONEB) 0.5-2.5 (3) MG/3ML nebulizer solution 3 mL (has no administration in time range)  enoxaparin  (LOVENOX ) injection 40 mg (has no administration in time range)  acetaminophen  (TYLENOL ) tablet 650 mg (has no administration in time range)    Or  acetaminophen  (TYLENOL ) suppository 650 mg (has no administration in time range)  ipratropium-albuterol  (DUONEB) 0.5-2.5 (3) MG/3ML nebulizer solution 3 mL (3 mLs Nebulization Given 06/08/23 1227)  ipratropium-albuterol  (DUONEB) 0.5-2.5 (3) MG/3ML nebulizer solution 3 mL (3 mLs Nebulization Given 06/08/23 1227)  amLODipine  (NORVASC ) tablet 10 mg (10 mg Oral Given 06/08/23 1227)  ipratropium-albuterol  (DUONEB) 0.5-2.5 (3) MG/3ML nebulizer solution 3 mL (3 mLs Nebulization Given 06/08/23 1317)  albuterol  (PROVENTIL ) (2.5 MG/3ML) 0.083% nebulizer solution 2.5 mg (2.5 mg Nebulization Given 06/08/23 1317)  predniSONE  (DELTASONE ) tablet 60 mg (60 mg Oral Given 06/08/23 1439)  albuterol  (PROVENTIL ) (2.5 MG/3ML) 0.083% nebulizer solution 5 mg (5 mg Nebulization Given 06/08/23 1440)     IMPRESSION / MDM / ASSESSMENT AND PLAN / ED COURSE  I reviewed the triage vital signs and the nursing notes.                              Differential  diagnosis includes, but is not limited to, COPD exacerbation, pneumonia, asthma, no chest pain no clinical signs or symptoms of be highly suggestive of ACS.  No leg swelling no history of blood clots.  Significant wheezing in all lung fields and symptomatology that is most consistent with COPD exacerbation.  No obvious evidence of infectious cause.  No noted history of thromboembolic disease no clinical findings would be highly suggestive of thromboembolism, etc  Patient's presentation is most consistent with acute presentation with potential threat to life or bodily function.   The patient is on the cardiac monitor to evaluate for evidence of arrhythmia and/or significant heart rate changes.  Clinical Course as of 06/08/23 1449  Mon Jun 08, 2023  1258 Patient assessed.  Ongoing hypertension.  Continue to monitor.  Still moderate expiratory wheezing.  Reports he feels a little bit better but still showing accessory muscle use, speaking in phrases.  No hypoxia.  No chest pain.  [MQ]  1406 Patient can alert.  Lung sounds clear.  No distress breathing noted at this time.  Will trial ambulation if patient continues to do well anticipate likely discharge. [MQ]    Clinical Course User Index [MQ] Iver Marker, MD   ----------------------------------------- 2:38 PM on 06/08/2023 ----------------------------------------- Attempted ambulation patient able to ambulate back-and-forth in the room to the bathroom but when returning to the bed is tachypneic reports ongoing feeling of shortness of breath.  He continues to have coarse lung sounds with mild expiratory wheezing throughout.  Given degree of symptomatology, multiple nebulizer treatments though he is showing signs of improvement but ongoing symptoms and tachypnea will admit to the hospitalist for further care and treatment.  Patient agreeable with this plan  Consulted with patient accepted by the hospitalist Dr. Laural Polka  FINAL CLINICAL  IMPRESSION(S) / ED DIAGNOSES   Final diagnoses:  COPD exacerbation (HCC)     Rx / DC Orders   ED Discharge Orders     None        Note:  This document was prepared using Dragon voice recognition software and may include unintentional dictation errors.   Iver Marker, MD 06/08/23 [phone]

## 2023-06-08 NOTE — ED Notes (Signed)
 Pt given box lunch.

## 2023-06-08 NOTE — H&P (Signed)
 History and Physical    Patient: Parker Brown JYN:[id number] DOB: [birth year] DOA: 06/08/2023 DOS: the patient was seen and examined on 06/08/2023 PCP: Pcp, No  Patient coming from: Home  Chief Complaint:  Chief Complaint  Patient presents with   Shortness of Breath   HPI: Parker Brown is a 68 y.o. male with medical history significant of COPD hepatitis C, polysubstance abuse presented to the ED today via EMS for evaluation of shortness of breath, wheezing and chest tightness.  Pt reports onset last night, but worsening progressively since this morning.  Endorses associated cough.  Otherwise denies recent illnesses. Denies fever/chills, sore throat, congestion, abdominal pain, nausea, vomiting, diarrhea, dysuria.  Reports recent cocaine use. EMS reported gave 125 mg Solu-medrol  en route to the ED.  In the ED - initial vitals HR 65, temp 98.2 F, RR variable from 16 -- 30 breaths/minute, BP elevated 157/107, and spO2 98% on room air.  Labs obtained including BMP, CBC, BNP and troponin x 2 -- these were normal except for Cr 1.28, Ca 8.3, Hbg 12.4. Viral PCR swab negative for Covid-19, Flu A/B and RSV.   Chest x-ray was negative for acute findings. Patient was treated in the ED with nebulized albuterol  x 2, Duoneb x 3, prednisone  60 mg, 10 mg amlodipine .  Patient is admitted to med/surg floor for observation and further management of COPD with acute exacerbation and close monitoring.      Review of Systems: As mentioned in the history of present illness. All other systems reviewed and are negative.   Past Medical History:  Diagnosis Date   Arthritis    COPD (chronic obstructive pulmonary disease) (HCC)    Hyperlipidemia    Hypertension    Past Surgical History:  Procedure Laterality Date   NO PAST SURGERIES     Social History:  reports that he has been smoking cigarettes. He has never used smokeless tobacco. He reports current alcohol use. He reports current  drug use. Drugs: Marijuana and "Crack" cocaine.  Allergies  Allergen Reactions   Benzoyl Peroxide Anaphylaxis   Fish Oil Anaphylaxis   Methocarbamol Anaphylaxis   Pineapple Anaphylaxis   Carbomer    Tuberculin, Ppd     Other Reaction(s): Eruption of skin   Tuberculin Purified Protein Derivative Hives and Rash    History reviewed. No pertinent family history.  Prior to Admission medications   Medication Sig Start Date End Date Taking? Authorizing Provider  acetaminophen  (TYLENOL ) 500 MG tablet Take 1,000 mg by mouth every 6 (six) hours as needed for mild pain. 07/16/21   [provider]  amLODipine  (NORVASC ) 10 MG tablet Take 10 mg by mouth daily.    [provider]  DULoxetine  (CYMBALTA ) 30 MG capsule Take 30 mg by mouth every 12 (twelve) hours.    [provider]  fluticasone-salmeterol (ADVAIR) 250-50 MCG/ACT AEPB Inhale 1 puff into the lungs in the morning and at bedtime.    [provider]  gabapentin  (NEURONTIN ) 300 MG capsule Take 300 mg by mouth 3 (three) times daily.    [provider]  guaiFENesin  (MUCINEX ) 600 MG 12 hr tablet Take 2 tablets (1,200 mg total) by mouth 2 (two) times daily. 10/20/22 10/20/23  Ezzard Holms, MD  levalbuterol  (XOPENEX  HFA) 45 MCG/ACT inhaler Inhale 2 puffs into the lungs every 6 (six) hours as needed for wheezing. 10/20/22   Ezzard Holms, MD  polyethylene glycol (MIRALAX  / GLYCOLAX ) 17 g packet Take 17 g by mouth daily as  needed for mild constipation. 10/20/22   Ezzard Holms, MD  traZODone  (DESYREL ) 100 MG tablet Take 1 tablet (100 mg total) by mouth at bedtime. 10/20/22   Ezzard Holms, MD    Physical Exam: Vitals:   06/08/23 1145 06/08/23 1227 06/08/23 1300 06/08/23 1430  BP:  (!) 164/106 (!) 168/109 (!) 145/85  Pulse:   79 73  Resp: 16  (!) 29 17  Temp:      TempSrc:      SpO2:   100% 95%  Weight:      Height:       General exam: awake, alert, no acute distress HEENT: atraumatic, clear  conjunctiva, anicteric sclera, moist mucus membranes, hearing grossly normal  Respiratory system: decreased breath sounds with poor aeration, diffuse expiratory wheezes, mildly increased respiratory effort, on room air Cardiovascular system: normal S1/S2, RRR, no pedal edema.   Gastrointestinal system: soft, NT, ND, no HSM felt, +bowel sounds. Central nervous system: A&O x3. no gross focal neurologic deficits, normal speech Extremities: moves all, no edema, normal tone Skin: dry, intact, normal temperature, normal color, No rashes, lesions or ulcers Psychiatry: normal mood, congruent affect    Data Reviewed:  As reviewed above  Assessment and Plan:  COPD with acute exacerbation Not hypoxic on admission.  Negative for Covid, Flu, RSV. Chest xray negative. --IV Solu-medrol  40 mg Q12H >> transition to prednisone  x 4 more days --Scheduled Duonebs Q6H --Albuterol  nebs Q3H PRN --PO Doxycycline x 5 days --Mucinex    Hypertension -- resume amlodipine  10 mg (pt reportedly not taking)  Medication non-compliance -- noted and pt counseled on importance of medications prescribed.  Polysubstance abuse -- pt endorses recent cocaine use, may contribute to presenting clinical picture.  Counseled pt on cessation of illicit substances.    Advance Care Planning: Code status - full code  Consults: None  Family Communication: None. Pt able to update.  Severity of Illness: The appropriate patient status for this patient is OBSERVATION. Observation status is judged to be reasonable and necessary in order to provide the required intensity of service to ensure the patient's safety. The patient's presenting symptoms, physical exam findings, and initial radiographic and laboratory data in the context of their medical condition is felt to place them at decreased risk for further clinical deterioration. Furthermore, it is anticipated that the patient will be medically stable for discharge from the hospital  within 2 midnights of admission.   Author: Montey Apa, DO 06/08/2023 2:44 PM  For on call review [email or web address].

## 2023-06-08 NOTE — Plan of Care (Signed)

## 2023-06-08 NOTE — ED Triage Notes (Signed)
 Patient reports trouble breathing that started this AM. Arrived by [facility] from home. Per EMS patients lungs sound tight and was tripoding on scene.  A&O x4  EMS administered 125 solumedrol  Patient reports drug use and has history of behavioral issues

## 2023-06-09 DIAGNOSIS — E785 Hyperlipidemia, unspecified: Secondary | ICD-10-CM | POA: Diagnosis present

## 2023-06-09 DIAGNOSIS — F1490 Cocaine use, unspecified, uncomplicated: Secondary | ICD-10-CM | POA: Diagnosis present

## 2023-06-09 DIAGNOSIS — F17210 Nicotine dependence, cigarettes, uncomplicated: Secondary | ICD-10-CM | POA: Diagnosis present

## 2023-06-09 DIAGNOSIS — Z1152 Encounter for screening for COVID-19: Secondary | ICD-10-CM | POA: Diagnosis not present

## 2023-06-09 DIAGNOSIS — J441 Chronic obstructive pulmonary disease with (acute) exacerbation: Secondary | ICD-10-CM | POA: Diagnosis present

## 2023-06-09 DIAGNOSIS — Z888 Allergy status to other drugs, medicaments and biological substances status: Secondary | ICD-10-CM | POA: Diagnosis not present

## 2023-06-09 DIAGNOSIS — M1990 Unspecified osteoarthritis, unspecified site: Secondary | ICD-10-CM | POA: Diagnosis present

## 2023-06-09 DIAGNOSIS — Z91148 Patient's other noncompliance with medication regimen for other reason: Secondary | ICD-10-CM | POA: Diagnosis not present

## 2023-06-09 DIAGNOSIS — Z91018 Allergy to other foods: Secondary | ICD-10-CM | POA: Diagnosis not present

## 2023-06-09 DIAGNOSIS — E876 Hypokalemia: Secondary | ICD-10-CM | POA: Diagnosis present

## 2023-06-09 DIAGNOSIS — Z7951 Long term (current) use of inhaled steroids: Secondary | ICD-10-CM | POA: Diagnosis not present

## 2023-06-09 DIAGNOSIS — I10 Essential (primary) hypertension: Secondary | ICD-10-CM | POA: Diagnosis present

## 2023-06-09 DIAGNOSIS — Z79899 Other long term (current) drug therapy: Secondary | ICD-10-CM | POA: Diagnosis not present

## 2023-06-09 DIAGNOSIS — Z91013 Allergy to seafood: Secondary | ICD-10-CM | POA: Diagnosis not present

## 2023-06-09 DIAGNOSIS — I160 Hypertensive urgency: Secondary | ICD-10-CM | POA: Diagnosis present

## 2023-06-09 DIAGNOSIS — Z883 Allergy status to other anti-infective agents status: Secondary | ICD-10-CM | POA: Diagnosis not present

## 2023-06-09 DIAGNOSIS — E538 Deficiency of other specified B group vitamins: Secondary | ICD-10-CM | POA: Diagnosis present

## 2023-06-09 DIAGNOSIS — Z8619 Personal history of other infectious and parasitic diseases: Secondary | ICD-10-CM | POA: Diagnosis not present

## 2023-06-09 DIAGNOSIS — Z91048 Other nonmedicinal substance allergy status: Secondary | ICD-10-CM | POA: Diagnosis not present

## 2023-06-09 DIAGNOSIS — R0602 Shortness of breath: Secondary | ICD-10-CM | POA: Diagnosis present

## 2023-06-09 MED ORDER — HYDROCOD POLI-CHLORPHE POLI ER 10-8 MG/5ML PO SUER
5.0000 mL | Freq: Two times a day (BID) | ORAL | Status: DC | PRN
  Administered 2023-06-09: 5 mL via ORAL
  Filled 2023-06-09: qty 5

## 2023-06-09 NOTE — Progress Notes (Signed)
 Patient o2 saturation remained at 95% with ambulation around unit x2.  Pulse of 101 with ambulation

## 2023-06-09 NOTE — TOC Initial Note (Signed)
 Transition of Care [facility]) - Initial/Assessment Note    Patient Details  Name: Parker Brown MRN: [id number] Date of Birth: 11/19/1955  Transition of Care [facility] CM/SW Contact:    Elsie Halo, RN Phone Number: 06/09/2023, 12:19 PM  Clinical Narrative:                 TOC spoke with the patient at the bedside. The patient lives at home, he and his sister share a house. He currently drives and doesn't have any difficulty getting to and from appts or obtaining medications. His PCP is with [facility]. He uses the [place] pharmacy. He has a cane that he uses ocassionally.  No TOC needs. Please outreach to [facility] if needs are identified.     Barriers to Discharge: Continued Medical Work up   Patient Goals and CMS Choice            Expected Discharge Plan and Services   Discharge Planning Services: CM Consult                                          Prior Living Arrangements/Services                       Activities of Daily Living   ADL Screening (condition at time of admission) Independently performs ADLs?: Yes (appropriate for developmental age) Is the patient deaf or have difficulty hearing?: No Does the patient have difficulty seeing, even when wearing glasses/contacts?: No Does the patient have difficulty concentrating, remembering, or making decisions?: No  Permission Sought/Granted                  Emotional Assessment       Orientation: : Oriented to Place, Oriented to Self, Oriented to  Time, Oriented to Situation   Psych Involvement: No (comment)  Admission diagnosis:  COPD exacerbation (HCC) [J44.1] COPD with acute exacerbation (HCC) [J44.1] Patient Active Problem List   Diagnosis Date Noted   Posttraumatic stress disorder 10/19/2022   Osteoarthritis 10/19/2022   Chronic low back pain 10/19/2022   Diarrhea 10/19/2022   Diverticulosis of colon 10/19/2022   Chronic hepatitis C (HCC) 10/19/2022   History of tobacco  use 03/15/2022   Polysubstance abuse (HCC) 03/15/2022   Insomnia 03/15/2022   Musculoskeletal chest pain 03/15/2022   COPD exacerbation (HCC) 01/10/2018   COPD with acute exacerbation (HCC) 06/09/2017   HTN (hypertension) 06/09/2017   HLD (hyperlipidemia) 06/09/2017   Carpal tunnel syndrome 01/07/1999   PCP:  Pcp, No Pharmacy:   [facility] [place], [place] - [address] [place] [address] Phone: [phone] Fax: [phone]  [facility] [place], [place] - [address] [place] [address] Phone: [phone] Fax: [phone]  [facility] [address], [place] - [address] [place] [address] Phone: [phone] Fax: [phone]     Social Drivers of Health (SDOH) Social History: SDOH Screenings   Food Insecurity: No Food Insecurity (06/08/2023)  Housing: Low Risk  (06/08/2023)  Transportation Needs: No Transportation Needs (06/08/2023)  Utilities: Not At Risk (06/08/2023)  Social Connections: Socially Isolated (06/08/2023)  Tobacco Use: High Risk (06/08/2023)   SDOH Interventions:     Readmission Risk Interventions     No data to display

## 2023-06-09 NOTE — Plan of Care (Signed)
 Tamea Faith, LPN

## 2023-06-09 NOTE — Plan of Care (Signed)

## 2023-06-09 NOTE — Progress Notes (Addendum)
  Progress Note   Patient: Parker Brown WJX:[id number] DOB: [birth year] DOA: 06/08/2023     0 DOS: the patient was seen and examined on 06/09/2023   Brief hospital course:  "Parker Brown is a 68 y.o. male with medical history significant of COPD hepatitis C, polysubstance abuse presented to the ED today via EMS for evaluation of shortness of breath, wheezing and chest tightness.  Pt reports onset last night, but worsening progressively since this morning..." See H&P for full HPI on admission & ED course.  Patient was admitted for further management of COPD with acute exacerbation as outlined in detail below.  6/3 -- pt still wheezing and dyspneic, needs improvement prior to d/c  Assessment and Plan:  COPD with acute exacerbation Not hypoxic on admission.  Negative for Covid, Flu, RSV. Chest xray negative.  Given 125 mg IV Solu-medrol  with EMS, several neb treatments while in the ED, had persistent increased work of breathing and wheezing.  Not hypoxic. --IV Solu-medrol  40 mg Q12H x 2 doses --Transition to prednisone  x 4 more days --Scheduled Duonebs Q6H --Albuterol  nebs Q3H PRN --PO Doxycycline x 5 days --Mucinex      Hypertension -- resume amlodipine  10 mg (pt reportedly not taking)   Medication non-compliance -- noted and pt counseled on importance of medications prescribed.   Polysubstance abuse -- pt endorses recent cocaine use, may contribute to presenting clinical picture.  Counseled pt on cessation of illicit substances.      Subjective: Pt awake resting in bed this AM.  Reports still quite short of breath, worse with any exertion, not feeling back to himself yet. Reports coughing. No fever/chills.  Physical Exam: Vitals:   06/09/23 0431 06/09/23 0725 06/09/23 0742 06/09/23 0906  BP: (!) 154/97  (!) 154/95   Pulse: (!) 57  62 (!) 101  Resp: (!) 24  19   Temp: 98.4 F (36.9 C)  98 F (36.7 C)   TempSrc: Oral  Oral   SpO2: 100% 99% 97% 95%   Weight:      Height:       General exam: awake, alert, no acute distress HEENT: moist mucus membranes, hearing grossly normal  Respiratory system: severely diminished aeration, diffuse expiratory wheezes, mildly increased respiratory effort with some accessory muscle use at rest Cardiovascular system: normal S1/S2, RRR, no JVD, murmurs, rubs, gallops,  no pedal edema.   Gastrointestinal system: soft, NT, ND, no HSM felt, +bowel sounds. Central nervous system: A&O x3. no gross focal neurologic deficits, normal speech Extremities: moves all , no edema, normal tone Skin: dry, intact, normal temperature, normal color, No rashes, lesions or ulcers Psychiatry: normal mood, congruent affect, judgement and insight appear normal   Data Reviewed:  No new labs today    Family Communication: None. Pt updated in detail.  Disposition: Status is: Inpatient Remains admitted needing further clinical improvement prior to d/c to prevent clinical worsening and readmission    Planned Discharge Destination: Home    Time spent: 42 minutes  Author: Montey Apa, DO 06/09/2023 9:43 AM  For on call review [email or web address].

## 2023-06-10 DIAGNOSIS — J441 Chronic obstructive pulmonary disease with (acute) exacerbation: Secondary | ICD-10-CM | POA: Diagnosis not present

## 2023-06-10 LAB — BASIC METABOLIC PANEL WITH GFR
Anion gap: 6 (ref 5–15)
BUN: 23 mg/dL (ref 8–23)
CO2: 25 mmol/L (ref 22–32)
Calcium: 9.3 mg/dL (ref 8.9–10.3)
Chloride: 109 mmol/L (ref 98–111)
Creatinine, Ser: 1.01 mg/dL (ref 0.61–1.24)
GFR, Estimated: >60 mL/min (ref >60)
Glucose, Bld: 98 mg/dL (ref 70–99)
Potassium: 3.6 mmol/L (ref 3.5–5.1)
Sodium: 140 mmol/L (ref 135–145)

## 2023-06-10 LAB — CBC
HCT: 39.9 % (ref 39.0–52.0)
Hemoglobin: 13.3 g/dL (ref 13.0–17.0)
MCH: 29.8 pg (ref 26.0–34.0)
MCHC: 33.3 g/dL (ref 30.0–36.0)
MCV: 89.3 fL (ref 80.0–100.0)
Platelets: 254 10*3/uL (ref 150–400)
RBC: 4.47 MIL/uL (ref 4.22–5.81)
RDW: 14.4 % (ref 11.5–15.5)
WBC: 9.5 10*3/uL (ref 4.0–10.5)
nRBC: 0 % (ref 0.0–0.2)

## 2023-06-10 LAB — MAGNESIUM: Magnesium: 2 mg/dL (ref 1.7–2.4)

## 2023-06-10 LAB — PHOSPHORUS: Phosphorus: 2.2 mg/dL — ABNORMAL LOW (ref 2.5–4.6)

## 2023-06-10 LAB — IRON AND TIBC
Iron: 63 ug/dL (ref 45–182)
Saturation Ratios: 20 % (ref 17.9–39.5)
TIBC: 319 ug/dL (ref 250–450)
UIBC: 256 ug/dL

## 2023-06-10 LAB — FOLATE: Folate: 12.6 ng/mL (ref >5.9)

## 2023-06-10 LAB — VITAMIN B12: Vitamin B-12: 152 pg/mL — ABNORMAL LOW (ref 180–914)

## 2023-06-10 LAB — VITAMIN D 25 HYDROXY (VIT D DEFICIENCY, FRACTURES): Vit D, 25-Hydroxy: 18.26 ng/mL — ABNORMAL LOW (ref 30–100)

## 2023-06-10 MED ORDER — GUAIFENESIN ER 600 MG PO TB12
600.0000 mg | ORAL_TABLET | Freq: Two times a day (BID) | ORAL | Status: DC
  Administered 2023-06-10 – 2023-06-12 (×5): 600 mg via ORAL
  Filled 2023-06-10 (×5): qty 1

## 2023-06-10 MED ORDER — FLUTICASONE FUROATE-VILANTEROL 200-25 MCG/ACT IN AEPB
1.0000 | INHALATION_SPRAY | Freq: Every day | RESPIRATORY_TRACT | Status: DC
  Administered 2023-06-10 – 2023-06-12 (×3): 1 via RESPIRATORY_TRACT
  Filled 2023-06-10: qty 28

## 2023-06-10 MED ORDER — IPRATROPIUM-ALBUTEROL 0.5-2.5 (3) MG/3ML IN SOLN
3.0000 mL | Freq: Four times a day (QID) | RESPIRATORY_TRACT | Status: DC | PRN
  Administered 2023-06-10 – 2023-06-11 (×2): 3 mL via RESPIRATORY_TRACT
  Filled 2023-06-10 (×2): qty 3

## 2023-06-10 MED ORDER — HYDRALAZINE HCL 20 MG/ML IJ SOLN
10.0000 mg | Freq: Four times a day (QID) | INTRAMUSCULAR | Status: DC | PRN
  Administered 2023-06-10 – 2023-06-11 (×3): 10 mg via INTRAVENOUS
  Filled 2023-06-10 (×3): qty 1

## 2023-06-10 NOTE — Plan of Care (Signed)

## 2023-06-10 NOTE — Plan of Care (Signed)
 Tamea Faith, LPN

## 2023-06-10 NOTE — Progress Notes (Signed)
 Patient noted to be lying in bed.  Eyes closed.  Opens spontaneously, when name called. Patient has been assessed throughout current shift.  Patient noted to have Had elevated BP, with most recent reading is as follows:  188/110, pulse-60, MAP - 131.  Patient has verbally denied any blurry vision, HA, N/V, etc.  Information sent to MD on call.  Awaiting any additional interventions per MD.  Patient to continue to be monitored until discharged.

## 2023-06-10 NOTE — Progress Notes (Signed)
  Progress Note   Patient: Parker Brown AVW:[id number] DOB: 1955/03/30 DOA: 06/08/2023     1 DOS: the patient was seen and examined on 06/10/2023   Brief hospital course:  "Parker Brown is a 68 y.o. male with medical history significant of COPD hepatitis C, polysubstance abuse presented to the ED today via EMS for evaluation of shortness of breath, wheezing and chest tightness.  Pt reports onset last night, but worsening progressively since this morning..." See H&P for full HPI on admission & ED course.  Patient was admitted for further management of COPD with acute exacerbation as outlined in detail below.  6/3 -- pt still wheezing and dyspneic, needs improvement prior to d/c 6/4 uncontrolled BP, received IV hydralazine in the morning, we will continue to monitor BP today and plan for discharge tomorrow a.m.   Assessment and Plan:  COPD with acute exacerbation Not hypoxic on admission.  Negative for Covid, Flu, RSV. Chest xray negative.  Given 125 mg IV Solu-medrol  with EMS, several neb treatments while in the ED, had persistent increased work of breathing and wheezing.  Not hypoxic. --IV Solu-medrol  40 mg Q12H x 2 doses --Transition to prednisone  x 4 more days Started Breo Ellipta inhaler --Scheduled Duonebs Q6H --Albuterol  nebs Q3H PRN --PO Doxycycline x 5 days --Mucinex  600 mg po bid     Hypertension -- resume amlodipine  10 mg (pt reportedly not taking) Use IV hydralazine as needed  Medication non-compliance -- noted and pt counseled on importance of medications prescribed.   Polysubstance abuse -- pt endorses recent cocaine use, may contribute to presenting clinical picture.  Counseled pt on cessation of illicit substances.      Subjective: No significant overnight events, patient feels improvement in the shortness of breath, still has mild wheezing, denied any chest pain or palpitation, no any other complaints   Physical Exam: Vitals:   06/10/23  0654 06/10/23 0833 06/10/23 1140 06/10/23 1145  BP: (!) 174/100 (!) 162/97 (!) 167/98 (!) 161/98  Pulse: 65 78 81 79  Resp:  20 20 20   Temp:  98 F (36.7 C)    TempSrc:      SpO2:  94%    Weight:      Height:       General exam: awake, alert, no acute distress HEENT: moist mucus membranes, hearing grossly normal  Respiratory system: Equal air entry bilaterally, mild wheezes bilaterally, no crackles.   Cardiovascular system: normal S1/S2, RRR, no JVD, murmurs, rubs, gallops,  no pedal edema.   Gastrointestinal system: soft, NT, ND, no HSM felt, +bowel sounds. Central nervous system: A&O x3. no gross focal neurologic deficits, normal speech Extremities: moves all , no edema, normal tone Skin: dry, intact, normal temperature, normal color, No rashes, lesions or ulcers Psychiatry: normal mood, congruent affect, judgement and insight appear normal   Data Reviewed:  Labs reviewed  Family Communication: None. Pt updated in detail.  Disposition: Status is: Inpatient Remains admitted needing further clinical improvement prior to d/c to prevent clinical worsening and readmission DC plan tomorrow a.m. continue to monitor today due to elevated BP   Planned Discharge Destination: Home    Time spent: 42 minutes  Author: Althia Atlas, MD 06/10/2023 2:38 PM  For on call review [email or web address].

## 2023-06-10 NOTE — Progress Notes (Signed)
   06/10/23 0406 06/10/23 0654 06/10/23 0833  Vitals  BP (!) 188/110 (!) 174/100 (!) 162/97  MAP (mmHg) 131 122 117  BP Location Right Arm Right Arm Right Arm  BP Method Automatic Automatic Automatic  Patient Position (if appropriate) Lying Sitting Lying  Pulse Rate 60 65 78  Pulse Rate Source  --   --  Monitor  Resp  --   --  20    06/10/23 1140 06/10/23 1145  Vitals  BP (!) 167/98 (!) 161/98  MAP (mmHg) 98 117  BP Location Right Arm Right Arm  BP Method Automatic Automatic  Patient Position (if appropriate) Lying Lying  Pulse Rate 81 79  Pulse Rate Source Monitor Monitor  Resp 20 20

## 2023-06-11 DIAGNOSIS — J441 Chronic obstructive pulmonary disease with (acute) exacerbation: Secondary | ICD-10-CM | POA: Diagnosis not present

## 2023-06-11 LAB — CBC
HCT: 38.8 % — ABNORMAL LOW (ref 39.0–52.0)
Hemoglobin: 13.2 g/dL (ref 13.0–17.0)
MCH: 30.2 pg (ref 26.0–34.0)
MCHC: 34 g/dL (ref 30.0–36.0)
MCV: 88.8 fL (ref 80.0–100.0)
Platelets: 260 10*3/uL (ref 150–400)
RBC: 4.37 MIL/uL (ref 4.22–5.81)
RDW: 14.6 % (ref 11.5–15.5)
WBC: 9.7 10*3/uL (ref 4.0–10.5)
nRBC: 0 % (ref 0.0–0.2)

## 2023-06-11 LAB — BASIC METABOLIC PANEL WITH GFR
Anion gap: 9 (ref 5–15)
BUN: 19 mg/dL (ref 8–23)
CO2: 22 mmol/L (ref 22–32)
Calcium: 9.1 mg/dL (ref 8.9–10.3)
Chloride: 105 mmol/L (ref 98–111)
Creatinine, Ser: 0.99 mg/dL (ref 0.61–1.24)
GFR, Estimated: >60 mL/min (ref >60)
Glucose, Bld: 129 mg/dL — ABNORMAL HIGH (ref 70–99)
Potassium: 3.1 mmol/L — ABNORMAL LOW (ref 3.5–5.1)
Sodium: 136 mmol/L (ref 135–145)

## 2023-06-11 LAB — MAGNESIUM: Magnesium: 1.9 mg/dL (ref 1.7–2.4)

## 2023-06-11 LAB — PHOSPHORUS: Phosphorus: 3.5 mg/dL (ref 2.5–4.6)

## 2023-06-11 MED ORDER — VITAMIN B-12 1000 MCG PO TABS: 1000.0000 ug | ORAL_TABLET | Freq: Every day | ORAL | Status: DC

## 2023-06-11 MED ORDER — POTASSIUM CHLORIDE CRYS ER 20 MEQ PO TBCR
40.0000 meq | EXTENDED_RELEASE_TABLET | ORAL | Status: AC
  Administered 2023-06-11 (×2): 40 meq via ORAL
  Filled 2023-06-11 (×2): qty 2

## 2023-06-11 MED ORDER — METOPROLOL TARTRATE 50 MG PO TABS
50.0000 mg | ORAL_TABLET | Freq: Two times a day (BID) | ORAL | Status: DC
  Administered 2023-06-11 (×2): 50 mg via ORAL
  Filled 2023-06-11 (×2): qty 1

## 2023-06-11 MED ORDER — CYANOCOBALAMIN 1000 MCG/ML IJ SOLN
1000.0000 ug | Freq: Every day | INTRAMUSCULAR | Status: AC
  Administered 2023-06-11 – 2023-06-12 (×2): 1000 ug via INTRAMUSCULAR
  Filled 2023-06-11 (×2): qty 1

## 2023-06-11 MED ORDER — VITAMIN D (ERGOCALCIFEROL) 1.25 MG (50000 UNIT) PO CAPS
50000.0000 [IU] | ORAL_CAPSULE | ORAL | Status: DC
  Administered 2023-06-11: 50000 [IU] via ORAL
  Filled 2023-06-11: qty 1

## 2023-06-11 NOTE — Progress Notes (Signed)
 Progress Note   Patient: Parker Brown ZOX:[id number] DOB: Jul 17, 1955 DOA: 06/08/2023     2 DOS: the patient was seen and examined on 06/11/2023   Brief hospital course:  "Parker Brown is a 68 y.o. male with medical history significant of COPD hepatitis C, polysubstance abuse presented to the ED today via EMS for evaluation of shortness of breath, wheezing and chest tightness.  Pt reports onset last night, but worsening progressively since this morning..." See H&P for full HPI on admission & ED course.  Patient was admitted for further management of COPD with acute exacerbation as outlined in detail below.  6/3 -- pt still wheezing and dyspneic, needs improvement prior to d/c 6/4 uncontrolled BP, received IV hydralazine in the morning, we will continue to monitor BP today and plan for discharge tomorrow a.m.   Assessment and Plan:  COPD with acute exacerbation Not hypoxic on admission.  Negative for Covid, Flu, RSV. Chest xray negative.  Given 125 mg IV Solu-medrol  with EMS, several neb treatments while in the ED, had persistent increased work of breathing and wheezing.  Not hypoxic. --IV Solu-medrol  40 mg Q12H x 2 doses --Transition to prednisone  x 4 more days Started Breo Ellipta inhaler --Scheduled Duonebs Q6H --Albuterol  nebs Q3H PRN --PO Doxycycline x 5 days --Mucinex  600 mg po bid     Hypertension -- resume amlodipine  10 mg (pt reportedly not taking) 6/5 started Lopressor 50 mg p.o. twice daily due to persistent high blood pressure Use IV hydralazine as needed Monitor BP and titrate medications accordingly  Hypokalemia, unknown cause, patient denies diarrhea Potassium repleted. Repeat BMP daily  Hypophosphatemia, resolved.  Medication non-compliance -- noted and pt counseled on importance of medications prescribed.   Polysubstance abuse -- pt endorses recent cocaine use, may contribute to presenting clinical picture.  Counseled pt on cessation of  illicit substances.  Vitamin B12 deficiency: Started vitamin B12 1000 mcg IM injection daily during hospital stay, followed by oral supplement.  Follow-up PCP to repeat vitamin B12 level after 3 to 6 months.  Vitamin D deficiency: started vitamin D 50,000 units p.o. weekly, follow with PCP to repeat vitamin D level after 3 to 6 months.   Subjective: No significant overnight events, shortness of breath is improving, patient still has mild cough.  Potassium is low, patient denies any diarrhea. Blood pressure is still high, medications adjusted.  Patient denies having high blood pressure at home, stated that he has a blood pressure cuff and blood pressure runs normal.  Patient stated that his blood pressure always runs high when he is in the hospital.   Physical Exam: Vitals:   06/10/23 2004 06/11/23 0423 06/11/23 0618 06/11/23 0816  BP: (!) 151/94 (!) 160/106 (!) 158/100 (!) 160/107  Pulse: 100 79 81 (!) 101  Resp: 18   15  Temp: 98 F (36.7 C) 98 F (36.7 C)  97.8 F (36.6 C)  TempSrc:      SpO2: 97% 95%  91%  Weight:      Height:       General exam: awake, alert, no acute distress HEENT: moist mucus membranes, hearing grossly normal  Respiratory system: Equal air entry bilaterally, mild wheezes bilaterally, no crackles.   Cardiovascular system: normal S1/S2, RRR, no JVD, murmurs, rubs, gallops,  no pedal edema.   Gastrointestinal system: soft, NT, ND, no HSM felt, +bowel sounds. Central nervous system: A&O x3. no gross focal neurologic deficits, normal speech Extremities: moves all , no edema, normal tone Skin: dry,  intact, normal temperature, normal color, No rashes, lesions or ulcers Psychiatry: normal mood, congruent affect, judgement and insight appear normal   Data Reviewed:  Labs reviewed  Family Communication: None. Pt updated in detail.  Disposition: Status is: Inpatient Remains admitted needing further clinical improvement prior to d/c to prevent clinical  worsening and readmission DC plan tomorrow a.m. due to significant hyperkalemia and uncontrolled blood pressure     Planned Discharge Destination: Home    Time spent: 55 minutes  Author: Althia Atlas, MD 06/11/2023 12:07 PM  For on call review [email or web address].

## 2023-06-11 NOTE — TOC Progression Note (Signed)
 Transition of Care [facility]) - Progression Note    Patient Details  Name: Parker Brown MRN: [id number] Date of Birth: 16-Sep-1955  [facility] CM/SW Contact  Elsie Halo, RN Phone Number: 06/11/2023, 12:56 PM  Clinical Narrative:      Patient remains inpatient with uncontrolled BP; received IV hydralazine today. Plan to DC to home when medically clear.    No TOC needs identified.  Barriers to Discharge: Continued Medical Work up  Expected Discharge Plan and Services   Discharge Planning Services: CM Consult                                           Social Determinants of Health (SDOH) Interventions SDOH Screenings   Food Insecurity: No Food Insecurity (06/08/2023)  Housing: Low Risk  (06/08/2023)  Transportation Needs: No Transportation Needs (06/08/2023)  Utilities: Not At Risk (06/08/2023)  Social Connections: Socially Isolated (06/08/2023)  Tobacco Use: High Risk (06/08/2023)    Readmission Risk Interventions     No data to display

## 2023-06-11 NOTE — Plan of Care (Signed)
 Parker Brown

## 2023-06-12 ENCOUNTER — Other Ambulatory Visit: Payer: Self-pay

## 2023-06-12 DIAGNOSIS — J441 Chronic obstructive pulmonary disease with (acute) exacerbation: Secondary | ICD-10-CM | POA: Diagnosis not present

## 2023-06-12 LAB — CBC
HCT: 44.7 % (ref 39.0–52.0)
Hemoglobin: 14.5 g/dL (ref 13.0–17.0)
MCH: 29.4 pg (ref 26.0–34.0)
MCHC: 32.4 g/dL (ref 30.0–36.0)
MCV: 90.5 fL (ref 80.0–100.0)
Platelets: 274 10*3/uL (ref 150–400)
RBC: 4.94 MIL/uL (ref 4.22–5.81)
RDW: 14.5 % (ref 11.5–15.5)
WBC: 10.1 10*3/uL (ref 4.0–10.5)
nRBC: 0 % (ref 0.0–0.2)

## 2023-06-12 LAB — BASIC METABOLIC PANEL WITH GFR
Anion gap: 7 (ref 5–15)
BUN: 22 mg/dL (ref 8–23)
CO2: 23 mmol/L (ref 22–32)
Calcium: 9.4 mg/dL (ref 8.9–10.3)
Chloride: 108 mmol/L (ref 98–111)
Creatinine, Ser: 1.06 mg/dL (ref 0.61–1.24)
GFR, Estimated: >60 mL/min (ref >60)
Glucose, Bld: 99 mg/dL (ref 70–99)
Potassium: 4.1 mmol/L (ref 3.5–5.1)
Sodium: 138 mmol/L (ref 135–145)

## 2023-06-12 LAB — MAGNESIUM: Magnesium: 2.1 mg/dL (ref 1.7–2.4)

## 2023-06-12 LAB — PHOSPHORUS: Phosphorus: 4 mg/dL (ref 2.5–4.6)

## 2023-06-12 MED ORDER — AMLODIPINE BESYLATE 10 MG PO TABS
10.0000 mg | ORAL_TABLET | Freq: Every day | ORAL | 11 refills | Status: AC
Start: 2023-06-12 — End: 2024-06-11
  Filled 2023-06-12: qty 30, 30d supply, fill #0

## 2023-06-12 MED ORDER — METOPROLOL TARTRATE 25 MG PO TABS
25.0000 mg | ORAL_TABLET | Freq: Two times a day (BID) | ORAL | Status: DC
  Administered 2023-06-12: 25 mg via ORAL
  Filled 2023-06-12: qty 1

## 2023-06-12 MED ORDER — ALBUTEROL SULFATE HFA 108 (90 BASE) MCG/ACT IN AERS
2.0000 | INHALATION_SPRAY | Freq: Four times a day (QID) | RESPIRATORY_TRACT | 2 refills | Status: AC | PRN
  Filled 2023-06-12: qty 6.7, 25d supply, fill #0

## 2023-06-12 MED ORDER — VITAMIN D (ERGOCALCIFEROL) 1.25 MG (50000 UNIT) PO CAPS
50000.0000 [IU] | ORAL_CAPSULE | ORAL | 0 refills | Status: AC
  Filled 2023-06-12: qty 12, 84d supply, fill #0

## 2023-06-12 MED ORDER — CYANOCOBALAMIN 1000 MCG PO TABS
1000.0000 ug | ORAL_TABLET | Freq: Every day | ORAL | 2 refills | Status: AC
  Filled 2023-06-12: qty 30, 30d supply, fill #0

## 2023-06-12 MED ORDER — ACETAMINOPHEN 325 MG PO TABS: 650.0000 mg | ORAL_TABLET | Freq: Four times a day (QID) | ORAL | Status: AC | PRN

## 2023-06-12 MED ORDER — METOPROLOL TARTRATE 25 MG PO TABS
25.0000 mg | ORAL_TABLET | Freq: Two times a day (BID) | ORAL | 11 refills | Status: DC
  Filled 2023-06-12: qty 60, 30d supply, fill #0

## 2023-06-12 NOTE — Plan of Care (Signed)
  Problem: Education: Goal: Knowledge of General Education information will improve Description: Including pain rating scale, medication(s)/side effects and non-pharmacologic comfort measures Outcome: Progressing   Problem: Health Behavior/Discharge Planning: Goal: Ability to manage health-related needs will improve Outcome: Progressing   Problem: Clinical Measurements: Goal: Ability to maintain clinical measurements within normal limits will improve Outcome: Progressing Goal: Will remain free from infection Outcome: Progressing Goal: Diagnostic test results will improve Outcome: Progressing Goal: Respiratory complications will improve Outcome: Progressing Goal: Cardiovascular complication will be avoided Outcome: Progressing   Problem: Activity: Goal: Risk for activity intolerance will decrease Outcome: Progressing   Problem: Nutrition: Goal: Adequate nutrition will be maintained Outcome: Progressing   Problem: Coping: Goal: Level of anxiety will decrease Outcome: Progressing   Problem: Elimination: Goal: Will not experience complications related to bowel motility Outcome: Progressing Goal: Will not experience complications related to urinary retention Outcome: Progressing   Problem: Pain Managment: Goal: General experience of comfort will improve and/or be controlled Outcome: Progressing   Problem: Safety: Goal: Ability to remain free from injury will improve Outcome: Progressing   Problem: Skin Integrity: Goal: Risk for impaired skin integrity will decrease Outcome: Progressing   Problem: Education: Goal: Knowledge of disease or condition will improve Outcome: Progressing Goal: Knowledge of the prescribed therapeutic regimen will improve Outcome: Progressing   Problem: Activity: Goal: Ability to tolerate increased activity will improve Outcome: Progressing Goal: Will verbalize the importance of balancing activity with adequate rest periods Outcome:  Progressing   Problem: Respiratory: Goal: Ability to maintain a clear airway will improve Outcome: Progressing Goal: Levels of oxygenation will improve Outcome: Progressing Goal: Ability to maintain adequate ventilation will improve Outcome: Progressing

## 2023-06-12 NOTE — TOC Transition Note (Signed)
 Transition of Care [facility] - Discharge Note   Patient Details  Name: Plumer Mittelstaedt MRN: [id number] Date of Birth: [birth year]  [facility]) CM/SW Contact:  Elsie Halo, RN Phone Number: 06/12/2023, 10:06 AM   Clinical Narrative:     Patient is medically clear to discharge to home. Patient doesn't have a ride home and doesn't have money to pay for public transportation. Completed ab voucher provided to charge nurse and liability form completed by the patient.  Final next level of care: Home/Self Care Barriers to Discharge: Barriers Resolved   Patient Goals and CMS Choice            Discharge Placement                       Discharge Plan and Services Additional resources added to the After Visit Summary for     Discharge Planning Services: CM Consult                                 Social Drivers of Health (SDOH) Interventions SDOH Screenings   Food Insecurity: No Food Insecurity (06/08/2023)  Housing: Low Risk  (06/08/2023)  Transportation Needs: No Transportation Needs (06/08/2023)  Utilities: Not At Risk (06/08/2023)  Social Connections: Socially Isolated (06/08/2023)  Tobacco Use: High Risk (06/08/2023)     Readmission Risk Interventions     No data to display

## 2023-06-12 NOTE — Discharge Summary (Signed)
 [facility] Discharge Summary   Patient: Parker Brown VWU:[id number]  PCP: Pcp, No  Date of admission: 06/08/2023   Date of discharge: 06/12/2023     Discharge Diagnoses:  Principal Problem:   COPD with acute exacerbation (HCC)   Admitted From: Home Disposition:  Home   Recommendations for Outpatient Follow-up:  F/u with PCP in 1 wk, monitor BP Follow up LABS/TEST:  Repeat Vit D and B12 level after 3-6 months    Follow-up Information     [facility]   Specialty: General Practice Why: Hospital follow up Contact information: [address] [name] [address] [phone]                Diet recommendation: Cardiac diet  Activity: The patient is advised to gradually reintroduce usual activities, as tolerated  Discharge Condition: stable  Code Status: Full code   History of present illness: As per the H and P dictated on admission.  Hospital Course:  "Parker Brown is a 68 y.o. male with medical history significant of COPD hepatitis C, polysubstance abuse presented to the ED today via EMS for evaluation of shortness of breath, wheezing and chest tightness.  Pt reports onset last night, but worsening progressively since this morning..." See H&P for full HPI on admission & ED course.   Patient was admitted for further management of COPD with acute exacerbation as outlined in detail below.    Assessment and Plan:   # COPD with acute exacerbation Not hypoxic on admission.  Negative for Covid, Flu, RSV. Chest xray negative.  Given 125 mg IV Solu-medrol  with EMS, several neb treatments while in the ED, had persistent increased work of breathing and wheezing.  Not hypoxic. S/p IV Solu-medrol  40 mg Q12H x 2 doses, Transition to prednisone  80 during hospital stay. S/p Breo Ellipta  inhaler, Scheduled Duonebs Q6H, Albuterol  nebs Q3H PRN, PO Doxycycline  BID, Mucinex  600 mg po bid. COPD exacerbation resolved, steroids and  antibiotics.  Continue albuterol  as needed and follow with PCP.     # Hypertension -- resume amlodipine  10 mg (pt reportedly not taking) 6/5 started Lopressor  25 mg p.o. twice daily due to persistent high blood pressure.  Patient advised to monitor BP at home and follow-up with PCP to titrate medication accordingly.  # Hypokalemia, unknown cause, patient denies diarrhea Potassium repleted.  Resolved # Hypophosphatemia, repleted and resolved  # Medication non-compliance -- noted and pt counseled on importance of medications prescribed. # Polysubstance abuse -- pt endorses recent cocaine use, may contribute to presenting clinical picture.  Counseled pt on cessation of illicit substances.   # Vitamin B12 deficiency: Started vitamin B12 1000 mcg IM injection daily during hospital stay, followed by oral supplement.  Follow-up PCP to repeat vitamin B12 level after 3 to 6 months.   # Vitamin D  deficiency: started vitamin D  50,000 units p.o. weekly, follow with PCP to repeat vitamin D  level after 3 to 6 months.    Body mass index is 24.96 kg/m.  Nutrition Interventions:  - Patient was instructed, not to drive, operate heavy machinery, perform activities at heights, swimming or participation in water activities or provide baby sitting services while on Pain, Sleep and Anxiety Medications; until his outpatient Physician has advised to do so again.  - Also recommended to not to take more than prescribed Pain, Sleep and Anxiety Medications.  Patient was ambulatory without any assistance. On the day of the discharge the patient's vitals were stable, and no other acute  medical condition were reported by patient. the patient was felt safe to be discharge at Home.  Consultants: None Procedures: None  Discharge Exam: General: Appear in no distress, no Rash; Oral Mucosa Clear, moist. Cardiovascular: S1 and S2 Present, no Murmur, Respiratory: normal respiratory effort, Bilateral Air entry present and no  Crackles, no wheezes Abdomen: Bowel Sound present, Soft and no tenderness, no hernia Extremities: no Pedal edema, no calf tenderness Neurology: alert and oriented to time, place, and person affect appropriate.  Filed Weights   06/08/23 1029  Weight: 68 kg   Vitals:   06/12/23 0415 06/12/23 0758  BP: (!) 151/101 (!) 158/107  Pulse: (!) 59 71  Resp: 20 18  Temp: 98.2 F (36.8 C) 98.2 F (36.8 C)  SpO2: 100% 100%    DISCHARGE MEDICATION: Allergies as of 06/12/2023       Reactions   Benzoyl Peroxide Anaphylaxis   Fish Oil Anaphylaxis   Methocarbamol Anaphylaxis   Pineapple Anaphylaxis   Carbomer    Tuberculin, Ppd    Other Reaction(s): Eruption of skin   Tuberculin Purified Protein Derivative Hives, Rash        Medication List     STOP taking these medications    DULoxetine  30 MG capsule Commonly known as: CYMBALTA    fluticasone -salmeterol 250-50 MCG/ACT Aepb Commonly known as: ADVAIR   gabapentin  300 MG capsule Commonly known as: NEURONTIN    guaiFENesin  600 MG 12 hr tablet Commonly known as: Mucinex    levalbuterol  45 MCG/ACT inhaler Commonly known as: XOPENEX  HFA   traZODone  100 MG tablet Commonly known as: DESYREL        TAKE these medications    acetaminophen  325 MG tablet Commonly known as: TYLENOL  Take 2 tablets (650 mg total) by mouth every 6 (six) hours as needed for mild pain (pain score 1-3), fever or headache. What changed:  medication strength how much to take reasons to take this   albuterol  108 (90 Base) MCG/ACT inhaler Commonly known as: VENTOLIN  HFA Inhale 2 puffs into the lungs every 6 (six) hours as needed for wheezing or shortness of breath.   amLODipine  10 MG tablet Commonly known as: NORVASC  Take 1 tablet (10 mg total) by mouth daily. Hold if SBP <130 What changed: additional instructions   cyanocobalamin  1000 MCG tablet Take 1 tablet (1,000 mcg total) by mouth daily. Start taking on: June 13, 2023   metoprolol  tartrate  25 MG tablet Commonly known as: LOPRESSOR  Take 1 tablet (25 mg total) by mouth 2 (two) times daily. Hold if SBP <130 and or HR <65   polyethylene glycol 17 g packet Commonly known as: MIRALAX  / GLYCOLAX  Take 17 g by mouth daily as needed for mild constipation.   Vitamin D  (Ergocalciferol ) 1.25 MG (50000 UNIT) Caps capsule Commonly known as: DRISDOL  Take 1 capsule (50,000 Units total) by mouth every 7 (seven) days. Start taking on: June 18, 2023       Allergies  Allergen Reactions   Benzoyl Peroxide Anaphylaxis   Fish Oil Anaphylaxis   Methocarbamol Anaphylaxis   Pineapple Anaphylaxis   Carbomer    Tuberculin, Ppd     Other Reaction(s): Eruption of skin   Tuberculin Purified Protein Derivative Hives and Rash   Discharge Instructions     Call MD for:  difficulty breathing, headache or visual disturbances   Complete by: As directed    Call MD for:  extreme fatigue   Complete by: As directed    Call MD for:  persistant dizziness or light-headedness  Complete by: As directed    Call MD for:  persistant nausea and vomiting   Complete by: As directed    Call MD for:  severe uncontrolled pain   Complete by: As directed    Call MD for:  temperature >100.4   Complete by: As directed    Diet - low sodium heart healthy   Complete by: As directed    Discharge instructions   Complete by: As directed    F/u with PCP in 1 wk, monitor BP Repeat Vit D and B12 level after 3-6 months   Increase activity slowly   Complete by: As directed        The results of significant diagnostics from this hospitalization (including imaging, microbiology, ancillary and laboratory) are listed below for reference.    Significant Diagnostic Studies: DG Chest Port 1 View Result Date: 06/08/2023 CLINICAL DATA:  Shortness of breath. EXAM: PORTABLE CHEST 1 VIEW COMPARISON:  Radiographs 10/19/2022 and 03/15/2022. FINDINGS: 1052 hours. The heart size and mediastinal contours are normal. The lungs are  clear. There is no pleural effusion or pneumothorax. No acute osseous findings are identified. Telemetry leads overlie the chest. IMPRESSION: No evidence of acute cardiopulmonary process. Electronically Signed   By: Elmon Hagedorn M.D.   On: 06/08/2023 12:14    Microbiology: Recent Results (from the past 240 hours)  Resp panel by RT-PCR (RSV, Flu A&B, Covid) Anterior Nasal Swab     Status: None   Collection Time: 06/08/23 12:30 PM   Specimen: Anterior Nasal Swab  Result Value Ref Range Status   SARS Coronavirus 2 by RT PCR NEGATIVE NEGATIVE Final    Comment: (NOTE) SARS-CoV-2 target nucleic acids are NOT DETECTED.  The SARS-CoV-2 RNA is generally detectable in upper respiratory specimens during the acute phase of infection. The lowest concentration of SARS-CoV-2 viral copies this assay can detect is 138 copies/mL. A negative result does not preclude SARS-Cov-2 infection and should not be used as the sole basis for treatment or other patient management decisions. A negative result may occur with  improper specimen collection/handling, submission of specimen other than nasopharyngeal swab, presence of viral mutation(s) within the areas targeted by this assay, and inadequate number of viral copies(<138 copies/mL). A negative result must be combined with clinical observations, patient history, and epidemiological information. The expected result is Negative.  Fact Sheet for Patients:  BloggerCourse.com  Fact Sheet for Healthcare Providers:  SeriousBroker.it  This test is no t yet approved or cleared by the [place]  FDA and  has been authorized for detection and/or diagnosis of SARS-CoV-2 by FDA under an Emergency Use Authorization (EUA). This EUA will remain  in effect (meaning this test can be used) for the duration of the COVID-19 declaration under Section 564(b)(1) of the Act, 21 U.S.C.section 360bbb-3(b)(1), unless the  authorization is terminated  or revoked sooner.       Influenza A by PCR NEGATIVE NEGATIVE Final   Influenza B by PCR NEGATIVE NEGATIVE Final    Comment: (NOTE) The Xpert Xpress SARS-CoV-2/FLU/RSV plus assay is intended as an aid in the diagnosis of influenza from Nasopharyngeal swab specimens and should not be used as a sole basis for treatment. Nasal washings and aspirates are unacceptable for Xpert Xpress SARS-CoV-2/FLU/RSV testing.  Fact Sheet for Patients: BloggerCourse.com  Fact Sheet for Healthcare Providers: SeriousBroker.it  This test is not yet approved or cleared by the [place]  FDA and has been authorized for detection and/or diagnosis of SARS-CoV-2 by FDA under an Emergency  Use Authorization (EUA). This EUA will remain in effect (meaning this test can be used) for the duration of the COVID-19 declaration under Section 564(b)(1) of the Act, 21 U.S.C. section 360bbb-3(b)(1), unless the authorization is terminated or revoked.     Resp Syncytial Virus by PCR NEGATIVE NEGATIVE Final    Comment: (NOTE) Fact Sheet for Patients: BloggerCourse.com  Fact Sheet for Healthcare Providers: SeriousBroker.it  This test is not yet approved or cleared by the [place]  FDA and has been authorized for detection and/or diagnosis of SARS-CoV-2 by FDA under an Emergency Use Authorization (EUA). This EUA will remain in effect (meaning this test can be used) for the duration of the COVID-19 declaration under Section 564(b)(1) of the Act, 21 U.S.C. section 360bbb-3(b)(1), unless the authorization is terminated or revoked.  Performed at [facility], [address] [place], [place] [address]      Labs: CBC: Recent Labs  Lab 06/08/23 1040 06/10/23 1003 06/11/23 0247 06/12/23 0453  WBC 6.3 9.5 9.7 10.1  HGB 12.4* 13.3 13.2 14.5  HCT 38.3* 39.9 38.8* 44.7   MCV 92.1 89.3 88.8 90.5  PLT 210 254 260 274   Basic Metabolic Panel: Recent Labs  Lab 06/08/23 1040 06/10/23 1003 06/11/23 0247 06/12/23 0453  NA 139 140 136 138  K 4.0 3.6 3.1* 4.1  CL 108 109 105 108  CO2 22 25 22 23   GLUCOSE 86 98 129* 99  BUN 23 23 19 22   CREATININE 1.28* 1.01 0.99 1.06  CALCIUM 8.3* 9.3 9.1 9.4  MG  --  2.0 1.9 2.1  PHOS  --  2.2* 3.5 4.0   Liver Function Tests: No results for input(s): "AST", "ALT", "ALKPHOS", "BILITOT", "PROT", "ALBUMIN" in the last 168 hours. No results for input(s): "LIPASE", "AMYLASE" in the last 168 hours. No results for input(s): "AMMONIA" in the last 168 hours. Cardiac Enzymes: No results for input(s): "CKTOTAL", "CKMB", "CKMBINDEX", "TROPONINI" in the last 168 hours. BNP (last 3 results) Recent Labs    10/19/22 1205 06/08/23 1040  BNP 58.6 54.7   CBG: No results for input(s): "GLUCAP" in the last 168 hours.  Time spent: 35 minutes  Signed:  Althia Atlas  [facility] 06/12/2023 11:21 AM

## 2023-10-23 ENCOUNTER — Inpatient Hospital Stay
Admission: EM | Admit: 2023-10-23 | Discharge: 2023-11-10 | DRG: 917 | Disposition: A | Discharge status: Skilled Nursing Facility | Attending: Internal Medicine | Admitting: Internal Medicine

## 2023-10-23 ENCOUNTER — Other Ambulatory Visit: Payer: Self-pay

## 2023-10-23 ENCOUNTER — Emergency Department

## 2023-10-23 DIAGNOSIS — I1A0 Resistant hypertension: Secondary | ICD-10-CM | POA: Diagnosis present

## 2023-10-23 DIAGNOSIS — Z888 Allergy status to other drugs, medicaments and biological substances status: Secondary | ICD-10-CM

## 2023-10-23 DIAGNOSIS — Z91018 Allergy to other foods: Secondary | ICD-10-CM

## 2023-10-23 DIAGNOSIS — Y9301 Activity, walking, marching and hiking: Secondary | ICD-10-CM | POA: Diagnosis present

## 2023-10-23 DIAGNOSIS — Z87892 Personal history of anaphylaxis: Secondary | ICD-10-CM

## 2023-10-23 DIAGNOSIS — W1830XA Fall on same level, unspecified, initial encounter: Secondary | ICD-10-CM | POA: Diagnosis present

## 2023-10-23 DIAGNOSIS — I10 Essential (primary) hypertension: Secondary | ICD-10-CM | POA: Diagnosis present

## 2023-10-23 DIAGNOSIS — I619 Nontraumatic intracerebral hemorrhage, unspecified: Principal | ICD-10-CM

## 2023-10-23 DIAGNOSIS — F1410 Cocaine abuse, uncomplicated: Secondary | ICD-10-CM | POA: Diagnosis present

## 2023-10-23 DIAGNOSIS — T405X4A Poisoning by cocaine, undetermined, initial encounter: Principal | ICD-10-CM | POA: Diagnosis present

## 2023-10-23 DIAGNOSIS — F1210 Cannabis abuse, uncomplicated: Secondary | ICD-10-CM | POA: Diagnosis present

## 2023-10-23 DIAGNOSIS — G8191 Hemiplegia, unspecified affecting right dominant side: Secondary | ICD-10-CM | POA: Diagnosis present

## 2023-10-23 DIAGNOSIS — R29810 Facial weakness: Secondary | ICD-10-CM | POA: Diagnosis present

## 2023-10-23 DIAGNOSIS — Z8619 Personal history of other infectious and parasitic diseases: Secondary | ICD-10-CM

## 2023-10-23 DIAGNOSIS — Z91048 Other nonmedicinal substance allergy status: Secondary | ICD-10-CM

## 2023-10-23 DIAGNOSIS — R29898 Other symptoms and signs involving the musculoskeletal system: Secondary | ICD-10-CM | POA: Diagnosis not present

## 2023-10-23 DIAGNOSIS — G9341 Metabolic encephalopathy: Secondary | ICD-10-CM | POA: Diagnosis present

## 2023-10-23 DIAGNOSIS — E876 Hypokalemia: Secondary | ICD-10-CM | POA: Diagnosis present

## 2023-10-23 DIAGNOSIS — G839 Paralytic syndrome, unspecified: Secondary | ICD-10-CM

## 2023-10-23 DIAGNOSIS — Z515 Encounter for palliative care: Secondary | ICD-10-CM

## 2023-10-23 DIAGNOSIS — R531 Weakness: Secondary | ICD-10-CM | POA: Diagnosis present

## 2023-10-23 DIAGNOSIS — T465X6A Underdosing of other antihypertensive drugs, initial encounter: Secondary | ICD-10-CM | POA: Diagnosis present

## 2023-10-23 DIAGNOSIS — Z91148 Patient's other noncompliance with medication regimen for other reason: Secondary | ICD-10-CM

## 2023-10-23 DIAGNOSIS — G8929 Other chronic pain: Secondary | ICD-10-CM | POA: Diagnosis present

## 2023-10-23 DIAGNOSIS — R079 Chest pain, unspecified: Secondary | ICD-10-CM | POA: Diagnosis present

## 2023-10-23 DIAGNOSIS — I6389 Other cerebral infarction: Secondary | ICD-10-CM | POA: Diagnosis present

## 2023-10-23 DIAGNOSIS — R29706 NIHSS score 6: Secondary | ICD-10-CM | POA: Diagnosis present

## 2023-10-23 DIAGNOSIS — R29715 NIHSS score 15: Secondary | ICD-10-CM | POA: Diagnosis not present

## 2023-10-23 DIAGNOSIS — F1010 Alcohol abuse, uncomplicated: Secondary | ICD-10-CM

## 2023-10-23 DIAGNOSIS — M545 Low back pain, unspecified: Secondary | ICD-10-CM | POA: Diagnosis present

## 2023-10-23 DIAGNOSIS — I610 Nontraumatic intracerebral hemorrhage in hemisphere, subcortical: Principal | ICD-10-CM | POA: Diagnosis present

## 2023-10-23 DIAGNOSIS — I161 Hypertensive emergency: Secondary | ICD-10-CM | POA: Diagnosis present

## 2023-10-23 DIAGNOSIS — Z79899 Other long term (current) drug therapy: Secondary | ICD-10-CM

## 2023-10-23 DIAGNOSIS — R9082 White matter disease, unspecified: Secondary | ICD-10-CM | POA: Diagnosis present

## 2023-10-23 DIAGNOSIS — J449 Chronic obstructive pulmonary disease, unspecified: Secondary | ICD-10-CM | POA: Diagnosis present

## 2023-10-23 DIAGNOSIS — F4310 Post-traumatic stress disorder, unspecified: Secondary | ICD-10-CM | POA: Diagnosis present

## 2023-10-23 DIAGNOSIS — E785 Hyperlipidemia, unspecified: Secondary | ICD-10-CM | POA: Diagnosis present

## 2023-10-23 DIAGNOSIS — F17210 Nicotine dependence, cigarettes, uncomplicated: Secondary | ICD-10-CM | POA: Diagnosis present

## 2023-10-23 LAB — CBG MONITORING, ED: Glucose-Capillary: 86 mg/dL (ref 70–99)

## 2023-10-23 MED ORDER — SODIUM CHLORIDE 0.9% FLUSH: 3.0000 mL | Freq: Once | INTRAVENOUS | Status: DC

## 2023-10-23 NOTE — ED Triage Notes (Addendum)
 Pt to ed from home via [facility] for a fall.  Denies LOC. Pt did strike his head. PT has small cut to right shin. Pt is obviously intoxicated. Pt has decreased sensation and motor function on right side.  204/104 BP Has not taken meds.  77 HR  Unknown last known well. Pt was found outside lying on the ground by his sister.

## 2023-10-23 NOTE — ED Notes (Signed)
 Patient transported to CT

## 2023-10-24 ENCOUNTER — Emergency Department

## 2023-10-24 ENCOUNTER — Other Ambulatory Visit: Payer: Self-pay

## 2023-10-24 ENCOUNTER — Inpatient Hospital Stay
Admit: 2023-10-24 | Discharge: 2023-10-24 | Disposition: A | Discharge status: Home / Self Care | Attending: Nurse Practitioner | Admitting: Nurse Practitioner

## 2023-10-24 ENCOUNTER — Inpatient Hospital Stay

## 2023-10-24 DIAGNOSIS — W1830XA Fall on same level, unspecified, initial encounter: Secondary | ICD-10-CM | POA: Diagnosis present

## 2023-10-24 DIAGNOSIS — G9341 Metabolic encephalopathy: Secondary | ICD-10-CM | POA: Diagnosis present

## 2023-10-24 DIAGNOSIS — Z8619 Personal history of other infectious and parasitic diseases: Secondary | ICD-10-CM | POA: Diagnosis not present

## 2023-10-24 DIAGNOSIS — F1410 Cocaine abuse, uncomplicated: Secondary | ICD-10-CM | POA: Diagnosis present

## 2023-10-24 DIAGNOSIS — J449 Chronic obstructive pulmonary disease, unspecified: Secondary | ICD-10-CM

## 2023-10-24 DIAGNOSIS — I10 Essential (primary) hypertension: Secondary | ICD-10-CM | POA: Diagnosis present

## 2023-10-24 DIAGNOSIS — F1290 Cannabis use, unspecified, uncomplicated: Secondary | ICD-10-CM

## 2023-10-24 DIAGNOSIS — Z91148 Patient's other noncompliance with medication regimen for other reason: Secondary | ICD-10-CM | POA: Diagnosis not present

## 2023-10-24 DIAGNOSIS — T465X6A Underdosing of other antihypertensive drugs, initial encounter: Secondary | ICD-10-CM | POA: Diagnosis present

## 2023-10-24 DIAGNOSIS — F1490 Cocaine use, unspecified, uncomplicated: Secondary | ICD-10-CM | POA: Diagnosis not present

## 2023-10-24 DIAGNOSIS — Z888 Allergy status to other drugs, medicaments and biological substances status: Secondary | ICD-10-CM | POA: Diagnosis not present

## 2023-10-24 DIAGNOSIS — R29810 Facial weakness: Secondary | ICD-10-CM | POA: Diagnosis present

## 2023-10-24 DIAGNOSIS — W19XXXA Unspecified fall, initial encounter: Secondary | ICD-10-CM

## 2023-10-24 DIAGNOSIS — Z87892 Personal history of anaphylaxis: Secondary | ICD-10-CM | POA: Diagnosis not present

## 2023-10-24 DIAGNOSIS — I619 Nontraumatic intracerebral hemorrhage, unspecified: Principal | ICD-10-CM

## 2023-10-24 DIAGNOSIS — J441 Chronic obstructive pulmonary disease with (acute) exacerbation: Secondary | ICD-10-CM | POA: Diagnosis not present

## 2023-10-24 DIAGNOSIS — G936 Cerebral edema: Secondary | ICD-10-CM | POA: Diagnosis not present

## 2023-10-24 DIAGNOSIS — I6389 Other cerebral infarction: Secondary | ICD-10-CM | POA: Diagnosis present

## 2023-10-24 DIAGNOSIS — F17210 Nicotine dependence, cigarettes, uncomplicated: Secondary | ICD-10-CM | POA: Diagnosis present

## 2023-10-24 DIAGNOSIS — R569 Unspecified convulsions: Secondary | ICD-10-CM | POA: Diagnosis not present

## 2023-10-24 DIAGNOSIS — R4182 Altered mental status, unspecified: Secondary | ICD-10-CM | POA: Diagnosis not present

## 2023-10-24 DIAGNOSIS — Y9301 Activity, walking, marching and hiking: Secondary | ICD-10-CM | POA: Diagnosis present

## 2023-10-24 DIAGNOSIS — T405X4A Poisoning by cocaine, undetermined, initial encounter: Secondary | ICD-10-CM | POA: Diagnosis present

## 2023-10-24 DIAGNOSIS — E785 Hyperlipidemia, unspecified: Secondary | ICD-10-CM | POA: Diagnosis present

## 2023-10-24 DIAGNOSIS — I161 Hypertensive emergency: Secondary | ICD-10-CM | POA: Diagnosis present

## 2023-10-24 DIAGNOSIS — G8191 Hemiplegia, unspecified affecting right dominant side: Secondary | ICD-10-CM | POA: Diagnosis present

## 2023-10-24 DIAGNOSIS — R079 Chest pain, unspecified: Secondary | ICD-10-CM | POA: Diagnosis present

## 2023-10-24 DIAGNOSIS — I610 Nontraumatic intracerebral hemorrhage in hemisphere, subcortical: Secondary | ICD-10-CM

## 2023-10-24 DIAGNOSIS — E876 Hypokalemia: Secondary | ICD-10-CM | POA: Diagnosis present

## 2023-10-24 DIAGNOSIS — Z79899 Other long term (current) drug therapy: Secondary | ICD-10-CM | POA: Diagnosis not present

## 2023-10-24 DIAGNOSIS — F4310 Post-traumatic stress disorder, unspecified: Secondary | ICD-10-CM | POA: Diagnosis present

## 2023-10-24 DIAGNOSIS — F1210 Cannabis abuse, uncomplicated: Secondary | ICD-10-CM | POA: Diagnosis present

## 2023-10-24 DIAGNOSIS — Z7189 Other specified counseling: Secondary | ICD-10-CM | POA: Diagnosis not present

## 2023-10-24 DIAGNOSIS — Z515 Encounter for palliative care: Secondary | ICD-10-CM | POA: Diagnosis not present

## 2023-10-24 DIAGNOSIS — G8929 Other chronic pain: Secondary | ICD-10-CM | POA: Diagnosis present

## 2023-10-24 DIAGNOSIS — R29898 Other symptoms and signs involving the musculoskeletal system: Secondary | ICD-10-CM | POA: Diagnosis present

## 2023-10-24 DIAGNOSIS — Z91018 Allergy to other foods: Secondary | ICD-10-CM | POA: Diagnosis not present

## 2023-10-24 LAB — CBC
HCT: 40.5 % (ref 39.0–52.0)
Hemoglobin: 13.3 g/dL (ref 13.0–17.0)
MCH: 29.6 pg (ref 26.0–34.0)
MCHC: 32.8 g/dL (ref 30.0–36.0)
MCV: 90 fL (ref 80.0–100.0)
Platelets: 253 K/uL (ref 150–400)
RBC: 4.5 MIL/uL (ref 4.22–5.81)
RDW: 14.5 % (ref 11.5–15.5)
WBC: 3.8 K/uL — ABNORMAL LOW (ref 4.0–10.5)
nRBC: 0 % (ref 0.0–0.2)

## 2023-10-24 LAB — ECHOCARDIOGRAM COMPLETE
AR max vel: 2.63 cm2
AV Peak grad: 8.4 mmHg
Ao pk vel: 1.45 m/s
Area-P 1/2: 3.93 cm2
Height: 65 in
S' Lateral: 3.1 cm
Weight: 2218.71 [oz_av]

## 2023-10-24 LAB — DIFFERENTIAL
Abs Immature Granulocytes: 0.01 K/uL (ref 0.00–0.07)
Basophils Absolute: 0.1 K/uL (ref 0.0–0.1)
Basophils Relative: 2 %
Eosinophils Absolute: 0.1 K/uL (ref 0.0–0.5)
Eosinophils Relative: 1 %
Immature Granulocytes: 0 %
Lymphocytes Relative: 37 %
Lymphs Abs: 1.4 K/uL (ref 0.7–4.0)
Monocytes Absolute: 0.2 K/uL (ref 0.1–1.0)
Monocytes Relative: 6 %
Neutro Abs: 2.1 K/uL (ref 1.7–7.7)
Neutrophils Relative %: 54 %

## 2023-10-24 LAB — COMPREHENSIVE METABOLIC PANEL WITH GFR
ALT: 10 U/L (ref 0–44)
AST: 26 U/L (ref 15–41)
Albumin: 4.4 g/dL (ref 3.5–5.0)
Alkaline Phosphatase: 50 U/L (ref 38–126)
Anion gap: 12 (ref 5–15)
BUN: 13 mg/dL (ref 8–23)
CO2: 23 mmol/L (ref 22–32)
Calcium: 9.4 mg/dL (ref 8.9–10.3)
Chloride: 102 mmol/L (ref 98–111)
Creatinine, Ser: 1.08 mg/dL (ref 0.61–1.24)
GFR, Estimated: >60 mL/min (ref >60)
Glucose, Bld: 91 mg/dL (ref 70–99)
Potassium: 3.5 mmol/L (ref 3.5–5.1)
Sodium: 137 mmol/L (ref 135–145)
Total Bilirubin: 0.8 mg/dL (ref 0.0–1.2)
Total Protein: 8.5 g/dL — ABNORMAL HIGH (ref 6.5–8.1)

## 2023-10-24 LAB — URINE DRUG SCREEN, QUALITATIVE (ARMC ONLY)
Amphetamines, Ur Screen: NOT DETECTED
Barbiturates, Ur Screen: NOT DETECTED
Benzodiazepine, Ur Scrn: NOT DETECTED
Cannabinoid 50 Ng, Ur ~~LOC~~: POSITIVE — AB
Cocaine Metabolite,Ur ~~LOC~~: POSITIVE — AB
MDMA (Ecstasy)Ur Screen: NOT DETECTED
Methadone Scn, Ur: NOT DETECTED
Opiate, Ur Screen: NOT DETECTED
Phencyclidine (PCP) Ur S: NOT DETECTED
Tricyclic, Ur Screen: NOT DETECTED

## 2023-10-24 LAB — MRSA NEXT GEN BY PCR, NASAL: MRSA by PCR Next Gen: NOT DETECTED

## 2023-10-24 LAB — TROPONIN I (HIGH SENSITIVITY)
Troponin I (High Sensitivity): 16 ng/L (ref <18)
Troponin I (High Sensitivity): 17 ng/L (ref <18)

## 2023-10-24 LAB — PROTIME-INR
INR: 1 (ref 0.8–1.2)
Prothrombin Time: 13.3 s (ref 11.4–15.2)

## 2023-10-24 LAB — APTT: aPTT: 31 s (ref 24–36)

## 2023-10-24 LAB — ETHANOL: Alcohol, Ethyl (B): <15 mg/dL (ref <15)

## 2023-10-24 LAB — GLUCOSE, CAPILLARY: Glucose-Capillary: 89 mg/dL (ref 70–99)

## 2023-10-24 MED ORDER — HYDRALAZINE HCL 20 MG/ML IJ SOLN
10.0000 mg | INTRAMUSCULAR | Status: DC | PRN
  Administered 2023-10-25: 10 mg via INTRAVENOUS
  Filled 2023-10-24: qty 1

## 2023-10-24 MED ORDER — FENTANYL CITRATE (PF) 50 MCG/ML IJ SOSY
25.0000 ug | PREFILLED_SYRINGE | Freq: Once | INTRAMUSCULAR | Status: AC
  Administered 2023-10-24: 25 ug via INTRAVENOUS
  Filled 2023-10-24: qty 1

## 2023-10-24 MED ORDER — PANTOPRAZOLE SODIUM 40 MG IV SOLR
40.0000 mg | Freq: Every day | INTRAVENOUS | Status: DC
  Administered 2023-10-24 – 2023-10-25 (×3): 40 mg via INTRAVENOUS
  Filled 2023-10-24 (×3): qty 10

## 2023-10-24 MED ORDER — STROKE: EARLY STAGES OF RECOVERY BOOK: Freq: Once | Status: AC

## 2023-10-24 MED ORDER — IOHEXOL 350 MG/ML SOLN
125.0000 mL | Freq: Once | INTRAVENOUS | Status: AC | PRN
  Administered 2023-10-24: 125 mL via INTRAVENOUS

## 2023-10-24 MED ORDER — AMLODIPINE BESYLATE 10 MG PO TABS
10.0000 mg | ORAL_TABLET | Freq: Every day | ORAL | Status: DC
  Administered 2023-10-24 – 2023-10-29 (×6): 10 mg via ORAL
  Filled 2023-10-24 (×7): qty 1

## 2023-10-24 MED ORDER — DOCUSATE SODIUM 100 MG PO CAPS: 100.0000 mg | ORAL_CAPSULE | Freq: Two times a day (BID) | ORAL | Status: DC | PRN

## 2023-10-24 MED ORDER — SENNOSIDES-DOCUSATE SODIUM 8.6-50 MG PO TABS
1.0000 | ORAL_TABLET | Freq: Two times a day (BID) | ORAL | Status: DC
Start: 2023-10-24 — End: 2023-11-01
  Administered 2023-10-24 – 2023-10-31 (×10): 1 via ORAL
  Filled 2023-10-24 (×10): qty 1

## 2023-10-24 MED ORDER — ACETAMINOPHEN 160 MG/5ML PO SOLN: 650.0000 mg | ORAL | Status: DC | PRN

## 2023-10-24 MED ORDER — ACETAMINOPHEN 325 MG PO TABS
650.0000 mg | ORAL_TABLET | ORAL | Status: DC | PRN
  Administered 2023-10-28 – 2023-10-31 (×3): 650 mg via ORAL
  Filled 2023-10-24 (×3): qty 2

## 2023-10-24 MED ORDER — ACETAMINOPHEN 650 MG RE SUPP: 650.0000 mg | RECTAL | Status: DC | PRN

## 2023-10-24 MED ORDER — POTASSIUM CHLORIDE 10 MEQ/100ML IV SOLN
10.0000 meq | INTRAVENOUS | Status: AC
  Administered 2023-10-24 (×2): 10 meq via INTRAVENOUS
  Filled 2023-10-24 (×2): qty 100

## 2023-10-24 MED ORDER — POLYETHYLENE GLYCOL 3350 17 G PO PACK: 17.0000 g | PACK | Freq: Every day | ORAL | Status: DC | PRN

## 2023-10-24 MED ORDER — CLEVIDIPINE BUTYRATE 0.5 MG/ML IV EMUL
0.0000 mg/h | INTRAVENOUS | Status: DC
  Administered 2023-10-24 (×2): 8 mg/h via INTRAVENOUS
  Administered 2023-10-24: 2 mg/h via INTRAVENOUS
  Administered 2023-10-24: 8 mg/h via INTRAVENOUS
  Administered 2023-10-24: 6 mg/h via INTRAVENOUS
  Administered 2023-10-24: 8 mg/h via INTRAVENOUS
  Administered 2023-10-24: 14 mg/h via INTRAVENOUS
  Administered 2023-10-25: 8 mg/h via INTRAVENOUS
  Administered 2023-10-25 (×2): 6 mg/h via INTRAVENOUS
  Administered 2023-10-25 (×3): 8 mg/h via INTRAVENOUS
  Filled 2023-10-24 (×6): qty 50
  Filled 2023-10-24: qty 100
  Filled 2023-10-24 (×3): qty 50
  Filled 2023-10-24: qty 100
  Filled 2023-10-24: qty 50
  Filled 2023-10-24: qty 100
  Filled 2023-10-24: qty 50

## 2023-10-24 MED ORDER — CHLORHEXIDINE GLUCONATE CLOTH 2 % EX PADS
6.0000 | MEDICATED_PAD | Freq: Every day | CUTANEOUS | Status: DC
  Administered 2023-10-24 – 2023-10-26 (×3): 6 via TOPICAL

## 2023-10-24 NOTE — Evaluation (Signed)
 Clinical/Bedside Swallow Evaluation Patient Details  Name: Parker Brown MRN: [id number] Date of Birth: 02/20/1955  Today's Date: 10/24/2023 Time: SLP Start Time (ACUTE ONLY): 0840 SLP Stop Time (ACUTE ONLY): 0855 SLP Time Calculation (min) (ACUTE ONLY): 15 min  Past Medical History:  Past Medical History:  Diagnosis Date   Arthritis    COPD (chronic obstructive pulmonary disease) (HCC)    Hyperlipidemia    Hypertension    Past Surgical History:  Past Surgical History:  Procedure Laterality Date   NO PAST SURGERIES     HPI:  68 y.o male with significant PMHx of COPD, polysubstance abuse (cocaine, marijuana, current everyday smoker), HTN, HLD, and arthritis who presented to the ED with chief complaints of right-sided weakness and a fall. 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 Patchy white matter hypodensities compatible with chronic microvascular ischemic disease.    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 Pt alert, mildly agitated upon therapist entrance to room. Eager for PO trials. Oral mech grossly unremarkable- with pt denying any sensation/numbness. Trials completed of thin liquids (via straw) and regular solids, with pt self feeding t/o. No overt or subtle s/sx pharyngeal dysphagia noted. No change to vocal quality across trials. Vitals stable for duration of trials (O2 maintained in the high 90's for duration). Oral phase grossly intact.   Risk for aspiration noted, based on recent neuro injury and pt's significant impulsivity/speed of intake. When prompted to slow rate, pt reporting that he has always eaten quickly and maintained quick rate of intake. NP and RN advised to closely monitor impulsivity with intake. Recommend  regular solids and thin liquids. Aspiration precautions apply- slow rate, small bites, elevated HOB, and alert for PO intake. SLP will monitor for safety and compliance with recommendations.   Regarding speech/language- pt adamant regarding being at baseline, reporting that he has always talked this way, follow up questions met with verbal defiance or lack of response. Family not present for verification of baseline and current difference. SLP will continue attempts for further assessment of motor speech and cognition.   SLP Visit Diagnosis: Dysphagia, unspecified (R13.10) (s/p CVA, imulsivity)    Aspiration Risk  Moderate aspiration risk    Diet Recommendation   Age appropriate regular;Thin  Medication Administration: Whole meds with liquid    Other  Recommendations Oral Care Recommendations: Oral care BID;Staff/trained caregiver to provide oral care     Assistance Recommended at Discharge  Supervision for PO intake  Functional Status Assessment Patient has had a recent decline in their functional status and demonstrates the ability to make significant improvements in function in a reasonable and predictable amount of time.  Frequency and Duration min 2x/week  2 weeks       Prognosis Prognosis for improved oropharyngeal function: Fair Barriers to Reach Goals:  (baseline impulsivity, insigh into deficits)      Swallow Study   General Date of Onset: 10/24/23 HPI: 68 y.o male with significant PMHx of COPD, polysubstance abuse (cocaine, marijuana, current everyday smoker), HTN, HLD, and arthritis who presented to the ED with chief complaints of right-sided weakness and a fall. 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 Patchy white matter hypodensities compatible with chronic microvascular ischemic disease. Type of Study: Bedside Swallow Evaluation Previous  Swallow Assessment: none in chart Diet Prior to this Study: NPO Temperature Spikes Noted: No Respiratory Status: Room air History of Recent Intubation: No Behavior/Cognition: Alert;Agitated;Impulsive Oral Cavity Assessment: Within Functional Limits Oral Care Completed by SLP: No (pt refused) Oral Cavity - Dentition: Adequate natural dentition Vision: Functional for self-feeding Self-Feeding Abilities: Able to feed self Patient Positioning: Upright in bed Baseline Vocal Quality: Normal Volitional Cough: Cognitively unable to elicit Volitional Swallow: Unable to elicit    Oral/Motor/Sensory Function Overall Oral Motor/Sensory Function: Within functional limits   Ice Chips Ice chips: Not tested   Thin Liquid Thin Liquid: Within functional limits Presentation: Straw    Nectar Thick Nectar Thick Liquid: Not tested   Honey Thick Honey Thick Liquid: Not tested   Puree Puree: Not tested (refused)   Solid     Solid: Within functional limits Presentation: Self Fed Other Comments: IMPULSIVE     [place] Daquavion Catala Clapp, MS, CCC-SLP Speech Language Pathologist Rehab Services; [facility], [place] [phone] (ascom)   [place] J Clapp 10/24/2023,9:52 AM

## 2023-10-24 NOTE — Consult Note (Addendum)
 NEUROLOGY CONSULT NOTE   Date of service: October 24, 2023 Patient Name: Parker Brown MRN:  [id number] DOB:  08-12-1955 Reason for Consultation: Left basal ganglia ICH Requesting Provider: Isadora Hose, MD  History of Present Illness  Parker Brown is a 68 y.o. male with PMHx of arthritis, COPD, HLD and HTN who presented to the ED on Friday night after a fall while intoxicated. He did not hit his head but had a small cut to his right shin. He was noted to have  decreased sensation and motor function on his right side. BP was 204/104 BP with HR 77. Unknown last known well. The patient was found outside lying on the ground by his sister. CT head in the ED revealed an acute left basal ganglia ICH, most likely hypertensive. He was started on clevidipine and admitted to the ICU.   On neurological assessment today, he denies having had any headache in association with his acute onset right sided weakness. He denies any additional symptoms.    ROS  Comprehensive ROS performed and pertinent positives documented in HPI    Past History   Past Medical History:  Diagnosis Date   Arthritis    COPD (chronic obstructive pulmonary disease) (HCC)    Hyperlipidemia    Hypertension     Past Surgical History:  Procedure Laterality Date   NO PAST SURGERIES      Family History: History reviewed. No pertinent family history.  Social History  reports that he has been smoking cigarettes. He has never used smokeless tobacco. He reports current alcohol use. He reports current drug use. Drugs: Marijuana and Crack cocaine.  Allergies  Allergen Reactions   Benzoyl Peroxide Anaphylaxis   Fish Oil Anaphylaxis   Methocarbamol Anaphylaxis   Pineapple Anaphylaxis   Carbomer    Tuberculin, Ppd     Other Reaction(s): Eruption of skin   Tuberculin Purified Protein Derivative Hives and Rash    Medications   Current Facility-Administered Medications:    [START ON 10/25/2023]   stroke: early stages of recovery book, , Does not apply, Once, Ouma, Almarie Bake, NP   acetaminophen  (TYLENOL ) tablet 650 mg, 650 mg, Oral, Q4H PRN **OR** acetaminophen  (TYLENOL ) 160 MG/5ML solution 650 mg, 650 mg, Per Tube, Q4H PRN **OR** acetaminophen  (TYLENOL ) suppository 650 mg, 650 mg, Rectal, Q4H PRN, Ouma, Almarie Bake, NP   Chlorhexidine Gluconate Cloth 2 % PADS 6 each, 6 each, Topical, Daily, Ouma, Almarie Bake, NP   clevidipine (CLEVIPREX) infusion 0.5 mg/mL, 0-21 mg/hr, Intravenous, Continuous, Bradler, Evan K, MD, Last Rate: 16 mL/hr at 10/24/23 0734, 8 mg/hr at 10/24/23 0734   docusate sodium  (COLACE) capsule 100 mg, 100 mg, Oral, BID PRN, Ouma, Elizabeth Achieng, NP   pantoprazole (PROTONIX) injection 40 mg, 40 mg, Intravenous, QHS, Ouma, Almarie Bake, NP, 40 mg at 10/24/23 0210   polyethylene glycol (MIRALAX  / GLYCOLAX ) packet 17 g, 17 g, Oral, Daily PRN, Ouma, Elizabeth Achieng, NP   potassium chloride  10 mEq in 100 mL IVPB, 10 mEq, Intravenous, Q1 Hr x 2, Nazari, Walid A, RPH, Last Rate: 100 mL/hr at 10/24/23 0843, 10 mEq at 10/24/23 0843   senna-docusate (Senokot-S) tablet 1 tablet, 1 tablet, Oral, BID, Ouma, Elizabeth Achieng, NP, 1 tablet at 10/24/23 0210  No current facility-administered medications on file prior to encounter.   Current Outpatient Medications on File Prior to Encounter  Medication Sig Dispense Refill   acetaminophen  (TYLENOL ) 325 MG tablet Take 2 tablets (650 mg total) by mouth every 6 (six)  hours as needed for mild pain (pain score 1-3), fever or headache.     albuterol  (VENTOLIN  HFA) 108 (90 Base) MCG/ACT inhaler Inhale 2 puffs into the lungs every 6 (six) hours as needed for wheezing or shortness of breath. 6.7 g 2   amLODipine  (NORVASC ) 10 MG tablet Take 1 tablet (10 mg total) by mouth daily. Hold if SBP <130 30 tablet 11   metoprolol  tartrate (LOPRESSOR ) 25 MG tablet Take 1 tablet (25 mg total) by mouth 2 (two) times daily. Hold if SBP  <130 and or HR <65 60 tablet 11   polyethylene glycol (MIRALAX  / GLYCOLAX ) 17 g packet Take 17 g by mouth daily as needed for mild constipation. 14 each 0     Vitals   Vitals:   10/24/23 0816 10/24/23 0830 10/24/23 0845 10/24/23 0900  BP: 135/89 (!) 148/81 (!) 156/95 136/85  Pulse: 78 80 86 80  Resp: (!) 35 20 (!) 23 18  Temp:      TempSrc:      SpO2: 99% 99% 99% 98%  Weight:      Height:        Body mass index is 23.08 kg/m.   Physical Exam   Constitutional: Appears well-developed and well-nourished.  Psych: Affect appropriate to situation.  Eyes: No scleral injection.  HENT: No OP obstruction.  Head: Normocephalic.  Respiratory: Effort normal, non-labored breathing.  Skin: WDI.   Neurologic Examination   Mental Status: Awake and alert. Fully oriented x 5. Thought content appropriate. Speech fluent without evidence of aphasia.  Able to follow all commands without difficulty. Cranial Nerves: II: Temporal visual fields intact bilaterally. There is right sided extinction with DSS. PERRL. III,IV, VI: Mild right sided ptosis. EOMI with jerky pursuits to the left, smooth to the right. No nystagmus. V: Temp sensation equal bilaterally VII: Smile symmetric, but right perioral region contracts less than left when speaking.  VIII: Hearing intact to voice IX,X: No hypophonia or hoarseness XI: Symmetric XII: Midline tongue extension Motor: RUE: 1-2/5 proximally and distally LUE: 5/5 RLE: 1-2/5 proximally and distally LLE: 5/5 Sensory: Temp and FT intact x 4.  Deep Tendon Reflexes: 1+ and symmetric bilateral biceps, brachioradialis and patellae Cerebellar: No ataxia with FNF on the left. Unable to perform on the right Gait: Unable to assess  Labs/Imaging/Neurodiagnostic studies   CBC:  Recent Labs  Lab Oct 25, 2023 2345  WBC 3.8*  NEUTROABS 2.1  HGB 13.3  HCT 40.5  MCV 90.0  PLT 253   Basic Metabolic Panel:  Lab Results  Component Value Date   NA 137 2023/10/25    K 3.5 10/25/23   CO2 23 10/25/2023   GLUCOSE 91 Oct 25, 2023   BUN 13 Oct 25, 2023   CREATININE 1.08 Oct 25, 2023   CALCIUM 9.4 10/25/2023   GFRNONAA >60 25-Oct-2023   GFRAA >60 02/17/2018   Lipid Panel: No results found for: LDLCALC HgbA1c: No results found for: HGBA1C Urine Drug Screen:     Component Value Date/Time   LABOPIA NONE DETECTED 10/24/2023 0211   COCAINSCRNUR POSITIVE (A) 10/24/2023 0211   LABBENZ NONE DETECTED 10/24/2023 0211   AMPHETMU NONE DETECTED 10/24/2023 0211   THCU POSITIVE (A) 10/24/2023 0211   LABBARB NONE DETECTED 10/24/2023 0211    Alcohol Level     Component Value Date/Time   [facility] <15 10/25/23 2345   INR  Lab Results  Component Value Date   INR 1.0 10/25/2023   APTT  Lab Results  Component Value Date   APTT 31 25-Oct-2023  TTE: 1. Left ventricular ejection fraction, by estimation, is 60 to 65%. The  left ventricle has normal function. The left ventricle has no regional  wall motion abnormalities. There is mild concentric left ventricular  hypertrophy. Left ventricular diastolic  parameters are consistent with Grade I diastolic dysfunction (impaired  relaxation). The average left ventricular global longitudinal strain is  22.7 %. The global longitudinal strain is normal.   2. Right ventricular systolic function is normal. The right ventricular  size is normal.   3. The mitral valve is normal in structure. Trivial mitral valve  regurgitation.   4. The aortic valve is normal in structure. Aortic valve regurgitation is  not visualized. Aortic valve sclerosis is present, with no evidence of  aortic valve stenosis.   ASSESSMENT  68 y.o. male with PMHx of arthritis, COPD, HLD and HTN who presented to the ED on Friday night after a fall. He did not hit his head but had a small cut to his right shin. He was noted to have  decreased sensation and motor function on his right side. BP was 204/104 BP with HR 77. Unknown last known well. The  patient was found outside lying on the ground by his sister. CT head in the ED revealed an acute left basal ganglia ICH, most likely hypertensive. He was started on clevidipine and admitted to the ICU. On neurological assessment today, he denies having had any headache in association with his acute onset right sided weakness. He denies any additional symptoms.  - Exam reveals right hemiplegia with mild right facial droop.  - Initial CT head (10/18 @ 0005): Acute left basal ganglia intraparenchymal hemorrhage with mild edema and minimal mass effect. - Follow up CT head (10/18 @ 0619): Stable acute intraparenchymal hemorrhage in left basal ganglia measuring 2.5 x 1.7 x 3.2 cm, with mild surrounding edema and local mass effect. Patchy white matter hypodensities compatible with chronic microvascular ischemic disease. - CTA of head and neck:  Negative CTA for large vessel occlusion or other emergent findings. No hemodynamically significant or correctable stenosis. No vascular abnormality seen underlying the acute left cerebral hemorrhage. 10 ml matched perfusion deficit within the left cerebral hemisphere, corresponding with the acute hemorrhage. No other perfusion abnormality. 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   RECOMMENDATIONS  - MRI brain - PT consult, OT consult, Speech consult - Cardiac telemetry - Frequent neuro checks - No indication for hypertonic saline at this time.  - Repeat CT head if he worsens.  - BP management with clevidipine drip  - No antiplatelet medications or anticoagulants - DVT prophylaxis with SCDs - Drug cessation counseling - CIWA protocol ______________________________________________________________________    Bonney SHARK, Fabiano Ginley, MD [facility]

## 2023-10-24 NOTE — Progress Notes (Signed)
  Echocardiogram 2D Echocardiogram has been performed.  Parker Brown 10/24/2023, 9:53 AM

## 2023-10-24 NOTE — Consult Note (Signed)
 [facility]rology Consult Services  Stat Consult  Patient Name:   Parker Brown, Parker Brown Date of Birth:   05/14/1955 Identification Number:   MRN - [id number] Date of Service:   10/24/2023 00:44:04  Diagnosis:       I61.0 - Intracerebral hemorrhage in hemisphere, subcortical  Impression 68 yo M who presents with right sided weakness. CT head shows small to small/moderate left basal ganglia hemorrhage. ICH in this location very likely to be related to elevated blood pressures and would explain patient's current deficits on exam. Would go forward with SBP goal 110-150 and admit to ICU for q1h neurochecks. Obtain stat [facility] for any change in exam and call our service stat as well. Full recommendations as follows:   Recommendations: Our recommendations are outlined below.  Medications : Hold antiplatelet therapy/NSAIDS/Anticoagulation  Nursing recommendations : Telemetry, IV Fluids. Avoid dextrose containing fluids, Maintain euglycemia. Head of bed 30 degrees Neuro checks q1-2 hrs during ICU stay  Consultations : Recommend Speech therapy if failed dysphagia screen Physical therapy/Occupational therapy  DVT Prophylaxis : SCDs  Disposition : Neurology will follow Additional Recommendations: -SBP goal 110-150  -INR/PTT/CBC largely wnl  -lipid panel and A1c  -stat [facility] for any change in exam  -with bleed being in hypertensive location, would be ok without MRI brain at this point but will defer to team tomorrow on whether this is ultimately wanted  --PT/OT/ST consults - inpatient rehab if recommended  -call neurology immediately with significant change in exam    ICH Score: 0 GlasGow Coma Score: 13-15 (0) Age >= 80: No (0) ICH volume >= 30mL: No (0) Intraventricular hemorrhage: No (0) Infratentorial origin of hemorrhage: No  (0)   ----------------------------------------------------------------------------------------------------    Metrics: Dispatch Time: 10/24/2023 00:41:14 Callback Response Time: 10/24/2023 00:53:11  Primary Provider Notified of Diagnostic Impression and Management Plan on: 10/24/2023 01:23:54   CT HEAD: I personally reviewed all the CT images that were available to me and it showed: CT head shows small to small/moderate left basal ganglia hemorrhage    ----------------------------------------------------------------------------------------------------  Chief Complaint: right sided weaknes  History of Present Illness: Patient is a 68 year old Male. 68 yo M who presents with right sided weakness.  Patient states he got suddenly weak on his right side at about 7 pm but not exactly sure on time (maybe closer to 8 pm). Weakness worst in right leg. Denies fall. States he was going to neighbor's house when he suddenly got weak. Also numb on right side. Speech and vision is normal per patient. Denies headache.    Past Medical History:      Hypertension      There is no history of Diabetes Mellitus      There is no history of Atrial Fibrillation      There is no history of Stroke Other PMH:  tobacco abuse  Medications:  No Anticoagulant use  No Antiplatelet use Reviewed EMR for current medications  Allergies:  Reviewed  Social History: Smoking: Yes  Family History:  There Is Family History Of: mother with hx of stroke  ROS : 14 Points Review of Systems was performed and was negative except mentioned in HPI.      Examination: BP(165/102), Pulse(77), 1A: Level of Consciousness - Alert; keenly responsive + 0 1B: Ask Month and Age - Both Questions Right + 0 1C: Blink Eyes & Squeeze Hands - Performs Both Tasks + 0 2: Test Horizontal Extraocular Movements - Normal + 0 3: Test Visual Fields - No Visual  Loss + 0 4: Test Facial Palsy (Use Grimace if Obtunded) - Normal  symmetry + 0 5A: Test Left Arm Motor Drift - No Drift for 10 Seconds + 0 5B: Test Right Arm Motor Drift - Drift, hits bed + 2 6A: Test Left Leg Motor Drift - No Drift for 5 Seconds + 0 6B: Test Right Leg Motor Drift - Drift, but doesn't hit bed + 1 7: Test Limb Ataxia (FNF/Heel-Shin) - Ataxia in 1 Limb + 1 8: Test Sensation - Mild-Moderate Loss: Less Sharp/More Dull + 1 9: Test Language/Aphasia - Normal; No aphasia + 0 10: Test Dysarthria - Mild-Moderate Dysarthria: Slurring but can be understood + 1 11: Test Extinction/Inattention - No abnormality + 0  NIHSS Score: 6 NIHSS Free Text : speech sounds slurred on my exam but he states his speech is at baseline; trouble with sensation on right  Spoke with : ED provider Artist    This consult was conducted in real time using interactive audio and Immunologist. Patient was informed of the technology being used for this visit and agreed to proceed. Patient located in hospital and provider located at home/office setting.  Patient is being evaluated for possible acute neurologic impairment and high probability of imminent or life - threatening deterioration.I spent total of 30 minutes providing care to this patient, including time for face to face visit via telemedicine, review of medical records, imaging studies and discussion of findings with providers, the patient and / or family.   Dr Garnette Medicine     [facility] For Inpatient follow-up with [facility] physician please call [facility] at [phone]. As we are not an outpatient service for any post hospital discharge needs please contact the hospital for assistance.  If you have any questions for the [facility] physicians or need to reconsult for clinical or diagnostic changes please contact us  via [facility] at [phone].   Signature : Garnette Medicine MD

## 2023-10-24 NOTE — H&P (Addendum)
 NAME:  Parker Brown, MRN:  [id number], DOB:  12-Apr-1955, LOS: 0 ADMISSION DATE:  10/23/2023, CONSULTATION DATE: 10/24/2023 REFERRING MD: Adine Birmingham, CHIEF COMPLAINT: Right-sided weakness  HPI  68 y.o male with significant PMHx of COPD, polysubstance abuse (cocaine, marijuana, current everyday smoker), HTN, HLD, and arthritis who presented to the ED with chief complaints of right-sided weakness and a fall.  Patient reports acute onset of right sided weakness which caused him to fall while walking to a neighbor's house.  He denied hitting his head, LOC or any other associated symptoms.  Patient admitted to smoking and using cocaine prior to presenting to the ED.  On EMS arrival patient was noted with blood pressure of 204/104.  He has history of hypertension however he admits to not taking his BP medicine on a regular basis   ED Course: Initial vital signs showed HR 67 of beats/minute, BP 186/112 - 214/130 mm Hg, the RR 16 breaths/minute, 98%O2 Sat on RA and Temp of 98.3F (36.7C).   Pertinent Labs/Diagnostics Findings: Unremarkable CBC and CMP.  UDS positive.  Stat noncontrast CT was obtained which showed acute left basal ganglia intraparenchymal hemorrhage with mild edema and minimal mass effect.  Patient was evaluated by teleneurologist who ordered additional imaging with recommendations as noted.  Follow-up CTA head and neck showed no acute intracranial abnormality or LVO.    [facility].  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  Consults:  Neurology  Procedures:  None  Interim History / Subjective:      Micro Data:  10/18: MRSA PCR>>    Antimicrobials:  none  OBJECTIVE   Blood pressure (!) 141/97, pulse 78, temperature 98.1 F (36.7 C), temperature source Oral, resp. rate 16, height 5' 5 (1.651 m), SpO2 100%.        Intake/Output Summary (Last 24 hours) at 10/24/2023 0259 Last data filed at 10/24/2023 0201 Gross per 24 hour  Intake 13.44 ml  Output --  Net 13.44 ml   There were no vitals filed for this visit.  Physical Examination  GEN: Critically ill patient, WDWN in NAD HEENT: [place]/AT. PERRL, sclerae anicteric. HEART: regular rhythm, normal rate, S1, S2, no M/R/G,  LUNGS: CTAB, mild wheezes, no increased WOB,  EXTREMITIES: No Edema, cap refill  NEURO:Left sided weakness with persistent pronator drift, mild dysarthria and left-sided dysmetria PSYCH:  Mood and Affect: Mood normal.  ABDOMINAL: Soft: BS x 4, NTND SKIN: Intact, warm, no rashes lesion, or ulcer  Labs/imaging that I havepersonally reviewed  (right click and Reselect all SmartList Selections daily)   EKG Date reviewed by Almarie Nose NP: 10/24/2023 EKG Time: 24-Oct-2023 00:26:08  Rate: 68 Rhythm: NSR QRS Axis: normal Intervals: normal ST/T Wave abnormalities: normal Narrative Interpretation: no evidence of acute ischemia  CT ANGIO HEAD NECK W WO CM W PERF (CODE STROKE) Result Date: 10/24/2023 EXAM: CTA Head and Neck with Perfusion 10/24/2023 01:41:45 AM TECHNIQUE: CTA of the head and neck was performed without and with the administration of 125 mL of iohexol (OMNIPAQUE) 350 MG/ML injection. 3D postprocessing with multiplanar reconstructions and MIPs was performed to evaluate the vascular anatomy. Cerebral perfusion analysis using computed tomography with contrast administration, including post-processing of parametric maps with determination of cerebral blood flow, cerebral blood volume,  mean transit time and time-to-maximum. Automated exposure control, iterative reconstruction, and/or weight based adjustment of the mA/kV was utilized to reduce the radiation dose to as low as  reasonably achievable. COMPARISON: CT from earlier the same day. CLINICAL HISTORY: Neuro deficit, acute, stroke suspected. FINDINGS: CTA NECK: AORTIC ARCH AND ARCH VESSELS: No dissection or arterial injury. No significant stenosis of the brachiocephalic or subclavian arteries. CERVICAL CAROTID ARTERIES: No dissection, arterial injury, or hemodynamically significant stenosis by NASCET criteria. CERVICAL VERTEBRAL ARTERIES: No dissection, arterial injury, or significant stenosis. LUNGS AND MEDIASTINUM: Unremarkable. SOFT TISSUES: 1.5 cm subcutaneous lipoma noted at the subcutaneous fat of the left chin. BONES: No acute abnormality. CTA HEAD: ANTERIOR CIRCULATION: No significant stenosis of the internal carotid arteries. No significant stenosis of the anterior cerebral arteries. No significant stenosis of the middle cerebral arteries. No aneurysm. POSTERIOR CIRCULATION: No significant stenosis of the posterior cerebral arteries. No significant stenosis of the basilar artery. No significant stenosis of the vertebral arteries. No aneurysm. OTHER: No vascular abnormality seen underlying the acute left cerebral hemorrhage. No spot sign. No dural venous sinus thrombosis on this non-dedicated study. CT PERFUSION: EXAM QUALITY: Exam quality is adequate with diagnostic perfusion maps. No significant motion artifact. Appropriate arterial inflow and venous outflow curves. CORE INFARCT (CBF<30% volume): 10 mL TOTAL HYPOPERFUSION (Tmax>6s volume): 8.  mL PENUMBRA: Mismatch volume: -2 mL Mismatch ratio: 0.8 Location:  matched perfusion deficit at the left basal ganglia/external capsule, corresponding with the acute left cerebral hemorrhage. No other perfusion abnormality. IMPRESSION: 1. Negative CTA for large vessel occlusion or other emergent findings. No hemodynamically significant or correctable stenosis. 2. No vascular abnormality seen underlying the acute left cerebral hemorrhage. 3. 10 ml matched perfusion deficit within the  left cerebral hemisphere, corresponding with the acute hemorrhage. No other perfusion abnormality. Electronically signed by: Morene Hoard MD 10/24/2023 02:08 AM EDT RP Workstation: [id number]   CT CERVICAL SPINE WO CONTRAST Result Date: 10/24/2023 EXAM: CT CERVICAL SPINE WITHOUT CONTRAST 10/24/2023 12:05:39 AM TECHNIQUE: CT of the cervical spine was performed without the administration of intravenous contrast. Multiplanar reformatted images are provided for review. Automated exposure control, iterative reconstruction, and/or weight based adjustment of the mA/kV was utilized to reduce the radiation dose to as low as reasonably achievable. COMPARISON: None available. CLINICAL HISTORY: Neuro deficit, acute, stroke suspected. The patient suffered an unwitnessed fall and was found outside on the ground by family. FINDINGS: CERVICAL SPINE: BONES AND ALIGNMENT: There is mild osteopenia. There is no evidence of fractures or traumatic malalignment. No focal pathologic bone lesions. There is a 2 mm grade 1 C5-C6 retrolisthesis likely due to discogenic degenerative arthrosis. Similar trace C2-C3 retrolisthesis. DEGENERATIVE CHANGES: There is degenerative disc space loss greatest at C2-C3, C6-C7, and C7-T1, mild disc space loss at C5-C6, normal C3-C4 and C4-C5 disc heights. There are small anterior endplate osteophytes at C2-C3, C5-C6, C6-C7, and C7-T1 but no significant posterior osteophytes. No herniated discs or cord compromise are seen. There is mild facet joint spurring at most levels, but no levels show significant foraminal encroachment. SOFT TISSUES: No prevertebral soft tissue swelling. There is multivocal advanced tooth decay on limited imaging through the posterior oral cavity. Follow up with a dentist is recommended. No thyroid or laryngeal mass is seen. There is asymmetric calcification at the left carotid bifurcation. . Scarring changes at both lung apices but no apical pneumothorax. IMPRESSION: 1. No  acute abnormality of the cervical spine. 2. Osteopenia and degenerative change. 3. Multifocal advanced tooth decay; dental follow-up is  recommended. 4. Asymmetric calcification at the left carotid bifurcation. Electronically signed by: Francis Quam MD 10/24/2023 12:35 AM EDT RP Workstation: [id number]   CT HEAD WO CONTRAST ( ) Result Date: 10/24/2023 EXAM: CT HEAD WITHOUT CONTRAST 10/24/2023 12:05:39 AM TECHNIQUE: CT of the head was performed without the administration of intravenous contrast. Automated exposure control, iterative reconstruction, and/or weight based adjustment of the mA/kV was utilized to reduce the radiation dose to as low as reasonably achievable. COMPARISON: None available. CLINICAL HISTORY: Neuro deficit, acute, stroke suspected. Patient presented to the ED from home via [facility] for a fall. Denies LOC. Patient did strike his head. Patient has a small cut to the right shin. Patient is obviously intoxicated. Patient has decreased sensation and motor function on the right side. BP 204/104. Has not taken medications. HR 77. Unknown last known well. Patient was found outside lying on the ground by his sister. FINDINGS: BRAIN AND VENTRICLES: Acute 2.4 x 1.5 x 3.0 cm hemorrhage within left basal ganglia with mild surrounding edema. Patchy white matter hypodensities, compatible with chronic microvascular ischemic disease. Minimal adjacent mass effect. No midline shift. No hydrocephalus. No extra-axial collection. No evidence of acute infarct. ORBITS: No acute abnormality. SINUSES: No acute abnormality. SOFT TISSUES AND SKULL: No acute soft tissue abnormality. No skull fracture. IMPRESSION: 1. Acute left basal ganglia intraparenchymal hemorrhage with mild edema and minimal mass effect. 2. CRITICAL VALUE/EMERGENT RESULTS WERE CALLED BY TELEPHONE AT THE TIME OF INTERPRETATION ON 10 / 18 / 25 at 12:12 am to Dr. Jossie, WHO VERBALLY ACKNOWLEDGED THESE RESULTS. Electronically signed by: Norman Gatlin  MD 10/24/2023 12:19 AM EDT RP Workstation: [id number]    Labs   CBC: Recent Labs  Lab 10/23/23 2345  WBC 3.8*  NEUTROABS 2.1  HGB 13.3  HCT 40.5  MCV 90.0  PLT 253   Basic Metabolic Panel: Recent Labs  Lab 10/23/23 2345  NA 137  K 3.5  CL 102  CO2 23  GLUCOSE 91  BUN 13  CREATININE 1.08  CALCIUM 9.4   GFR: CrCl cannot be calculated (Unknown ideal weight.). Recent Labs  Lab 10/23/23 2345  WBC 3.8*    Liver Function Tests: Recent Labs  Lab 10/23/23 2345  AST 26  ALT 10  ALKPHOS 50  BILITOT 0.8  PROT 8.5*  ALBUMIN 4.4   No results for input(s): LIPASE, AMYLASE in the last 168 hours. No results for input(s): AMMONIA in the last 168 hours.  ABG    Component Value Date/Time   HCO3 28.5 (H) 10/19/2022 1221   ACIDBASEDEF 0.4 02/18/2018 0205   O2SAT 54.8 10/19/2022 1221     Coagulation Profile: Recent Labs  Lab 10/23/23 2345  INR 1.0    Cardiac Enzymes: No results for input(s): CKTOTAL, CKMB, CKMBINDEX, TROPONINI in the last 168 hours.  HbA1C: No results found for: HGBA1C  CBG: Recent Labs  Lab 10/23/23 2343 10/24/23 0255  GLUCAP 86 89   Review of Systems:   Constitutional: Negative.   HENT: Negative.    Eyes: Negative.   Respiratory: Negative.    Cardiovascular: Negative.   Gastrointestinal: Negative.   Genitourinary: Negative.   Musculoskeletal:  Positive for joint pain.  Skin: Negative.   Neurological:  Positive for sensory change, speech change, focal weakness and weakness.  Endo/Heme/Allergies: Negative.   Psychiatric/Behavioral: Negative.     Past Medical History  He,  has a past medical history of Arthritis, COPD (chronic obstructive pulmonary disease) (HCC), Hyperlipidemia, and Hypertension.   Surgical History    Past  Surgical History:  Procedure Laterality Date   NO PAST SURGERIES      Social History   reports that he has been smoking cigarettes. He has never used smokeless tobacco. He reports current  alcohol use. He reports current drug use. Drugs: Marijuana and Crack cocaine.   Family History   His family history is not on file.   Allergies Allergies  Allergen Reactions   Benzoyl Peroxide Anaphylaxis   Fish Oil Anaphylaxis   Methocarbamol Anaphylaxis   Pineapple Anaphylaxis   Carbomer    Tuberculin, Ppd     Other Reaction(s): Eruption of skin   Tuberculin Purified Protein Derivative Hives and Rash   Home Medications  Prior to Admission medications   Medication Sig Start Date End Date Taking? Authorizing Provider  acetaminophen  (TYLENOL ) 325 MG tablet Take 2 tablets (650 mg total) by mouth every 6 (six) hours as needed for mild pain (pain score 1-3), fever or headache. 06/12/23   Von Bellis, MD  albuterol  (VENTOLIN  HFA) 108 [phone] Base) MCG/ACT inhaler Inhale 2 puffs into the lungs every 6 (six) hours as needed for wheezing or shortness of breath. 06/12/23   Von Bellis, MD  amLODipine  (NORVASC ) 10 MG tablet Take 1 tablet (10 mg total) by mouth daily. Hold if SBP <130 06/12/23 06/11/24  Von Bellis, MD  metoprolol  tartrate (LOPRESSOR ) 25 MG tablet Take 1 tablet (25 mg total) by mouth 2 (two) times daily. Hold if SBP <130 and or HR <65 06/12/23 06/11/24  Von Bellis, MD  polyethylene glycol (MIRALAX  / GLYCOLAX ) 17 g packet Take 17 g by mouth daily as needed for mild constipation. 10/20/22   Dorinda Drue DASEN, MD  Scheduled Meds:  [START ON 10/25/2023]  stroke: early stages of recovery book   Does not apply Once   Chlorhexidine Gluconate Cloth  6 each Topical Daily   pantoprazole (PROTONIX) IV  40 mg Intravenous QHS   senna-docusate  1 tablet Oral BID   sodium chloride  flush  3 mL Intravenous Once   Continuous Infusions:  clevidipine 8 mg/hr (10/24/23 0312)   PRN Meds:.acetaminophen  **OR** acetaminophen  (TYLENOL ) oral liquid 160 mg/5 mL **OR** acetaminophen , docusate sodium , polyethylene glycol   Active Hospital Problem list   See systems below  Assessment & Plan:  #Acute Left  Basal Ganglia Intraparenchymal Hemorrhage likely iso Cocaine Induced Hypertensive Emergency Presented with acute right-sided weakness and resultant fall UDS+ cocaine and marijuana CTA neg LVO or vascular abnormality seen underlying the acute left cerebral hemorrhage.  -Cleviprex Gtt to keep Goal SBP < 150  -HOB >= 30 degrees with aspiration precautions -Seizure precautions -Q1 neuro checks  -Keep platelets >100k, INR<1.4, Goal Na>145  -Pain control -MRI ICH protocol when stable -Repeat CT head in 6 hours (or sooner if clinical worsening). -Maintain strict euglycemia, euthermia, and euvolemia  -Hold antiplatelets and anticoagulation for now. -Strict I/Os. -Obtain Echocardiogram. -Neurology consult  #Hypertensive Emergency  HTNHLD Patient presenting with severe elevation of BP 214/113 with evidence of new target organ damage. Suspect iso Cocaine use and medication noncompliance -Check troponin -Cleviprex drip goal of SBP <150 mm Hg -Hold home Hypertensive meds  #Polysubstance Abuse (EtOH, tobacco, marijuana and cocaine) UDS + for cocaine and marijuana Ethanol levels <15 -High risk for alcohol withdrawal -Folic acid, thiamine, and mvi  -CIWA protocol  -Once clinical status improves will need polysubstance abuse cessation counseling  #COPD without evidence of acute exacerbation -Supplemental O2 as needed to maintain O2 saturations 88 to 92% -scheduled and PRN Bronchodilators -Encourage smoking cessation  Best practice:  Diet:  Oral Pain/Anxiety/Delirium protocol (if indicated): No VAP protocol (if indicated): Not indicated DVT prophylaxis: Contraindicated GI prophylaxis: PPI Glucose control:  SSI No Central venous access:  N/A Arterial line:  N/A Foley:  N/A Mobility:  bed rest  PT/OT consulted: Yes Code Status:  full code Disposition: ICU   = Goals of Care =  Primary Emergency Contact: Goodwill,ernistine   Critical care time: 55 minutes         Almarie Nose DNP, CCRN, FNP-C, AGACNP-BC Acute Care & Family Nurse Practitioner  Pulmonary & Critical Care Medicine PCCM on call pager [phone]

## 2023-10-24 NOTE — ED Provider Notes (Signed)
 [facility] Provider Note   Event Date/Time   First MD Initiated Contact with Patient 10/24/23 0013     (approximate) History  Fall  HPI Parker Brown is a 68 y.o. male with a stated past medical history of hypertension and polysubstance abuse who presents complaining of right sided weakness and a fall.  Patient states that approximate 1 hour prior to arrival he began feeling acute weakness in the right upper and lower extremity that caused him to fall while walking to a neighbor's house.  Patient denies any head trauma during this fall.  Patient states that he has had persistent right upper and lower extremity weakness since then.  Patient also states that he has not taken any medications for his blood pressure and EMS did note that he was 204/104 on their arrival.  Patient also admitted to cocaine and alcohol use ROS: Patient currently denies any vision changes, tinnitus, difficulty speaking, facial droop, sore throat, chest pain, shortness of breath, abdominal pain, nausea/vomiting/diarrhea, dysuria, or numbness/paresthesias in any extremity   Physical Exam  Triage Vital Signs: ED Triage Vitals [10/23/23 2342]  Encounter Vitals Group     BP (!) 186/112     Girls Systolic BP Percentile      Girls Diastolic BP Percentile      Boys Systolic BP Percentile      Boys Diastolic BP Percentile      Pulse Rate 67     Resp 16     Temp 98.1 F (36.7 C)     Temp Source Oral     SpO2 98 %     Weight      Height 5' 5 (1.651 m)     Head Circumference      Peak Flow      Pain Score 0     Pain Loc      Pain Education      Exclude from Growth Chart    Most recent vital signs: Vitals:   10/24/23 0130 10/24/23 0200  BP: (!) 160/101 (!) 141/97  Pulse: 80 78  Resp: 15 16  Temp:    SpO2: 100% 100%   General: Awake, oriented x4. CV:  Good peripheral perfusion. Resp:  Normal effort. Abd:  No distention. Other:  Patient is an elderly well-developed,  well-nourished African-American male resting comfortably in no acute distress.  Muscular weakness in the right upper and lower extremity ED Results / Procedures / Treatments  Labs (all labs ordered are listed, but only abnormal results are displayed) Labs Reviewed  CBC - Abnormal; Notable for the following components:      Result Value   WBC 3.8 (*)    All other components within normal limits  COMPREHENSIVE METABOLIC PANEL WITH GFR - Abnormal; Notable for the following components:   Total Protein 8.5 (*)    All other components within normal limits  MRSA NEXT GEN BY PCR, NASAL  PROTIME-INR  APTT  DIFFERENTIAL  ETHANOL  URINE DRUG SCREEN, QUALITATIVE ([facility] ONLY)  CBG MONITORING, ED   EKG ED ECG REPORT I, Artist MARLA Kerns, the attending physician, personally viewed and interpreted this ECG. Date: 10/24/2023 EKG Time: 2349 Rate: 66 Rhythm: normal sinus rhythm QRS Axis: normal Intervals: normal ST/T Wave abnormalities: normal Narrative Interpretation: no evidence of acute ischemia RADIOLOGY ED MD interpretation: CT of the head without contrast shows acute left basal ganglia intraparenchymal hemorrhage with mild edema and minimal mass effect - All radiology independently interpreted and agree with  radiology assessment Official radiology report(s): CT ANGIO HEAD NECK W WO CM W PERF (CODE STROKE) Result Date: 10/24/2023 EXAM: CTA Head and Neck with Perfusion 10/24/2023 01:41:45 AM TECHNIQUE: CTA of the head and neck was performed without and with the administration of 125 mL of iohexol (OMNIPAQUE) 350 MG/ML injection. 3D postprocessing with multiplanar reconstructions and MIPs was performed to evaluate the vascular anatomy. Cerebral perfusion analysis using computed tomography with contrast administration, including post-processing of parametric maps with determination of cerebral blood flow, cerebral blood volume, mean transit time and time-to-maximum. Automated exposure control,  iterative reconstruction, and/or weight based adjustment of the mA/kV was utilized to reduce the radiation dose to as low as reasonably achievable. COMPARISON: CT from earlier the same day. CLINICAL HISTORY: Neuro deficit, acute, stroke suspected. FINDINGS: CTA NECK: AORTIC ARCH AND ARCH VESSELS: No dissection or arterial injury. No significant stenosis of the brachiocephalic or subclavian arteries. CERVICAL CAROTID ARTERIES: No dissection, arterial injury, or hemodynamically significant stenosis by NASCET criteria. CERVICAL VERTEBRAL ARTERIES: No dissection, arterial injury, or significant stenosis. LUNGS AND MEDIASTINUM: Unremarkable. SOFT TISSUES: 1.5 cm subcutaneous lipoma noted at the subcutaneous fat of the left chin. BONES: No acute abnormality. CTA HEAD: ANTERIOR CIRCULATION: No significant stenosis of the internal carotid arteries. No significant stenosis of the anterior cerebral arteries. No significant stenosis of the middle cerebral arteries. No aneurysm. POSTERIOR CIRCULATION: No significant stenosis of the posterior cerebral arteries. No significant stenosis of the basilar artery. No significant stenosis of the vertebral arteries. No aneurysm. OTHER: No vascular abnormality seen underlying the acute left cerebral hemorrhage. No spot sign. No dural venous sinus thrombosis on this non-dedicated study. CT PERFUSION: EXAM QUALITY: Exam quality is adequate with diagnostic perfusion maps. No significant motion artifact. Appropriate arterial inflow and venous outflow curves. CORE INFARCT (CBF<30% volume): 10 mL TOTAL HYPOPERFUSION (Tmax>6s volume): 8.  mL PENUMBRA: Mismatch volume: -2 mL Mismatch ratio: 0.8 Location:  matched perfusion deficit at the left basal ganglia/external capsule, corresponding with the acute left cerebral hemorrhage. No other perfusion abnormality. IMPRESSION: 1. Negative CTA for large vessel occlusion or other emergent findings. No hemodynamically significant or correctable stenosis.  2. No vascular abnormality seen underlying the acute left cerebral hemorrhage. 3. 10 ml matched perfusion deficit within the left cerebral hemisphere, corresponding with the acute hemorrhage. No other perfusion abnormality. Electronically signed by: Morene Hoard MD 10/24/2023 02:08 AM EDT RP Workstation: [id number]   CT CERVICAL SPINE WO CONTRAST Result Date: 10/24/2023 EXAM: CT CERVICAL SPINE WITHOUT CONTRAST 10/24/2023 12:05:39 AM TECHNIQUE: CT of the cervical spine was performed without the administration of intravenous contrast. Multiplanar reformatted images are provided for review. Automated exposure control, iterative reconstruction, and/or weight based adjustment of the mA/kV was utilized to reduce the radiation dose to as low as reasonably achievable. COMPARISON: None available. CLINICAL HISTORY: Neuro deficit, acute, stroke suspected. The patient suffered an unwitnessed fall and was found outside on the ground by family. FINDINGS: CERVICAL SPINE: BONES AND ALIGNMENT: There is mild osteopenia. There is no evidence of fractures or traumatic malalignment. No focal pathologic bone lesions. There is a 2 mm grade 1 C5-C6 retrolisthesis likely due to discogenic degenerative arthrosis. Similar trace C2-C3 retrolisthesis. DEGENERATIVE CHANGES: There is degenerative disc space loss greatest at C2-C3, C6-C7, and C7-T1, mild disc space loss at C5-C6, normal C3-C4 and C4-C5 disc heights. There are small anterior endplate osteophytes at C2-C3, C5-C6, C6-C7, and C7-T1 but no significant posterior osteophytes. No herniated discs or cord compromise are seen. There is mild facet joint  spurring at most levels, but no levels show significant foraminal encroachment. SOFT TISSUES: No prevertebral soft tissue swelling. There is multivocal advanced tooth decay on limited imaging through the posterior oral cavity. Follow up with a dentist is recommended. No thyroid or laryngeal mass is seen. There is asymmetric  calcification at the left carotid bifurcation. . Scarring changes at both lung apices but no apical pneumothorax. IMPRESSION: 1. No acute abnormality of the cervical spine. 2. Osteopenia and degenerative change. 3. Multifocal advanced tooth decay; dental follow-up is recommended. 4. Asymmetric calcification at the left carotid bifurcation. Electronically signed by: Francis Quam MD 10/24/2023 12:35 AM EDT RP Workstation: [id number]   CT HEAD WO CONTRAST ( ) Result Date: 10/24/2023 EXAM: CT HEAD WITHOUT CONTRAST 10/24/2023 12:05:39 AM TECHNIQUE: CT of the head was performed without the administration of intravenous contrast. Automated exposure control, iterative reconstruction, and/or weight based adjustment of the mA/kV was utilized to reduce the radiation dose to as low as reasonably achievable. COMPARISON: None available. CLINICAL HISTORY: Neuro deficit, acute, stroke suspected. Patient presented to the ED from home via [facility] for a fall. Denies LOC. Patient did strike his head. Patient has a small cut to the right shin. Patient is obviously intoxicated. Patient has decreased sensation and motor function on the right side. BP 204/104. Has not taken medications. HR 77. Unknown last known well. Patient was found outside lying on the ground by his sister. FINDINGS: BRAIN AND VENTRICLES: Acute 2.4 x 1.5 x 3.0 cm hemorrhage within left basal ganglia with mild surrounding edema. Patchy white matter hypodensities, compatible with chronic microvascular ischemic disease. Minimal adjacent mass effect. No midline shift. No hydrocephalus. No extra-axial collection. No evidence of acute infarct. ORBITS: No acute abnormality. SINUSES: No acute abnormality. SOFT TISSUES AND SKULL: No acute soft tissue abnormality. No skull fracture. IMPRESSION: 1. Acute left basal ganglia intraparenchymal hemorrhage with mild edema and minimal mass effect. 2. CRITICAL VALUE/EMERGENT RESULTS WERE CALLED BY TELEPHONE AT THE TIME OF  INTERPRETATION ON 10 / 18 / 25 at 12:12 am to Dr. Jossie, WHO VERBALLY ACKNOWLEDGED THESE RESULTS. Electronically signed by: Norman Gatlin MD 10/24/2023 12:19 AM EDT RP Workstation: [id number]   PROCEDURES: Critical Care performed: Yes, see critical care procedure note(s) .1-3 Lead EKG Interpretation  Performed by: Jossie Artist POUR, MD Authorized by: Jossie Artist POUR, MD     Interpretation: normal     ECG rate:  71   ECG rate assessment: normal     Rhythm: sinus rhythm     Ectopy: none     Conduction: normal    CRITICAL CARE Performed by: Jaison Petraglia K Mckala Pantaleon  Total critical care time: 41 minutes  Critical care time was exclusive of separately billable procedures and treating other patients.  Critical care was necessary to treat or prevent imminent or life-threatening deterioration.  Critical care was time spent personally by me on the following activities: development of treatment plan with patient and/or surrogate as well as nursing, discussions with consultants, evaluation of patient's response to treatment, examination of patient, obtaining history from patient or surrogate, ordering and performing treatments and interventions, ordering and review of laboratory studies, ordering and review of radiographic studies, pulse oximetry and re-evaluation of patient's condition.  MEDICATIONS ORDERED IN ED: Medications  sodium chloride  flush (NS) 0.9 % injection 3 mL (3 mLs Intravenous Not Given 10/23/23 2347)  clevidipine (CLEVIPREX) infusion 0.5 mg/mL (8 mg/hr Intravenous Infusion Verify 10/24/23 0201)  Chlorhexidine Gluconate Cloth 2 % PADS 6 each (has no administration in time range)  docusate sodium  (COLACE) capsule 100 mg (has no administration in time range)  polyethylene glycol (MIRALAX  / GLYCOLAX ) packet 17 g (has no administration in time range)   stroke: early stages of recovery book (has no administration in time range)  acetaminophen  (TYLENOL ) tablet 650 mg (has no administration  in time range)    Or  acetaminophen  (TYLENOL ) 160 MG/5ML solution 650 mg (has no administration in time range)    Or  acetaminophen  (TYLENOL ) suppository 650 mg (has no administration in time range)  senna-docusate (Senokot-S) tablet 1 tablet (1 tablet Oral Given 10/24/23 0210)  pantoprazole (PROTONIX) injection 40 mg (40 mg Intravenous Given 10/24/23 0210)  iohexol (OMNIPAQUE) 350 MG/ML injection 125 mL (125 mLs Intravenous Contrast Given 10/24/23 0128)   IMPRESSION / MDM / ASSESSMENT AND PLAN / ED COURSE  I reviewed the triage vital signs and the nursing notes.                             The patient is on the cardiac monitor to evaluate for evidence of arrhythmia and/or significant heart rate changes. Patient's presentation is most consistent with acute presentation with potential threat to life or bodily function. Patient is a 68 year old male with the above-stated past medical history who presents after a fall. + neurological deficits from baseline including right upper and lower extremity weakness + elevated blood pressures to 204/104  Unlikely infectious etiology or changes secondary to ingestion  Workup: POCT glucose. CBC, BMP, PT/INR, PTT, troponin, type and screen ECG and non-contrast head CT  Findings: CT Brain: intracranial hemorrhage in the left basal ganglia ECG: No cerebral T waves. No STEMI  Interventions: BP Control: PRN Cleviprex   Consults: Neurology, ICU  Dispo: Admit to the ICU   FINAL CLINICAL IMPRESSION(S) / ED DIAGNOSES   Final diagnoses:  Intraparenchymal hemorrhage of brain (HCC)  Weakness of right upper extremity  Weakness of right lower extremity   Rx / DC Orders   ED Discharge Orders     None      Note:  This document was prepared using Dragon voice recognition software and may include unintentional dictation errors.   Hargis Vandyne K, MD 10/24/23 [phone]

## 2023-10-24 NOTE — Consult Note (Signed)
 PHARMACY CONSULT NOTE - ELECTROLYTES  Pharmacy Consult for Electrolyte Monitoring and Replacement   Recent Labs: Height: 5' 5 (165.1 cm) Weight: 62.9 kg (138 lb 10.7 oz) IBW/kg (Calculated) : 61.5 Estimated Creatinine Clearance: 55.4 mL/min (by C-G formula based on SCr of 1.08 mg/dL). Potassium (mmol/L)  Date Value  10/23/2023 3.5   Magnesium (mg/dL)  Date Value  93/93/7974 2.1   Calcium (mg/dL)  Date Value  89/82/7974 9.4   Albumin (g/dL)  Date Value  89/82/7974 4.4   Phosphorus (mg/dL)  Date Value  93/93/7974 4.0   Sodium (mmol/L)  Date Value  10/23/2023 137    Assessment  Parker Brown is a 68 y.o. male presenting with right-sided weakness and a fall. PMH significant for COPD, polysubstance abuse (cocaine, marijuana, current everyday smoker), HTN, HLD, and arthritis . Pharmacy has been consulted to monitor and replace electrolytes.  Diet: NPO Pertinent medications: clevidipine gtt  Goal of Therapy: Electrolytes WNL  Plan:  K 3.5: Kcl 10mEq IV x 2 Check BMP, Mg, Phos with AM labs  Thank you for allowing pharmacy to be a part of this patient's care.  Parker Brown, PharmD Clinical Pharmacist 10/24/2023 7:14 AM

## 2023-10-24 NOTE — Plan of Care (Signed)
  Problem: Education: Goal: Knowledge of disease or condition will improve Outcome: Progressing   Problem: Education: Goal: Knowledge of General Education information will improve Description: Including pain rating scale, medication(s)/side effects and non-pharmacologic comfort measures Outcome: Progressing   Problem: Clinical Measurements: Goal: Ability to maintain clinical measurements within normal limits will improve Outcome: Progressing Goal: Diagnostic test results will improve Outcome: Progressing

## 2023-10-25 ENCOUNTER — Inpatient Hospital Stay

## 2023-10-25 DIAGNOSIS — F1490 Cocaine use, unspecified, uncomplicated: Secondary | ICD-10-CM | POA: Diagnosis not present

## 2023-10-25 DIAGNOSIS — I161 Hypertensive emergency: Secondary | ICD-10-CM | POA: Diagnosis not present

## 2023-10-25 DIAGNOSIS — F10939 Alcohol use, unspecified with withdrawal, unspecified: Secondary | ICD-10-CM

## 2023-10-25 DIAGNOSIS — I10 Essential (primary) hypertension: Secondary | ICD-10-CM | POA: Diagnosis not present

## 2023-10-25 DIAGNOSIS — I610 Nontraumatic intracerebral hemorrhage in hemisphere, subcortical: Secondary | ICD-10-CM | POA: Diagnosis not present

## 2023-10-25 DIAGNOSIS — G936 Cerebral edema: Secondary | ICD-10-CM | POA: Diagnosis not present

## 2023-10-25 LAB — CBC WITH DIFFERENTIAL/PLATELET
Abs Immature Granulocytes: 0.01 K/uL (ref 0.00–0.07)
Basophils Absolute: 0 K/uL (ref 0.0–0.1)
Basophils Relative: 1 %
Eosinophils Absolute: 0.1 K/uL (ref 0.0–0.5)
Eosinophils Relative: 1 %
HCT: 44.7 % (ref 39.0–52.0)
Hemoglobin: 15.1 g/dL (ref 13.0–17.0)
Immature Granulocytes: 0 %
Lymphocytes Relative: 32 %
Lymphs Abs: 2 K/uL (ref 0.7–4.0)
MCH: 29.4 pg (ref 26.0–34.0)
MCHC: 33.8 g/dL (ref 30.0–36.0)
MCV: 87.1 fL (ref 80.0–100.0)
Monocytes Absolute: 0.5 K/uL (ref 0.1–1.0)
Monocytes Relative: 7 %
Neutro Abs: 3.7 K/uL (ref 1.7–7.7)
Neutrophils Relative %: 59 %
Platelets: 284 K/uL (ref 150–400)
RBC: 5.13 MIL/uL (ref 4.22–5.81)
RDW: 14.4 % (ref 11.5–15.5)
WBC: 6.3 K/uL (ref 4.0–10.5)
nRBC: 0 % (ref 0.0–0.2)

## 2023-10-25 LAB — GLUCOSE, CAPILLARY
Glucose-Capillary: 113 mg/dL — ABNORMAL HIGH (ref 70–99)
Glucose-Capillary: 114 mg/dL — ABNORMAL HIGH (ref 70–99)
Glucose-Capillary: 123 mg/dL — ABNORMAL HIGH (ref 70–99)
Glucose-Capillary: 135 mg/dL — ABNORMAL HIGH (ref 70–99)

## 2023-10-25 LAB — BASIC METABOLIC PANEL WITH GFR
Anion gap: 10 (ref 5–15)
BUN: 12 mg/dL (ref 8–23)
CO2: 26 mmol/L (ref 22–32)
Calcium: 9.2 mg/dL (ref 8.9–10.3)
Chloride: 101 mmol/L (ref 98–111)
Creatinine, Ser: 1.07 mg/dL (ref 0.61–1.24)
GFR, Estimated: >60 mL/min (ref >60)
Glucose, Bld: 106 mg/dL — ABNORMAL HIGH (ref 70–99)
Potassium: 3.2 mmol/L — ABNORMAL LOW (ref 3.5–5.1)
Sodium: 137 mmol/L (ref 135–145)

## 2023-10-25 LAB — TRIGLYCERIDES: Triglycerides: 155 mg/dL — ABNORMAL HIGH (ref <150)

## 2023-10-25 MED ORDER — LOSARTAN POTASSIUM 50 MG PO TABS
50.0000 mg | ORAL_TABLET | Freq: Every day | ORAL | Status: DC
  Administered 2023-10-25 – 2023-10-26 (×2): 50 mg via ORAL
  Filled 2023-10-25 (×2): qty 1

## 2023-10-25 MED ORDER — HYDRALAZINE HCL 50 MG PO TABS
25.0000 mg | ORAL_TABLET | Freq: Four times a day (QID) | ORAL | Status: DC
  Administered 2023-10-25 – 2023-10-27 (×9): 25 mg via ORAL
  Filled 2023-10-25 (×9): qty 1

## 2023-10-25 MED ORDER — INSULIN ASPART 100 UNIT/ML IJ SOLN
1.0000 [IU] | INTRAMUSCULAR | Status: DC
  Administered 2023-10-25 (×2): 1 [IU] via SUBCUTANEOUS
  Filled 2023-10-25: qty 1

## 2023-10-25 MED ORDER — THIAMINE MONONITRATE 100 MG PO TABS
100.0000 mg | ORAL_TABLET | Freq: Every day | ORAL | Status: DC
  Administered 2023-10-25 – 2023-11-10 (×16): 100 mg via ORAL
  Filled 2023-10-25 (×16): qty 1

## 2023-10-25 MED ORDER — ADULT MULTIVITAMIN W/MINERALS CH
1.0000 | ORAL_TABLET | Freq: Every day | ORAL | Status: DC
  Administered 2023-10-26 – 2023-11-10 (×16): 1 via ORAL
  Filled 2023-10-25 (×16): qty 1

## 2023-10-25 MED ORDER — LORAZEPAM 2 MG/ML IJ SOLN
1.0000 mg | Freq: Once | INTRAMUSCULAR | Status: AC
  Administered 2023-10-25: 1 mg via INTRAVENOUS
  Filled 2023-10-25: qty 1

## 2023-10-25 MED ORDER — THIAMINE HCL 100 MG/ML IJ SOLN
100.0000 mg | Freq: Every day | INTRAMUSCULAR | Status: DC
  Administered 2023-10-26 – 2023-11-03 (×2): 100 mg via INTRAVENOUS
  Filled 2023-10-25 (×5): qty 2

## 2023-10-25 MED ORDER — LORAZEPAM 2 MG/ML IJ SOLN
1.0000 mg | INTRAMUSCULAR | Status: AC | PRN
  Administered 2023-10-28: 1 mg via INTRAVENOUS
  Administered 2023-10-28: 2 mg via INTRAVENOUS
  Filled 2023-10-25 (×2): qty 1

## 2023-10-25 MED ORDER — LORAZEPAM 1 MG PO TABS
1.0000 mg | ORAL_TABLET | ORAL | Status: AC | PRN
  Administered 2023-10-25 – 2023-10-27 (×3): 1 mg via ORAL
  Administered 2023-10-28: 2 mg via ORAL
  Filled 2023-10-25: qty 2
  Filled 2023-10-25 (×2): qty 1
  Filled 2023-10-25: qty 2

## 2023-10-25 MED ORDER — POTASSIUM CHLORIDE CRYS ER 20 MEQ PO TBCR
40.0000 meq | EXTENDED_RELEASE_TABLET | ORAL | Status: AC
  Administered 2023-10-25 (×2): 40 meq via ORAL
  Filled 2023-10-25 (×2): qty 2

## 2023-10-25 MED ORDER — FOLIC ACID 1 MG PO TABS
1.0000 mg | ORAL_TABLET | Freq: Every day | ORAL | Status: DC
  Administered 2023-10-26 – 2023-11-10 (×16): 1 mg via ORAL
  Filled 2023-10-25 (×16): qty 1

## 2023-10-25 NOTE — Progress Notes (Signed)
 Inpatient Rehab Admissions Coordinator Note:   Per PT patient was screened for CIR candidacy by Halden Phegley SHAUNNA Yvone Cohens, CCC-SLP. At this time, pt appears to be a potential candidate for CIR. If pt would like to be considered, please place an IP Rehab MD consult order.    Tinnie Yvone Cohens, MS, CCC-SLP Admissions Coordinator [phone] 10/25/23 4:59 PM

## 2023-10-25 NOTE — Progress Notes (Signed)
 NEUROLOGY CONSULT FOLLOW UP NOTE   Date of service: October 25, 2023 Patient Name: Carlas Vandyne MRN:  [id number] DOB:  03-Dec-1955  Interval Hx/subjective  Has had worsening speech overnight.   Vitals   Vitals:   10/25/23 0945 10/25/23 1000 10/25/23 1015 10/25/23 1030  BP: (!) 151/89 (!) 148/94 (!) 150/93 (!) 142/94  Pulse: 83 81 77 85  Resp: 18 19 19 19   Temp:      TempSrc:      SpO2: 99% 98% 98% 100%  Weight:      Height:         Body mass index is 23.15 kg/m.  Physical Exam   Constitutional: Appears well-developed and well-nourished.  Psych: Blunted affect.  Eyes: No scleral injection.  HENT: No OP obstruction.  Head: Normocephalic.  Respiratory: Effort normal, non-labored breathing.  Skin: WDI.   Neurologic Examination   Mental Status: Lethargic. Speech is dysarthric, hypophonic and dysfluent, new deficits since yesterday. Cannot answer any orientation questions. Does not correctly name a thumb, his ear, nose or chin. Was able to ask examiner to turn lights off at the end of exam with hypophonic and dysarthric speech. Right sided neglect. Only able to follow some commands.  Cranial Nerves: II: Not able to cooperate with visual fields testing. PERRL. III,IV, VI: Does not visually track to command, but during exam eyes are conjugate with left sided gaze preference but able to cross midline to right.  V: Not able to cooperate with testing.  VII: Mild right facial droop.  VIII: Hearing intact to voice IX,X: Hypophonic speech XI: Tends to keep head turned to the left.  XII: Not following commands for assessment Motor: RUE: Decreased tone. No movement to command or light noxious.  LUE: Moves when asked, but not following commands for detailed strength assessment.  RLE: Stereotyped weak withdrawals to noxious plantar stimulation but no antigravity or spontaneous movement.  LLE: Moves when asked, but not following commands for detailed motor testing.   Sensory: Attends to left sided stimuli. With RUE pinch, moves left side in discomfort. With RLE noxious, will weakly withdraw. No gross sensory deficit on left.   Cerebellar: Not following commands for testing today. Gait: Unable to assess  Medications  Current Facility-Administered Medications:    acetaminophen  (TYLENOL ) tablet 650 mg, 650 mg, Oral, Q4H PRN **OR** acetaminophen  (TYLENOL ) 160 MG/5ML solution 650 mg, 650 mg, Per Tube, Q4H PRN **OR** acetaminophen  (TYLENOL ) suppository 650 mg, 650 mg, Rectal, Q4H PRN, Ouma, Almarie Bake, NP   amLODipine  (NORVASC ) tablet 10 mg, 10 mg, Oral, Daily, Dgayli, Khabib, MD, 10 mg at 10/25/23 0817   Chlorhexidine Gluconate Cloth 2 % PADS 6 each, 6 each, Topical, Daily, Ouma, Almarie Bake, NP, 6 each at 10/25/23 0818   clevidipine (CLEVIPREX) infusion 0.5 mg/mL, 0-21 mg/hr, Intravenous, Continuous, Bradler, Evan K, MD, Last Rate: 16 mL/hr at 10/25/23 0809, 8 mg/hr at 10/25/23 0809   docusate sodium  (COLACE) capsule 100 mg, 100 mg, Oral, BID PRN, Ouma, Elizabeth Achieng, NP   hydrALAZINE  (APRESOLINE ) injection 10 mg, 10 mg, Intravenous, Q4H PRN, Benjamin, Alzora L, NP   hydrALAZINE  (APRESOLINE ) tablet 25 mg, 25 mg, Oral, Q6H, Dgayli, Khabib, MD, 25 mg at 10/25/23 0817   insulin aspart (novoLOG) injection 1-3 Units, 1-3 Units, Subcutaneous, Q4H, Dgayli, Khabib, MD   losartan (COZAAR) tablet 50 mg, 50 mg, Oral, Daily, Dgayli, Khabib, MD   pantoprazole (PROTONIX) injection 40 mg, 40 mg, Intravenous, QHS, Ouma, Almarie Bake, NP, 40 mg at 10/24/23 2157  polyethylene glycol (MIRALAX  / GLYCOLAX ) packet 17 g, 17 g, Oral, Daily PRN, Ouma, Almarie Bake, NP   potassium chloride  SA (KLOR-CON  M) CR tablet 40 mEq, 40 mEq, Oral, Q4H, Nazari, Walid A, RPH, 40 mEq at 10/25/23 0817   senna-docusate (Senokot-S) tablet 1 tablet, 1 tablet, Oral, BID, Kathrene Almarie Bake, NP, 1 tablet at 10/25/23 0817  Labs and Diagnostic Imaging   CBC:  Recent Labs   Lab 10/23/23 2345 10/25/23 0426  WBC 3.8* 6.3  NEUTROABS 2.1 3.7  HGB 13.3 15.1  HCT 40.5 44.7  MCV 90.0 87.1  PLT 253 284    Basic Metabolic Panel:  Lab Results  Component Value Date   NA 137 10/25/2023   K 3.2 (L) 10/25/2023   CO2 26 10/25/2023   GLUCOSE 106 (H) 10/25/2023   BUN 12 10/25/2023   CREATININE 1.07 10/25/2023   CALCIUM 9.2 10/25/2023   GFRNONAA >60 10/25/2023   GFRAA >60 02/17/2018   Lipid Panel: No results found for: LDLCALC HgbA1c: No results found for: HGBA1C Urine Drug Screen:     Component Value Date/Time   LABOPIA NONE DETECTED 10/24/2023 0211   COCAINSCRNUR POSITIVE (A) 10/24/2023 0211   LABBENZ NONE DETECTED 10/24/2023 0211   AMPHETMU NONE DETECTED 10/24/2023 0211   THCU POSITIVE (A) 10/24/2023 0211   LABBARB NONE DETECTED 10/24/2023 0211    Alcohol Level     Component Value Date/Time   ETH <15 10/23/2023 2345   INR  Lab Results  Component Value Date   INR 1.0 10/23/2023   APTT  Lab Results  Component Value Date   APTT 31 10/23/2023   TTE: 1. Left ventricular ejection fraction, by estimation, is 60 to 65%. The  left ventricle has normal function. The left ventricle has no regional  wall motion abnormalities. There is mild concentric left ventricular  hypertrophy. Left ventricular diastolic  parameters are consistent with Grade I diastolic dysfunction (impaired  relaxation). The average left ventricular global longitudinal strain is  22.7 %. The global longitudinal strain is normal.   2. Right ventricular systolic function is normal. The right ventricular  size is normal.   3. The mitral valve is normal in structure. Trivial mitral valve  regurgitation.   4. The aortic valve is normal in structure. Aortic valve regurgitation is  not visualized. Aortic valve sclerosis is present, with no evidence of  aortic valve stenosis.   Assessment  68 y.o. male with PMHx of arthritis, COPD, HLD and HTN who presented to the ED on Friday  night after a fall. He did not hit his head but had a small cut to his right shin. He was noted to have  decreased sensation and motor function on his right side. BP was 204/104 BP with HR 77. Unknown last known well. The patient was found outside lying on the ground by his sister. CT head in the ED revealed an acute left basal ganglia ICH, most likely hypertensive. He was started on clevidipine and admitted to the ICU. On neurological assessment yesterday, he denied having had any headache in association with his acute onset right sided weakness. Overnight, however, he has developed a headache and lethargy.  - Exam reveals lethargy and difficulty following commands with minimal speech output today, relative his alert state with intact speech yesterday. Continues to have dense right sided weakness.  - CT head from this morning (Sunday): Stable intraparenchymal hemorrhage within the left basal ganglia and external capsule, measuring approximately 2.4 x 1.6 x 3.2 cm, with  mild surrounding edema. No evidence of interval hemorrhage. Mild periventricular white matter disease.  - CTA of head and neck:  Negative CTA for large vessel occlusion or other emergent findings. No hemodynamically significant or correctable stenosis. No vascular abnormality seen underlying the acute left cerebral hemorrhage. 10 ml matched perfusion deficit within the left cerebral hemisphere, corresponding with the acute hemorrhage. No other perfusion abnormality. 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  - Clinical worsening overnight. Will need to be evaluated with repeat imaging.    Recommendations  - Agree with expediting MRI brain.  - BP management with clevidipine drip  - No antiplatelet medications or anticoagulants - DVT prophylaxis with SCDs - Drug cessation counseling - PT consult, OT  consult, Speech consult when able - Continue cardiac telemetry  Addendum: - MRI brain: Acute hemorrhagic infarct involving the left centrum semiovale, left corona radiata, and external capsule with partial involvement of the posterior left lentiform nucleus. Similar appearance of intraparenchymal hematoma. Surrounding edema and mild local mass effect. No significant midline shift. Moderate chronic microvascular ischemic changes. - Given worsening cognition despite no change to the size of the hematoma or edema, DDx for worsening now includes possible subclinical seizure activity versus EtOH withdrawal.  - Will need placement of Ceribell EEG for now. I will order a 21-electrode conventional spot EEG for tomorrow.  - Trial of Ativan  1 mg IV x 1 now.  - CIWA protocol ______________________________________________________________________   Bonney SHARK, Denisha Hoel, MD [facility]

## 2023-10-25 NOTE — Progress Notes (Signed)
 NAME:  Parker Brown, MRN:  [id number], DOB:  05-17-1955, LOS: 1 ADMISSION DATE:  10/23/2023, CHIEF COMPLAINT:  Weakness   History of Present Illness:   68 y.o male with significant PMHx of COPD, polysubstance abuse (cocaine, marijuana, current everyday smoker), HTN, HLD, and arthritis who presented to the ED with chief complaints of right-sided weakness and a fall.   Patient reports acute onset of right sided weakness which caused him to fall while walking to a neighbor's house.  He denied hitting his head, LOC or any other associated symptoms.  Patient admitted to smoking and using cocaine prior to presenting to the ED.  On EMS arrival patient was noted with blood pressure of 204/104.  He has history of hypertension however he admits to not taking his BP medicine on a regular basis   ED Course: Initial vital signs showed HR 67 of beats/minute, BP 186/112 - 214/130 mm Hg, the RR 16 breaths/minute, 98%O2 Sat on RA and Temp of 98.48F (36.7C).    Pertinent Labs/Diagnostics Findings: Unremarkable CBC and CMP.  UDS positive.  Stat noncontrast CT was obtained which showed acute left basal ganglia intraparenchymal hemorrhage with mild edema and minimal mass effect.  Patient was evaluated by teleneurologist who ordered additional imaging with recommendations as noted.  Follow-up CTA head and neck showed no acute intracranial abnormality or LVO.     The patient was then admitted to [facility] service for close monitoring.  Pertinent  Medical History  COPD HTN HLD Arthritis Polysubstance abuse (cocaine, marijuana, tobacco use)  Significant Hospital Events: Including procedures, antibiotic start and stop dates in addition to other pertinent events   10/18: Admitted to ICU with acute left basal ganglia intraparenchymal hemorrhagic stroke iso suspected cocaine induced hypertensive emergency. Headache reported in PM, CT head repeated 10/19: feels more weak in the right side, able to verbalize  and follow commands. Repeat CT head ordered given NIH increased to 15.   Interim History / Subjective:  Awake and alert, unable to move his right upper or lower extremity. Denies headache. Feels confused.  Objective    Blood pressure (!) 140/91, pulse (!) 102, temperature 99 F (37.2 C), temperature source Oral, resp. rate (!) 24, height 5' 5 (1.651 m), weight 63.1 kg, SpO2 99%.        Intake/Output Summary (Last 24 hours) at 10/25/2023 9072 Last data filed at 10/25/2023 0600 Gross per 24 hour  Intake 820.55 ml  Output 850 ml  Net -29.45 ml   Filed Weights   10/24/23 0500 10/25/23 0400  Weight: 62.9 kg 63.1 kg    Examination: Physical Exam Constitutional:      General: He is not in acute distress.    Appearance: He is ill-appearing.  Cardiovascular:     Rate and Rhythm: Normal rate and regular rhythm.     Pulses: Normal pulses.     Heart sounds: Normal heart sounds.  Pulmonary:     Effort: Pulmonary effort is normal.     Breath sounds: Normal breath sounds.  Neurological:     Mental Status: He is oriented to person, place, and time.     Motor: Weakness present.     Comments: Awake and alert, right sided weakness, with motor strength 0/5 in the upper and lower extremity. Pupils equal and reactive. Unable to assess gain.     Assessment and Plan   68 year old male with history of longstanding cocaine use and PTSD presenting with right-sided weakness and found to have a left basal  ganglia intracranial hemorrhage as well as hypertensive emergency.  Toxicology screen was positive for cocaine.  #Intracranial Hemorrhage #Hemorrhagic stroke #Hypertensive Emergency #Cocaine Abuse  Neuro - admitted with hemorrhagic stroke in the left basal ganglia secondary to hypertensive emergency and cocaine use.  Repeat CT yesterday did not show increase in the size of the hemorrhage but he continues to have progressive neurological weakness and we will repeat the CT head today.  MRI of  the brain ordered.  Blood pressure control per CV section.  Continued on frequent neurochecks.  Appreciate input from neurology.  -MRI brain  -repeat CT head stat  -frequent neurochecks  CV - hypertensive emergency secondary to cocaine abuse and resulting in intracranial hemorrhage and hemorrhagic stroke.  Started on clevidipine gtt. for BP to 130 and 150 systolic.  We have also started 10 mg of amlodipine  daily as well as standing hydralazine  with as needed hydralazine .  Will continue to add antihypertensives for BP control and attempt to wean off drip.  Pulmonary - no active issues at this point, he is on room air  GI - seen and evaluated by SLP yesterday, cleared for diet.  Renal - kidney function appears at baseline.  He does have hypokalemia today which we are replacing  Hem/Onc - holding DVT prophylaxis given hemorrhagic stroke.  Can likely resume in 48 hours.  Endo - ICU glycemic protocol with sliding scale insulin  ID -no active issues, monitor fever curve.  Labs   CBC: Recent Labs  Lab 10/23/23 2345 10/25/23 0426  WBC 3.8* 6.3  NEUTROABS 2.1 3.7  HGB 13.3 15.1  HCT 40.5 44.7  MCV 90.0 87.1  PLT 253 284    Basic Metabolic Panel: Recent Labs  Lab 10/23/23 2345 10/25/23 0426  NA 137 137  K 3.5 3.2*  CL 102 101  CO2 23 26  GLUCOSE 91 106*  BUN 13 12  CREATININE 1.08 1.07  CALCIUM 9.4 9.2   GFR: Estimated Creatinine Clearance: 55.9 mL/min (by C-G formula based on SCr of 1.07 mg/dL). Recent Labs  Lab 10/23/23 2345 10/25/23 0426  WBC 3.8* 6.3    Liver Function Tests: Recent Labs  Lab 10/23/23 2345  AST 26  ALT 10  ALKPHOS 50  BILITOT 0.8  PROT 8.5*  ALBUMIN 4.4   No results for input(s): LIPASE, AMYLASE in the last 168 hours. No results for input(s): AMMONIA in the last 168 hours.  ABG    Component Value Date/Time   HCO3 28.5 (H) 10/19/2022 1221   ACIDBASEDEF 0.4 02/18/2018 0205   O2SAT 54.8 10/19/2022 1221     Coagulation  Profile: Recent Labs  Lab 10/23/23 2345  INR 1.0    Cardiac Enzymes: No results for input(s): CKTOTAL, CKMB, CKMBINDEX, TROPONINI in the last 168 hours.  HbA1C: No results found for: HGBA1C  CBG: Recent Labs  Lab 10/23/23 2343 10/24/23 0255  GLUCAP 86 89    Review of Systems:   N/A  Past Medical History:  He,  has a past medical history of Arthritis, COPD (chronic obstructive pulmonary disease) (HCC), Hyperlipidemia, and Hypertension.   Surgical History:   Past Surgical History:  Procedure Laterality Date   NO PAST SURGERIES       Social History:   reports that he has been smoking cigarettes. He has never used smokeless tobacco. He reports current alcohol use. He reports current drug use. Drugs: Marijuana and Crack cocaine.   Family History:  His family history is not on file.   Allergies Allergies  Allergen Reactions   Benzoyl Peroxide Anaphylaxis   Fish Oil Anaphylaxis   Methocarbamol Anaphylaxis   Pineapple Anaphylaxis   Carbomer    Tuberculin, Ppd     Other Reaction(s): Eruption of skin   Tuberculin Purified Protein Derivative Hives and Rash     Home Medications  Prior to Admission medications   Medication Sig Start Date End Date Taking? Authorizing Provider  acetaminophen  (TYLENOL ) 325 MG tablet Take 2 tablets (650 mg total) by mouth every 6 (six) hours as needed for mild pain (pain score 1-3), fever or headache. 06/12/23   Von Bellis, MD  albuterol  (VENTOLIN  HFA) 108 (90 Base) MCG/ACT inhaler Inhale 2 puffs into the lungs every 6 (six) hours as needed for wheezing or shortness of breath. 06/12/23   Von Bellis, MD  amLODipine  (NORVASC ) 10 MG tablet Take 1 tablet (10 mg total) by mouth daily. Hold if SBP <130 06/12/23 06/11/24  Von Bellis, MD  metoprolol  tartrate (LOPRESSOR ) 25 MG tablet Take 1 tablet (25 mg total) by mouth 2 (two) times daily. Hold if SBP <130 and or HR <65 06/12/23 06/11/24  Von Bellis, MD  polyethylene glycol (MIRALAX  /  GLYCOLAX ) 17 g packet Take 17 g by mouth daily as needed for mild constipation. 10/20/22   Dorinda Drue DASEN, MD     The patient is critically ill due to hemorrhagic stroke, intracranial hemorrhage, encephalopathy, cocaine abuse, hypertensive emergency.  Critical care was necessary to treat or prevent imminent or life-threatening deterioration. I personally performed high risk medication and infusion titration and management, frequent neurological checks, and administration of IV electrolytes. Critical care time was spent by me on the following activities: development of a treatment plan with the patient and/or surrogate as well as nursing, discussions with consultants, evaluation of the patient's response to treatment, examination of the patient, obtaining a history from the patient or surrogate, ordering and performing treatments and interventions, ordering and review of laboratory studies, ordering and review of radiographic studies, review of telemetry data including pulse oximetry, re-evaluation of patient's condition and participation in multidisciplinary rounds.   I personally spent 63 minutes providing critical care not including any separately billable procedures.   Belva November, MD [facility] 10/25/2023 9:36 AM

## 2023-10-25 NOTE — Progress Notes (Signed)
 Speech Language Pathology Treatment: Dysphagia  Patient Details Name: Parker Brown MRN: [id number] DOB: 07-14-1955 Today's Date: 10/25/2023 Time: 1100-1140 SLP Time Calculation (min) (ACUTE ONLY): 40 min  Assessment / Plan / Recommendation Clinical Impression  Pt seen for ongoing assessment of swallowing; toleration of current Regular diet, thins. He was alert/awake, easily distracted w/ things ongoing in his room as NSG was prepping pt for imaging soon. Pt was perseverating on his name Parker Brown, it's [place]); he had difficulty answering direct/basic y/n questions re: himself, city he lived in, and DOB. Suspect Cog-ling deficits s/p Acute, Left hemorrhagic infarct/ICH and polysubstance abuse/use. Has some RUE weakness noted when holding Cup w/ SLP.  On RA, afebrile. WBC WNL.   NSG reported good toleration of current diet w/ no reports of aspiration. Confusion though.   Pt explained general aspiration precautions and the need for following them especially sitting upright for all oral intake and using small sips/bites slowly -- supported behind the back for full upright sitting. Pt assisted w/ positioning d/t weakness and easily distracted. He helped to Hold Cup to feed himself sips of liquids; food trials fed to him by SLP. No overt clinical s/s of aspiration were noted w/ any trial; respiratory status remained calm and unlabored, vocal quality clear b/t trials, no coughing. No decline in O2 sats during/after. Oral phase appeared grossly [name] for bolus management and timely A-P transfer for swallowing; oral clearing achieved w/ all consistencies.    Pt appears at reduced risk for aspriation when following general aspiration precautions and when Supervised during oral intake/meals d/t his decline in functional communication and Cognitive awareness/tasks.  Recommend continue a Regular diet w/ tray setup and Prep(cutting foods d/t RUE weakness) w/ gravies added to moisten foods; Thin liquids.  Recommend general aspiration precautions including small bites/sips slowly; reduce distractions during meals. Pills w/ Water or Whole in Puree if needed. Positioning assistance Upright for meals, po's. REFLUX precautions.  ST services can be available if any further swallowing needs, education indicated. ST services will f/u w/ Cognitive-linguistic assessment to determine pt's communication needs for Discharge. NSG updated, agreed. Precautions posted at bedside, chart.        HPI HPI: 68 y.o male with significant PMHx of COPD, Polysubstance abuse (Cocaine, marijuana, current everyday smoker), HTN, HLD, and arthritis who presented to the ED s/p Fall and complaints of right-sided weakness.  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 Patchy white matter hypodensities compatible with chronic microvascular ischemic disease.   CXR: No active disease. No metallic foreign body identified.    MRI 10/19:  Acute hemorrhagic infarct involving the left centrum semiovale, left corona  radiata, and external capsule with partial involvement of the posterior left  lentiform nucleus. Similar appearance of intraparenchymal hematoma.  2. Surrounding edema and mild local mass effect. No significant midline shift.  3. Moderate chronic microvascular ischemic changes.      SLP Plan  Continue with current plan of care; (CLE to follow)          Recommendations  Diet recommendations: Regular;Thin liquid (cut foods/meats; setup support) Liquids provided via: Cup;Straw (monitor) Medication Administration: Whole meds with liquid (vs Whole in Puree if need) Supervision: Patient able to self feed;Staff to assist with self feeding;Full supervision/cueing for compensatory strategies (d/t Cognitive decline) Compensations: Minimize environmental distractions;Slow rate;Small sips/bites;Follow solids with  liquid Postural Changes and/or Swallow Maneuvers: Out of  bed for meals;Seated upright 90 degrees;Upright 30-60 min after meal                Rehab consult (Dietician f/u for support) Oral care BID;Oral care before and after PO;Staff/trained caregiver to provide oral care (support pt)   Frequent or constant Supervision/Assistance (d/t Cognition) Dysphagia, unspecified (R13.10) (in setting of Left Acute hemorrhagic infarct and ICH; polysubstance abuse/use; hospitalization)     Continue with current plan of care (CLE)      Parker Portugal, MS, CCC-SLP Speech Language Pathologist Rehab Services; [facility] [phone] (ascom) Parker Brown  10/25/2023, 2:28 PM

## 2023-10-25 NOTE — Plan of Care (Signed)
  Problem: Education: Goal: Knowledge of disease or condition will improve Outcome: Not Progressing   Problem: Intracerebral Hemorrhage Tissue Perfusion: Goal: Complications of Intracerebral Hemorrhage will be minimized Outcome: Progressing   Problem: Elimination: Goal: Will not experience complications related to bowel motility Outcome: Progressing Goal: Will not experience complications related to urinary retention Outcome: Progressing   Problem: Pain Managment: Goal: General experience of comfort will improve and/or be controlled Outcome: Progressing

## 2023-10-25 NOTE — Consult Note (Signed)
 PHARMACY CONSULT NOTE - ELECTROLYTES  Pharmacy Consult for Electrolyte Monitoring and Replacement   Recent Labs: Height: 5' 5 (165.1 cm) Weight: 63.1 kg (139 lb 1.8 oz) IBW/kg (Calculated) : 61.5 Estimated Creatinine Clearance: 55.9 mL/min (by C-G formula based on SCr of 1.07 mg/dL). Potassium (mmol/L)  Date Value  10/25/2023 3.2 (L)   Magnesium (mg/dL)  Date Value  93/93/7974 2.1   Calcium (mg/dL)  Date Value  89/80/7974 9.2   Albumin (g/dL)  Date Value  89/82/7974 4.4   Phosphorus (mg/dL)  Date Value  93/93/7974 4.0   Sodium (mmol/L)  Date Value  10/25/2023 137    Assessment  Parker Brown is a 68 y.o. male presenting with right-sided weakness and a fall. PMH significant for COPD, polysubstance abuse (cocaine, marijuana, current everyday smoker), HTN, HLD, and arthritis . Pharmacy has been consulted to monitor and replace electrolytes.  Diet: NPO Pertinent medications: clevidipine gtt  Goal of Therapy: Electrolytes WNL  Plan:  K 3.2: Kcl 40mEq PO x 2 Check BMP, Mg, Phos with AM labs  Thank you for allowing pharmacy to be a part of this patient's care.  Wilhemina Grall A Tammye Kahler, PharmD Clinical Pharmacist 10/25/2023 7:15 AM

## 2023-10-25 NOTE — Progress Notes (Signed)
 Patient requested someone call his emergency contact [name]. This RN called the number provided in the chart multiple times but the call went straight to voicemail. Will try again today.

## 2023-10-25 NOTE — Evaluation (Signed)
 Physical Therapy Evaluation Patient Details Name: Parker Brown MRN: [id number] DOB: [birth year] Today's Date: 10/25/2023  History of Present Illness  Patient is a 68 year old male with fall, acute left basal ganglia ICH, hypertensive emergency. Repeat head CT stable. PMH: hypertension and polysubstance abuse  Clinical Impression  Patient is restless and internally distracted during evaluation. He is able to follow most single step commands with increased time. He reports he lives with his sister and is independent with mobility at baseline.  Today the patient has right side weakness. No active movement noted in RUE. Trace movement in right hip, otherwise no movement noted in RLE. He reports impaired sensation also. No headache is reported with mobility. Inattention to right side with movement. Safety cues required with movement as patient is impulsive. Minimal assistance required to sit upright with poor sitting posture. Activity tolerance limited by fatigue today. He is not at his baseline level of functional independence. Recommend PT follow up to maximize independence and decrease caregiver burden. Consider rehabilitation > 3 hours/day after this hospital stay.       If plan is discharge home, recommend the following: A little help with walking and/or transfers;A little help with bathing/dressing/bathroom;Assist for transportation;Help with stairs or ramp for entrance;Supervision due to cognitive status;Assistance with cooking/housework;Direct supervision/assist for medications management;Direct supervision/assist for financial management   Can travel by private vehicle        Equipment Recommendations  (TBD)  Recommendations for Other Services  Rehab consult    Functional Status Assessment Patient has had a recent decline in their functional status and demonstrates the ability to make significant improvements in function in a reasonable and predictable amount of time.      Precautions / Restrictions Precautions Precautions: Fall Recall of Precautions/Restrictions: Impaired Precaution/Restrictions Comments: SBP< 150 Restrictions Weight Bearing Restrictions Per Provider Order: No      Mobility  Bed Mobility Overal bed mobility: Needs Assistance Bed Mobility: Rolling, Supine to Sit, Sit to Supine Rolling: Supervision   Supine to sit: Min assist Sit to supine: Min assist   General bed mobility comments: patient needs safety cues throughout. he moves quickly with inattention of right side. faciliation for right hemi body.    Transfers                   General transfer comment: not attempted. recommend +2 person for  standing attempts initially as patient is impulsive. he reports need to lay down quickly after sitting up, due to fatigue    Ambulation/Gait                  Stairs            Wheelchair Mobility     Tilt Bed    Modified Rankin (Stroke Patients Only)       Balance Overall balance assessment: Needs assistance Sitting-balance support: Feet unsupported, Single extremity supported Sitting balance-Leahy Scale: Poor Sitting balance - Comments: forward flexed posture.                                     Pertinent Vitals/Pain Pain Assessment Pain Assessment: No/denies pain    Home Living Family/patient expects to be discharged to:: Private residence Living Arrangements: Other (Comment) (sister) Available Help at Discharge:  (unsure) Type of Home: House Home Access: Stairs to enter   [employer] of Steps:  a few  Additional Comments: patient is a poor historian    Prior Function Prior Level of Function : Independent/Modified Independent;Patient poor historian/Family not available             Mobility Comments: patient reports he is independent, no family present to confirm       Extremity/Trunk Assessment   Upper Extremity Assessment Upper Extremity  Assessment: Right hand dominant;RUE deficits/detail RUE Deficits / Details: no active movement noted RUE. flaccid RUE Sensation: decreased light touch (difficult to assess as patient restless at times, internally distracted) RUE Coordination: decreased fine motor;decreased gross motor    Lower Extremity Assessment Lower Extremity Assessment: RLE deficits/detail RLE Deficits / Details: 1/5 hip flexion, hip add/abd, othrewise 0/5 RLE Sensation: decreased light touch RLE Coordination: decreased fine motor;decreased gross motor       Communication   Communication Communication: Impaired Factors Affecting Communication: Difficulty expressing self    Cognition Arousal: Alert Behavior During Therapy: Restless, Impulsive   PT - Cognitive impairments: No family/caregiver present to determine baseline, Memory, Orientation, Safety/Judgement, Sequencing, Initiation, Problem solving   Orientation impairments: Time                   PT - Cognition Comments: patient able to state today is the 19th, but unable to state the month or year. impulsive with mobility tasks with safety cues required. internally distracted Following commands: Impaired Following commands impaired: Follows one step commands with increased time     Cueing Cueing Techniques: Verbal cues, Gestural cues, Tactile cues, Visual cues     General Comments General comments (skin integrity, edema, etc.): no headache is reported with mobility efforts today. vitals monitored throughout session    Exercises     Assessment/Plan    PT Assessment Patient needs continued PT services  PT Problem List Decreased strength;Decreased range of motion;Decreased activity tolerance;Decreased balance;Decreased mobility;Decreased coordination;Decreased cognition;Decreased knowledge of use of DME;Decreased safety awareness;Impaired tone;Impaired sensation       PT Treatment Interventions DME instruction;Gait training;Stair  training;Functional mobility training;Therapeutic activities;Therapeutic exercise;Balance training;Neuromuscular re-education;Cognitive remediation;Patient/family education;Wheelchair mobility training    PT Goals (Current goals can be found in the Care Plan section)  Acute Rehab PT Goals Patient Stated Goal: none stated Time For Goal Achievement: 11/08/23 Potential to Achieve Goals: Fair    Frequency Min 3X/week     Co-evaluation               AM-PAC PT 6 Clicks Mobility  Outcome Measure Help needed turning from your back to your side while in a flat bed without using bedrails?: A Little Help needed moving from lying on your back to sitting on the side of a flat bed without using bedrails?: A Little Help needed moving to and from a bed to a chair (including a wheelchair)?: A Lot Help needed standing up from a chair using your arms (e.g., wheelchair or bedside chair)?: A Lot Help needed to walk in hospital room?: Total Help needed climbing 3-5 steps with a railing? : Total 6 Click Score: 12    End of Session   Activity Tolerance: Patient limited by fatigue Patient left: in bed;with call bell/phone within reach;with bed alarm set;with SCD's reapplied Nurse Communication: Mobility status PT Visit Diagnosis: Hemiplegia and hemiparesis;Other abnormalities of gait and mobility (R26.89) Hemiplegia - Right/Left: Right Hemiplegia - dominant/non-dominant: Dominant Hemiplegia - caused by: Nontraumatic intracerebral hemorrhage    Time: 9161-9149 PT Time Calculation (min) (ACUTE ONLY): 12 min   Charges:   PT Evaluation $PT Eval Moderate Complexity:  1 Mod   PT General Charges $$ ACUTE PT VISIT: 1 Visit         Randine Essex, PT, MPT   Randine LULLA Essex 10/25/2023, 10:37 AM

## 2023-10-26 ENCOUNTER — Inpatient Hospital Stay

## 2023-10-26 DIAGNOSIS — R4182 Altered mental status, unspecified: Secondary | ICD-10-CM

## 2023-10-26 DIAGNOSIS — I6389 Other cerebral infarction: Secondary | ICD-10-CM

## 2023-10-26 DIAGNOSIS — I161 Hypertensive emergency: Secondary | ICD-10-CM

## 2023-10-26 DIAGNOSIS — Z515 Encounter for palliative care: Secondary | ICD-10-CM | POA: Diagnosis not present

## 2023-10-26 DIAGNOSIS — I619 Nontraumatic intracerebral hemorrhage, unspecified: Secondary | ICD-10-CM | POA: Diagnosis not present

## 2023-10-26 DIAGNOSIS — R29898 Other symptoms and signs involving the musculoskeletal system: Secondary | ICD-10-CM | POA: Diagnosis not present

## 2023-10-26 DIAGNOSIS — R569 Unspecified convulsions: Secondary | ICD-10-CM | POA: Diagnosis not present

## 2023-10-26 LAB — GLUCOSE, CAPILLARY
Glucose-Capillary: 107 mg/dL — ABNORMAL HIGH (ref 70–99)
Glucose-Capillary: 117 mg/dL — ABNORMAL HIGH (ref 70–99)
Glucose-Capillary: 128 mg/dL — ABNORMAL HIGH (ref 70–99)
Glucose-Capillary: 107 mg/dL — ABNORMAL HIGH (ref 70–99)
Glucose-Capillary: 104 mg/dL — ABNORMAL HIGH (ref 70–99)
Glucose-Capillary: 133 mg/dL — ABNORMAL HIGH (ref 70–99)

## 2023-10-26 LAB — BASIC METABOLIC PANEL WITH GFR
Anion gap: 15 (ref 5–15)
BUN: 15 mg/dL (ref 8–23)
CO2: 21 mmol/L — ABNORMAL LOW (ref 22–32)
Calcium: 9 mg/dL (ref 8.9–10.3)
Chloride: 100 mmol/L (ref 98–111)
Creatinine, Ser: 1.2 mg/dL (ref 0.61–1.24)
GFR, Estimated: >60 mL/min (ref >60)
Glucose, Bld: 128 mg/dL — ABNORMAL HIGH (ref 70–99)
Potassium: 3.8 mmol/L (ref 3.5–5.1)
Sodium: 136 mmol/L (ref 135–145)

## 2023-10-26 LAB — CBC
HCT: 43 % (ref 39.0–52.0)
Hemoglobin: 14.3 g/dL (ref 13.0–17.0)
MCH: 29.2 pg (ref 26.0–34.0)
MCHC: 33.3 g/dL (ref 30.0–36.0)
MCV: 87.9 fL (ref 80.0–100.0)
Platelets: 257 K/uL (ref 150–400)
RBC: 4.89 MIL/uL (ref 4.22–5.81)
RDW: 14.6 % (ref 11.5–15.5)
WBC: 6.7 K/uL (ref 4.0–10.5)
nRBC: 0 % (ref 0.0–0.2)

## 2023-10-26 LAB — PHOSPHORUS: Phosphorus: 4.1 mg/dL (ref 2.5–4.6)

## 2023-10-26 LAB — MAGNESIUM: Magnesium: 2 mg/dL (ref 1.7–2.4)

## 2023-10-26 MED ORDER — INSULIN ASPART 100 UNIT/ML IJ SOLN
0.0000 [IU] | Freq: Three times a day (TID) | INTRAMUSCULAR | Status: DC
  Administered 2023-10-26 – 2023-11-01 (×6): 1 [IU] via SUBCUTANEOUS
  Filled 2023-10-26 (×5): qty 1

## 2023-10-26 NOTE — TOC Progression Note (Signed)
 Transition of Care [facility]) - Progression Note    Patient Details  Name: Parker Brown MRN: [id number] Date of Birth: 1955-12-08  [facility] of [facility] CM/SW Contact  K'La JINNY Ruts, LCSW Phone Number: 10/26/2023, 11:22 AM  Clinical Narrative:    Chart reviewed. I attempted to consult with the patient at bedside. I called the patient name 2x and the patient did not wake up. I will come at a later time to complete TOC screen.                       Expected Discharge Plan and Services                                               Social Drivers of Health (SDOH) Interventions SDOH Screenings   Food Insecurity: No Food Insecurity (10/24/2023)  Housing: Low Risk  (10/24/2023)  Transportation Needs: No Transportation Needs (10/24/2023)  Utilities: Not At Risk (10/24/2023)  Social Connections: Socially Isolated (10/24/2023)  Tobacco Use: High Risk (10/23/2023)    Readmission Risk Interventions     No data to display

## 2023-10-26 NOTE — Evaluation (Signed)
 Speech Language Pathology Evaluation Patient Details Name: Parker Brown MRN: [id number] DOB: [birth year] Today's Date: 10/26/2023 Time: 8854-8794 SLP Time Calculation (min) (ACUTE ONLY): 20 min  Problem List:  Patient Active Problem List   Diagnosis Date Noted   Acute hemorrhagic infarction of brain (HCC) 10/24/2023   Intraparenchymal hemorrhage of brain (HCC) 10/24/2023   Hypertensive emergency 10/24/2023   Posttraumatic stress disorder 10/19/2022   Osteoarthritis 10/19/2022   Chronic low back pain 10/19/2022   Diarrhea 10/19/2022   Diverticulosis of colon 10/19/2022   Chronic hepatitis C (HCC) 10/19/2022   History of tobacco use 03/15/2022   Polysubstance abuse (HCC) 03/15/2022   Insomnia 03/15/2022   Musculoskeletal chest pain 03/15/2022   COPD exacerbation (HCC) 01/10/2018   COPD with acute exacerbation (HCC) 06/09/2017   HTN (hypertension) 06/09/2017   HLD (hyperlipidemia) 06/09/2017   Carpal tunnel syndrome 01/07/1999   Past Medical History:  Past Medical History:  Diagnosis Date   Arthritis    COPD (chronic obstructive pulmonary disease) (HCC)    Hyperlipidemia    Hypertension    Past Surgical History:  Past Surgical History:  Procedure Laterality Date   NO PAST SURGERIES     HPI:  68 y.o male with significant PMHx of COPD, Polysubstance abuse (Cocaine, marijuana, current everyday smoker), HTN, HLD, and arthritis who presented to the ED s/p Fall and complaints of right-sided weakness.  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 Patchy white matter hypodensities compatible with chronic microvascular ischemic disease.   CXR: No active disease. No metallic foreign body identified.   MRI 10/19:  Acute hemorrhagic infarct involving the left centrum semiovale, left corona  radiata, and external capsule with partial involvement of the  posterior left  lentiform nucleus. Similar appearance of intraparenchymal hematoma.  2. Surrounding edema and mild local mass effect. No significant midline shift.  3. Moderate chronic microvascular ischemic changes.   Assessment / Plan / Recommendation Clinical Impression  Pt seen for cognitive linguistic evaluation in the setting of acute ICH. Assessment consisting of pt interview and completion of portions of the MMSE. Pt presents with grossly significant cognitive communication deficit, c/b impaired simple expressive language (confrontational naming, repetition), semi complex auditory comprehension (multi step commands), orientation, short term recall, problem solving, thought organization, and sustained attention. Spontaneous verbal expression sparse, with low volume noted, intermittent neologism and phonemic paraphasias noted when language was directly challenged. However, pt able to make needs known during OT/PT session per report. Question if limited verbalizations could be in relation to self limiting d/t errors present in speech. Pt reports primary concern in reduced mobility, though reported trouble with speech to other members of the medical team. Overall, presentation does seem more impaired compared to therapist initial encounter with pt on 10/18. Neurologist reporting that fluctuations in speech/cognition could be related to subclinical seizure activity versus EtOH withdrawal. Family not present to identify baseline/variance at this time. Therefore, recommend continued assessment to determine approximation to baseline and follow up recommendations. Pt in agreement with stated plan. Recommend follow up services at next level of care     SLP Assessment  SLP Recommendation/Assessment: Patient needs continued Speech Language Pathology Services SLP Visit Diagnosis: Dysphagia, oral phase (R13.11);Cognitive communication deficit (R41.841) (?aphasia, dysarthria)     Assistance Recommended at  Discharge  Frequent or constant Supervision/Assistance  Functional Status Assessment Patient has had a recent decline in their functional  status and demonstrates the ability to make significant improvements in function in a reasonable and predictable amount of time.  Frequency and Duration min 2x/week  2 weeks      SLP Evaluation Cognition  Overall Cognitive Status: Impaired/Different from baseline Arousal/Alertness: Lethargic Orientation Level: Oriented to person;Oriented to place;Disoriented to time;Disoriented to situation (MMSE 5/10) Attention: Sustained Sustained Attention: Impaired Sustained Attention Impairment: Verbal basic Memory: Impaired Memory Impairment: Decreased short term memory (reduced short term and functional recall) Decreased Short Term Memory: Verbal basic Awareness: Impaired Awareness Impairment: Intellectual impairment Problem Solving: Impaired Problem Solving Impairment: Verbal basic (MMSE mental math 1/5) Executive Function: Initiating Initiating: Impaired Initiating Impairment: Functional basic Behaviors: Lability Safety/Judgment: Other (comment) Comments: pt aware of need to stay in bed and call for help       Comprehension  Auditory Comprehension Overall Auditory Comprehension: Impaired Yes/No Questions: Within Functional Limits Commands: Impaired Multistep Basic Commands: 50-74% accurate Conversation: Simple Interfering Components: Attention;Motor planning;Processing speed;Working Radio broadcast assistant: Mining engineer: Not tested Reading Comprehension Reading Status: Not tested    Expression Expression Primary Mode of Expression: Verbal Verbal Expression Overall Verbal Expression: Impaired Initiation: No impairment Automatic Speech: Name;Social Response Level of Generative/Spontaneous Verbalization: Sentence Repetition: Impaired Level of Impairment: Word  level Naming: Impairment Confrontation: Impaired Convergent: 50-74% accurate Verbal Errors: Neologisms;Phonemic paraphasias;Language of confusion Pragmatics: Impairment Impairments: Abnormal affect;Eye contact;Monotone Interfering Components: Attention;Speech intelligibility Effective Techniques: Semantic cues Non-Verbal Means of Communication: Not applicable Written Expression Dominant Hand: Right Written Expression: Not tested   Oral / Motor  Oral Motor/Sensory Function Overall Oral Motor/Sensory Function: Within functional limits Motor Speech Overall Motor Speech: Impaired Respiration: Within functional limits Phonation: Low vocal intensity Resonance: Within functional limits Articulation: Impaired Level of Impairment: Sentence Intelligibility: Intelligibility reduced Phrase: 50-74% accurate Sentence: 50-74% accurate Conversation: 50-74% accurate Motor Planning:  (needs further assessment) Effective Techniques: Pause;Increased vocal intensity           [place] Libi Corso Clapp, MS, CCC-SLP Speech Language Pathologist Rehab Services; [facility] [phone] (ascom)   [place] J Clapp 10/26/2023, 12:59 PM

## 2023-10-26 NOTE — Plan of Care (Signed)
  Problem: Education: Goal: Knowledge of disease or condition will improve Outcome: Not Progressing   Problem: Intracerebral Hemorrhage Tissue Perfusion: Goal: Complications of Intracerebral Hemorrhage will be minimized Outcome: Not Progressing   Problem: Clinical Measurements: Goal: Respiratory complications will improve Outcome: Progressing Goal: Cardiovascular complication will be avoided Outcome: Progressing   Problem: Elimination: Goal: Will not experience complications related to bowel motility Outcome: Progressing Goal: Will not experience complications related to urinary retention Outcome: Progressing   Problem: Pain Managment: Goal: General experience of comfort will improve and/or be controlled Outcome: Progressing

## 2023-10-26 NOTE — Progress Notes (Signed)
 Ceribell removed per Dr. Isaiah order.

## 2023-10-26 NOTE — Care Management Important Message (Signed)
 Important Message  Patient Details  Name: Parker Brown MRN: [id number] Date of Birth: 1955/05/22   Important Message Given:  Yes - Medicare IM     Rojelio [name] 10/26/2023, 12:53 PM

## 2023-10-26 NOTE — Evaluation (Signed)
 Occupational Therapy Evaluation Patient Details Name: Parker Brown MRN: [id number] DOB: 05/09/1955 Today's Date: 10/26/2023   History of Present Illness   Patient is a 68 year old male with fall, acute left basal ganglia ICH, hypertensive emergency. Repeat head CT stable. PMH: hypertension and polysubstance abuse     Clinical Impressions Patient presenting with decreased Ind in self care, balance, functional mobility/transfers, endurance, R UE/LE NMR, and safety awareness.Patient endorses being Ind at living at home with sister. No family present to confirm baseline. Pt is difficulty to understand at times and clearly having difficulty processing. Pt needing +2 assistance for bed mobility and to stand. Heavy blocking needed on R knee with manual facilitation to shift weight to midline and facilitate upright trunk. Pt returning to sitting position and appears to be pushing towards the R. R UE placed into weight bearing with assistance and balance needed for static sitting. +2 assistance to return to supine . Pt is far from baseline. Patient will benefit from acute OT to increase overall independence in the areas of ADLs, functional mobility, and safety awareness in order to safely discharge.     If plan is discharge home, recommend the following:   Two people to help with walking and/or transfers;Two people to help with bathing/dressing/bathroom;Assistance with cooking/housework;Assistance with feeding;Direct supervision/assist for medications management;Assist for transportation;Direct supervision/assist for financial management;Help with stairs or ramp for entrance;Supervision due to cognitive status     Functional Status Assessment   Patient has had a recent decline in their functional status and demonstrates the ability to make significant improvements in function in a reasonable and predictable amount of time.     Equipment Recommendations   Other (comment) (defer to  next venue of care)      Precautions/Restrictions   Precautions Precautions: Fall Recall of Precautions/Restrictions: Impaired Precaution/Restrictions Comments: SBP< 150 Restrictions Weight Bearing Restrictions Per Provider Order: No     Mobility Bed Mobility Overal bed mobility: Needs Assistance Bed Mobility: Supine to Sit, Sit to Supine     Supine to sit: Mod assist, +2 for physical assistance, HOB elevated Sit to supine: Max assist, +2 for physical assistance        Transfers Overall transfer level: Needs assistance Equipment used: 2 person hand held assist Transfers: Sit to/from Stand Sit to Stand: Total assist, +2 physical assistance           General transfer comment: totalAx2 STS from EOB, max facilitation of anterior weight shift and pushes to R side in standing      Balance Overall balance assessment: Needs assistance Sitting-balance support: Feet supported Sitting balance-Leahy Scale: Poor Sitting balance - Comments: requires external support to maintain midline, tendency to lean to R and anteriorly. Max multimodal cues to achieve midline   Standing balance support: Bilateral upper extremity supported Standing balance-Leahy Scale: Zero Standing balance comment: heavy +2 to maintain standing, fatigues quickly                           ADL either performed or assessed with clinical judgement   ADL Overall ADL's : Needs assistance/impaired                     Lower Body Dressing: Total assistance                       Vision Patient Visual Report: No change from baseline  Pertinent Vitals/Pain Pain Assessment Pain Assessment: Faces Faces Pain Scale: No hurt     Extremity/Trunk Assessment Upper Extremity Assessment Upper Extremity Assessment: Right hand dominant;RUE deficits/detail RUE Deficits / Details: no active movement noted RUE. flaccid RUE Sensation: decreased light touch;decreased  proprioception RUE Coordination: decreased fine motor;decreased gross motor           Communication Communication Communication: Impaired Factors Affecting Communication: Difficulty expressing self   Cognition Arousal: Alert Behavior During Therapy: Restless, Flat affect, Impulsive Cognition: No family/caregiver present to determine baseline, Cognition impaired   Orientation impairments: Place, Situation, Time Awareness: Intellectual awareness impaired, Online awareness impaired   Attention impairment (select first level of impairment): Focused attention Executive functioning impairment (select all impairments): Initiation, Organization, Sequencing, Reasoning, Problem solving                   Following commands: Impaired Following commands impaired: Follows one step commands with increased time, Follows one step commands inconsistently     Cueing  General Comments   Cueing Techniques: Verbal cues;Gestural cues;Tactile cues;Visual cues              Home Living Family/patient expects to be discharged to:: Private residence Living Arrangements: Other (Comment) (sister) Available Help at Discharge: Other (Comment) (unsure) Type of Home: House Home Access: Stairs to enter [employer] of Steps:  a few                       Additional Comments: patient is a poor historian  Lives With: Family    Prior Functioning/Environment Prior Level of Function : Independent/Modified Independent;Patient poor historian/Family not available             Mobility Comments: patient reports he is independent, no family present to confirm ADLs Comments: Pt reports being Ind at baseline and living with sister    OT Problem List: Decreased strength;Impaired vision/perception;Decreased knowledge of use of DME or AE;Decreased range of motion;Decreased coordination;Decreased activity tolerance;Decreased cognition;Impaired UE functional use;Impaired balance  (sitting and/or standing);Decreased safety awareness   OT Treatment/Interventions: Self-care/ADL training;Manual therapy;Therapeutic exercise;Patient/family education;Balance training;Energy conservation;Therapeutic activities;DME and/or AE instruction;Cognitive remediation/compensation;Neuromuscular education      OT Goals(Current goals can be found in the care plan section)   Acute Rehab OT Goals Patient Stated Goal: to get better OT Goal Formulation: With patient Time For Goal Achievement: 11/09/23 Potential to Achieve Goals: Fair ADL Goals Pt Will Perform Grooming: with min assist;sitting Pt Will Perform Lower Body Dressing: with min assist;sitting/lateral leans Pt Will Transfer to Toilet: with min assist;stand pivot transfer Pt Will Perform Toileting - Clothing Manipulation and hygiene: with min assist;sit to/from stand   OT Frequency:  Min 2X/week    Co-evaluation   Reason for Co-Treatment: Complexity of the patient's impairments (multi-system involvement);For patient/therapist safety PT goals addressed during session: Mobility/safety with mobility        AM-PAC OT 6 Clicks Daily Activity     Outcome Measure Help from another person eating meals?: A Lot Help from another person taking care of personal grooming?: A Lot Help from another person toileting, which includes using toliet, bedpan, or urinal?: A Lot Help from another person bathing (including washing, rinsing, drying)?: A Lot Help from another person to put on and taking off regular upper body clothing?: A Lot Help from another person to put on and taking off regular lower body clothing?: Total 6 Click Score: 11   End of Session Nurse Communication: Mobility status  Activity Tolerance: Patient limited  by fatigue Patient left: in bed;with call bell/phone within reach;with bed alarm set  OT Visit Diagnosis: Unsteadiness on feet (R26.81);Repeated falls (R29.6);Muscle weakness (generalized) (M62.81)                 Time: 8875-8857 OT Time Calculation (min): 18 min Charges:  OT General Charges $OT Visit: 1 Visit OT Evaluation $OT Eval Moderate Complexity: 1 [address] [place], OTR/L , CBIS ascom [phone]  10/26/23, 4:34 PM

## 2023-10-26 NOTE — Plan of Care (Signed)
  Problem: Education: Goal: Knowledge of disease or condition will improve Outcome: Progressing Goal: Knowledge of secondary prevention will improve (MUST DOCUMENT ALL) Outcome: Progressing Goal: Knowledge of patient specific risk factors will improve (DELETE if not current risk factor) Outcome: Progressing   Problem: Intracerebral Hemorrhage Tissue Perfusion: Goal: Complications of Intracerebral Hemorrhage will be minimized Outcome: Progressing   Problem: Coping: Goal: Will verbalize positive feelings about self Outcome: Progressing Goal: Will identify appropriate support needs Outcome: Progressing   Problem: Health Behavior/Discharge Planning: Goal: Ability to manage health-related needs will improve Outcome: Progressing Goal: Goals will be collaboratively established with patient/family Outcome: Progressing

## 2023-10-26 NOTE — Progress Notes (Signed)
 Physical Therapy Treatment Patient Details Name: Parker Brown MRN: [id number] DOB: 1955-03-18 Today's Date: 10/26/2023   History of Present Illness Patient is a 68 year old male with fall, acute left basal ganglia ICH, hypertensive emergency. Repeat head CT stable. PMH: hypertension and polysubstance abuse    PT Comments  Pt sleeping upon PT/OT entry, able to awaken with tactile stimulation, agreeable to participate in PT/OT co-tx. Pt required modA for supine > sit transfer, able to initiate movement of BLE toward EOB, suspect increased assistance required due to exiting bed to pt's R side. Pt able to tolerate sitting EOB for ~10 minutes during session, varying levels of assistance required and external cues to maintain midline in sitting due to tendency to lean anterior/laterally to R. Pt completed 1 STS from EOB with totalAx2, max facilitation of anterior weight shifting and demonstrated push to R side in standing requiring assistance for weight shifting to L. Pt was returned to supine with maxAx2, was left semi-supine in bed with all needs in reach. Pt would benefit from continued skilled PT intervention (>3 hrs/day) to maximize independence with functional mobility.   If plan is discharge home, recommend the following: Two people to help with walking and/or transfers;Two people to help with bathing/dressing/bathroom;Assistance with cooking/housework;Direct supervision/assist for medications management;Direct supervision/assist for financial management;Assist for transportation;Help with stairs or ramp for entrance;Supervision due to cognitive status   Can travel by private vehicle        Equipment Recommendations  Other (comment) (TBD at next venue of care)    Recommendations for Other Services       Precautions / Restrictions Precautions Precautions: Fall Recall of Precautions/Restrictions: Impaired Precaution/Restrictions Comments: SBP< 150 Restrictions Weight Bearing  Restrictions Per Provider Order: No     Mobility  Bed Mobility Overal bed mobility: Needs Assistance Bed Mobility: Supine to Sit, Sit to Supine     Supine to sit: Mod assist, +2 for physical assistance, HOB elevated Sit to supine: Max assist, +2 for physical assistance   General bed mobility comments: +2 trunk/BLE assist to exit bed to pt's R side, able to initiate RLE movement toward EOB    Transfers Overall transfer level: Needs assistance Equipment used: 2 person hand held assist Transfers: Sit to/from Stand Sit to Stand: Total assist, +2 physical assistance           General transfer comment: totalAx2 STS from EOB, max facilitation of anterior weight shift and pushes to R side in standing    Ambulation/Gait                   Stairs             Wheelchair Mobility     Tilt Bed    Modified Rankin (Stroke Patients Only)       Balance Overall balance assessment: Needs assistance Sitting-balance support: Feet supported Sitting balance-Leahy Scale: Poor Sitting balance - Comments: requires external support to maintain midline, tendency to lean to R and anteriorly. Max multimodal cues to achieve midline   Standing balance support: Bilateral upper extremity supported Standing balance-Leahy Scale: Zero Standing balance comment: heavy +2 to maintain standing, fatigues quickly                            Communication Communication Communication: Impaired Factors Affecting Communication: Difficulty expressing self  Cognition Arousal: Alert Behavior During Therapy: Restless, Flat affect   PT - Cognitive impairments: No family/caregiver present to determine baseline,  Memory, Orientation, Safety/Judgement, Sequencing, Initiation, Problem solving   Orientation impairments: Time                   PT - Cognition Comments: pt able to state that he had a fall, 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 Following commands: Impaired Following commands impaired: Follows one step commands with increased time, Follows one step commands inconsistently    Cueing Cueing Techniques: Verbal cues, Gestural cues, Tactile cues, Visual cues  Exercises      General Comments General comments (skin integrity, edema, etc.): VSS throughout session      Pertinent Vitals/Pain Pain Assessment Pain Assessment: Faces Faces Pain Scale: No hurt Pain Intervention(s): Monitored during session, Repositioned    Home Living                          Prior Function            PT Goals (current goals can now be found in the care plan section) Progress towards PT goals: Progressing toward goals    Frequency    Min 3X/week      PT Plan      Co-evaluation PT/OT/SLP Co-Evaluation/Treatment: Yes Reason for Co-Treatment: Complexity of the patient's impairments (multi-system involvement);For patient/therapist safety PT goals addressed during session: Mobility/safety with mobility        AM-PAC PT 6 Clicks Mobility   Outcome Measure  Help needed turning from your back to your side while in a flat bed without using bedrails?: A Little Help needed moving from lying on your back to sitting on the side of a flat bed without using bedrails?: A Lot Help needed moving to and from a bed to a chair (including a wheelchair)?: A Lot Help needed standing up from a chair using your arms (e.g., wheelchair or bedside chair)?: A Lot Help needed to walk in hospital room?: Total Help needed climbing 3-5 steps with a railing? : Total 6 Click Score: 11    End of Session   Activity Tolerance: Patient limited by fatigue Patient left: in bed;with call bell/phone within reach;with bed alarm set;with SCD's reapplied Nurse Communication: Mobility status PT Visit Diagnosis: Hemiplegia and hemiparesis;Other abnormalities of gait and mobility (R26.89) Hemiplegia - Right/Left: Right Hemiplegia -  dominant/non-dominant: Dominant Hemiplegia - caused by: Nontraumatic intracerebral hemorrhage     Time: 1120-1145 PT Time Calculation (min) (ACUTE ONLY): 25 min  Charges:    $Neuromuscular Re-education: 8-22 mins PT General Charges $$ ACUTE PT VISIT: 1 Visit                     Janell Axe, SPT

## 2023-10-26 NOTE — Procedures (Signed)
 Patient Name: Parker Brown  MRN: [id number]  Epilepsy Attending: Arlin MALVA Krebs  Referring Physician/Provider: Dr Camellia Shark Duration: 10/25/2023 1507 to 10/26/2023 1058  Patient history: 68yo m with ams getting rapid eeg to evaluate for seizure  Level of alertness: Awake, asleep  AEDs during EEG study: None  Technical aspects: This EEG was obtained using a 10 lead EEG system positioned circumferentially without any parasagittal coverage (rapid EEG). Computer selected EEG is reviewed as  well as background features and all clinically significant events.  Description: The posterior dominant rhythm consists of 9 Hz activity of moderate voltage (25-35 uV) seen predominantly in posterior head regions, asymmetric( left<right) and reactive to eye opening and eye closing. Sleep was characterized by vertex waves, sleep spindles (12 to 14 Hz), maximal frontocentral region.  EEG showed continuous 3 to 6 Hz theta-delta slowing in left hemisphere.Hyperventilation and photic stimulation were not performed.     ABNORMALITY - Continuous slow, left hemisphere - Background asymmetry, left<right  IMPRESSION: This limited ceribell EEG study is suggestive of cortical dysfunction arising from left hemisphere likely secondary to underlying structural abnormality. No seizures or epileptiform discharges were seen throughout the recording.  If suspicion for interictal activity remains a concern, a conventional eeg with full montage and video can be considered.   Breydan Shillingburg O Skye Plamondon

## 2023-10-26 NOTE — Progress Notes (Signed)
 NAME:  Parker Brown, MRN:  [id number], DOB:  1955-12-12, LOS: 2 ADMISSION DATE:  10/23/2023,   CHIEF COMPLAINT:  Weakness   History of Present Illness:   68 y.o male with significant PMHx of COPD, polysubstance abuse (cocaine, marijuana, current everyday smoker), HTN, HLD, and arthritis who presented to the ED with chief complaints of right-sided weakness and a fall.   Patient reports acute onset of right sided weakness which caused him to fall while walking to a neighbor's house.  He denied hitting his head, LOC or any other associated symptoms.  Patient admitted to smoking and using cocaine prior to presenting to the ED.  On EMS arrival patient was noted with blood pressure of 204/104.  He has history of hypertension however he admits to not taking his BP medicine on a regular basis   ED Course: Initial vital signs showed HR 67 of beats/minute, BP 186/112 - 214/130 mm Hg, the RR 16 breaths/minute, 98%O2 Sat on RA and Temp of 98.25F (36.7C).    Pertinent Labs/Diagnostics Findings: Unremarkable CBC and CMP.  UDS positive.  Stat noncontrast CT was obtained which showed acute left basal ganglia intraparenchymal hemorrhage with mild edema and minimal mass effect.  Patient was evaluated by teleneurologist who ordered additional imaging with recommendations as noted.  Follow-up CTA head and neck showed no acute intracranial abnormality or LVO.     The patient was then admitted to [facility] service for close monitoring.  Pertinent  Medical History  COPD HTN HLD Arthritis Polysubstance abuse (cocaine, marijuana, tobacco use)  Significant Hospital Events: Including procedures, antibiotic start and stop dates in addition to other pertinent events   10/18: Admitted to ICU with acute left basal ganglia intraparenchymal hemorrhagic stroke iso suspected cocaine induced hypertensive emergency. Headache reported in PM, CT head repeated 10/19: feels more weak in the right side, able to  verbalize and follow commands. Repeat CT head ordered given NIH increased to 15. 10/20 REMAINS HEMIPLEGIC RT SIDE   Interim History / Subjective:  Awake and alert, unable to move his right upper or lower extremity. +RT SIDE HEMIPLEGIA BRAIN DAMAGE FROM COCAINE  Objective    Blood pressure 135/88, pulse 87, temperature 99.2 F (37.3 C), temperature source Oral, resp. rate (!) 22, height 5' 5 (1.651 m), weight 63.7 kg, SpO2 99%.        Intake/Output Summary (Last 24 hours) at 10/26/2023 0756 Last data filed at 10/26/2023 0600 Gross per 24 hour  Intake 789.91 ml  Output 525 ml  Net 264.91 ml   Filed Weights   10/24/23 0500 10/25/23 0400 10/26/23 0545  Weight: 62.9 kg 63.1 kg 63.7 kg      Physical Examination:  General Appearance: No distress  EYES EOM intact.   NECK Supple, No JVD Pulmonary: normal breath sounds, No wheezing.  CardiovascularNormal S1,S2.  No m/r/g.   Ext pulses intact, cap refill intact NEURO-RT SIDED HEMIPLEGIA  ALL OTHER ROS ARE NEGATIVE    Assessment and Plan   68 y.o. male with PMHx of arthritis, COPD, HLD and HTN who presented to the ED on Friday night after a fall. BP was 204/104 BP with HR 77.  CT head in the ED revealed an acute left basal ganglia ICH, most likely hypertensive.   Dx  hypertensive emergency.  Toxicology screen was positive for cocaine.   NEUROLOGY ICH L BASAL GANGLIA BRAIN DAMAGE FORM COCAINE  FOLLOW UP NEURO RECS CERIBEL PLACED AVOID SECONDARY BRAIN INJURY WEANED OFF CLEVIPREX PLAN FOR SD STATUS  AND TRANSFER TO TRH PT CONSULTED holding DVT prophylaxis given hemorrhagic stroke    ENDO - ICU hypoglycemic\Hyperglycemia protocol -check FSBS per protocol   GI GI PROPHYLAXIS as indicated NUTRITIONAL STATUS DIET--> as tolerated Constipation protocol as indicated SPEECH CONSULTED-CLEARED FOR DIET  ELECTROLYTES -follow labs as needed -replace as needed -pharmacy consultation and following  RESTRICTIVE  TRANSFUSION PROTOCOL TRANSFUSION  IF HGB<7  or ACTIVE BLEEDING OR DX of ACUTE CORONARY SYNDROMES   Labs   CBC: Recent Labs  Lab 10/23/23 2345 10/25/23 0426 10/26/23 0439  WBC 3.8* 6.3 6.7  NEUTROABS 2.1 3.7  --   HGB 13.3 15.1 14.3  HCT 40.5 44.7 43.0  MCV 90.0 87.1 87.9  PLT 253 284 257    Basic Metabolic Panel: Recent Labs  Lab 10/23/23 2345 10/25/23 0426 10/26/23 0439  NA 137 137 136  K 3.5 3.2* 3.8  CL 102 101 100  CO2 23 26 21*  GLUCOSE 91 106* 128*  BUN 13 12 15   CREATININE 1.08 1.07 1.20  CALCIUM 9.4 9.2 9.0  MG  --   --  2.0  PHOS  --   --  4.1   GFR: Estimated Creatinine Clearance: 49.8 mL/min (by C-G formula based on SCr of 1.2 mg/dL). Recent Labs  Lab 10/23/23 2345 10/25/23 0426 10/26/23 0439  WBC 3.8* 6.3 6.7    Liver Function Tests: Recent Labs  Lab 10/23/23 2345  AST 26  ALT 10  ALKPHOS 50  BILITOT 0.8  PROT 8.5*  ALBUMIN 4.4   No results for input(s): LIPASE, AMYLASE in the last 168 hours. No results for input(s): AMMONIA in the last 168 hours.  ABG    Component Value Date/Time   HCO3 28.5 (H) 10/19/2022 1221   ACIDBASEDEF 0.4 02/18/2018 0205   O2SAT 54.8 10/19/2022 1221     Coagulation Profile: Recent Labs  Lab 10/23/23 2345  INR 1.0    Cardiac Enzymes: No results for input(s): CKTOTAL, CKMB, CKMBINDEX, TROPONINI in the last 168 hours.  HbA1C: No results found for: HGBA1C  CBG: Recent Labs  Lab 10/25/23 1153 10/25/23 1538 10/25/23 1950 10/25/23 2341 10/26/23 0400  GLUCAP 135* 123* 113* 114* 107*    Review of Systems:   N/A  Past Medical History:  He,  has a past medical history of Arthritis, COPD (chronic obstructive pulmonary disease) (HCC), Hyperlipidemia, and Hypertension.   Surgical History:   Past Surgical History:  Procedure Laterality Date   NO PAST SURGERIES       Social History:   reports that he has been smoking cigarettes. He has never used smokeless tobacco. He reports  current alcohol use. He reports current drug use. Drugs: Marijuana and Crack cocaine.   Family History:  His family history is not on file.   Allergies Allergies  Allergen Reactions   Benzoyl Peroxide Anaphylaxis   Fish Oil Anaphylaxis   Methocarbamol Anaphylaxis   Pineapple Anaphylaxis   Carbomer    Tuberculin, Ppd     Other Reaction(s): Eruption of skin   Tuberculin Purified Protein Derivative Hives and Rash     Home Medications  Prior to Admission medications   Medication Sig Start Date End Date Taking? Authorizing Provider  acetaminophen  (TYLENOL ) 325 MG tablet Take 2 tablets (650 mg total) by mouth every 6 (six) hours as needed for mild pain (pain score 1-3), fever or headache. 06/12/23   Von Bellis, MD  albuterol  (VENTOLIN  HFA) 108 (90 Base) MCG/ACT inhaler Inhale 2 puffs into the lungs every 6 (six)  hours as needed for wheezing or shortness of breath. 06/12/23   Von Bellis, MD  amLODipine  (NORVASC ) 10 MG tablet Take 1 tablet (10 mg total) by mouth daily. Hold if SBP <130 06/12/23 06/11/24  Von Bellis, MD  metoprolol  tartrate (LOPRESSOR ) 25 MG tablet Take 1 tablet (25 mg total) by mouth 2 (two) times daily. Hold if SBP <130 and or HR <65 06/12/23 06/11/24  Von Bellis, MD  polyethylene glycol (MIRALAX  / GLYCOLAX ) 17 g packet Take 17 g by mouth daily as needed for mild constipation. 10/20/22   Dorinda Drue DASEN, MD    I spent a total of 45 minutes dedicated to the care of this patient on the date of this encounter to include pre-visit review of records, face-to-face time with the patient discussing conditions above, post visit ordering of testing, clinical documentation with the electronic health record, making appropriate referrals as documented, and communicating necessary information to the patient's healthcare team.    The Patient requires high complexity decision making for assessment and support, frequent evaluation and titration of therapies, application of advanced monitoring  technologies and extensive interpretation of multiple databases.  Patient satisfied with Plan of action and management. All questions answered    Nickolas Alm Cellar, M.D.  [facility] Pulmonary & Critical Care Medicine  Medical Director [facility] [place]

## 2023-10-26 NOTE — Consult Note (Signed)
 PHARMACY CONSULT NOTE - ELECTROLYTES  Pharmacy Consult for Electrolyte Monitoring and Replacement   Recent Labs: Height: 5' 5 (165.1 cm) Weight: 63.7 kg (140 lb 6.9 oz) IBW/kg (Calculated) : 61.5 Estimated Creatinine Clearance: 49.8 mL/min (by C-G formula based on SCr of 1.2 mg/dL). Potassium (mmol/L)  Date Value  10/26/2023 3.8   Magnesium (mg/dL)  Date Value  89/79/7974 2.0   Calcium (mg/dL)  Date Value  89/79/7974 9.0   Albumin (g/dL)  Date Value  89/82/7974 4.4   Phosphorus (mg/dL)  Date Value  89/79/7974 4.1   Sodium (mmol/L)  Date Value  10/26/2023 136    Assessment  Parker Brown is a 68 y.o. male presenting with right-sided weakness and a fall. PMH significant for COPD, polysubstance abuse (cocaine, marijuana, current everyday smoker), HTN, HLD, and arthritis . Pharmacy has been consulted to monitor and replace electrolytes.  Diet: regular Pertinent medications: losartan 50mg  daily  Goal of Therapy: Electrolytes WNL  Plan:  No electrolyte replacement indicated at this time.  Check BMP, Mg, Phos with AM labs  Thank you for allowing pharmacy to be a part of this patient's care.  Bernardino George, PharmD Candidate [phone] [facility] 10/26/2023 8:35 AM

## 2023-10-26 NOTE — Progress Notes (Signed)
 Eeg done

## 2023-10-26 NOTE — Consult Note (Signed)
 Consultation Note Date: 10/26/2023 at 1030  Patient Name: Parker Brown  DOB: 1955-04-28  MRN: [id number]  Age / Sex: 68 y.o., male  PCP: Pcp, No Referring Physician: Isaiah Scrivener, MD  HPI/Patient Profile: 68 y.o. male  with past medical history of COPD, HTN, HLD, current tobacco use, polysubstance abuse (cocaine, marijuana), PTSD, osteoarthritis with chronic low back pain, diverticulosis, carpal tunnel syndrome, and chronic hep C admitted on 10/23/2023 with right sided weakness.  As per chart review, patient experienced right sided weakness resulting in fall while walking to her neighbor's house.  He was found outside lying on the ground by his sister who subsequently called EMS.   Upon presentation to the ED, patient was hypertensive.  Additionally, RN noted patient was obviously intoxicated (Etoh negative) and endorsed striking his head with no loss of consciousness during fall.  Stat noncontrast CT obtained which revealed acute left basal ganglia intraparenchymal hemorrhage with mild edema and minimal mass effect.  Follow-up CTA of head and neck revealed no acute intracranial abnormalities or LVO.  Patient is being treated for intracranial hemorrhage with hemorrhagic stroke, hypertensive emergency, and cocaine abuse.  PMT was consulted to support patient and family with goals of care discussions.  Clinical Assessment and Goals of Care: Extensive chart review completed prior to meeting patient including labs, vital signs, imaging, progress notes, orders, and available advanced directive documents from current and previous encounters. I then met with patient at bedside to discuss diagnosis prognosis, GOC, EOL wishes, disposition and options.  Patient did not respond to my verbal and light physical stimuli.  He did not open his eyes for the entirety of my visit.  He turned his head to the sound of  my voice but did not engage in discussions.  Patient remains unable to participate in goals of care or medical decision making independently at this time - unclear if this is behavioral or related to recent stroke.  After visiting with the patient, I counseled with attending Dr. Isaiah and RNs Paige/Megan.  No family/friends have called to check on patient and patient has had no visitors since admission.  After communicating with the medical team, I attempted to speak with patient's friend as listed on his demographics.  No answer.  HIPAA compliant voicemail left with PMT contact info given.  No change to plan of care at this time.  Awaiting return phone call from patient's friend to continue goals of care discussions and determine next of kin decision maker.  PMT will continue to follow and support patient throughout his hospitalization.  Primary Decision Maker NEXT OF KIN  Physical Exam Vitals reviewed.  Constitutional:      General: He is not in acute distress.    Appearance: He is normal weight.  HENT:     Head: Normocephalic.     Mouth/Throat:     Mouth: Mucous membranes are moist.  Pulmonary:     Effort: Pulmonary effort is normal.  Abdominal:     Palpations: Abdomen is soft.  Skin:  General: Skin is warm and dry.  Psychiatric:        Behavior: Behavior normal.     Palliative Assessment/Data: 50-60%     Thank you for this consult. Palliative medicine will continue to follow and assist holistically.   75 minute visit includes: Detailed review of medical records (labs, imaging, vital signs), medically appropriate exam (mental status, respiratory, cardiac, skin), discussed with treatment team, counseling and educating patient, family and staff, documenting clinical information, medication management and coordination of care.  Signed by: Lamarr Gunner, DNP, FNP-BC Palliative Medicine   Please contact Palliative Medicine Team providers via [facility] for questions and  concerns.

## 2023-10-26 NOTE — Progress Notes (Signed)
 Patient becoming irritable during NIH assessment. States I do not want to do this shit multiple times. Patient yelling at nurse and on the phone with friend. Patient reassured that assessments are protocol. Patient states to get the hell on when told I would be back to check on him.

## 2023-10-27 DIAGNOSIS — Z515 Encounter for palliative care: Secondary | ICD-10-CM | POA: Diagnosis not present

## 2023-10-27 DIAGNOSIS — I619 Nontraumatic intracerebral hemorrhage, unspecified: Secondary | ICD-10-CM | POA: Diagnosis not present

## 2023-10-27 DIAGNOSIS — I161 Hypertensive emergency: Secondary | ICD-10-CM | POA: Diagnosis not present

## 2023-10-27 DIAGNOSIS — I6389 Other cerebral infarction: Secondary | ICD-10-CM | POA: Diagnosis not present

## 2023-10-27 DIAGNOSIS — R29898 Other symptoms and signs involving the musculoskeletal system: Secondary | ICD-10-CM | POA: Diagnosis not present

## 2023-10-27 LAB — CBC
HCT: 40.5 % (ref 39.0–52.0)
Hemoglobin: 13.5 g/dL (ref 13.0–17.0)
MCH: 29.3 pg (ref 26.0–34.0)
MCHC: 33.3 g/dL (ref 30.0–36.0)
MCV: 88 fL (ref 80.0–100.0)
Platelets: 250 K/uL (ref 150–400)
RBC: 4.6 MIL/uL (ref 4.22–5.81)
RDW: 14.6 % (ref 11.5–15.5)
WBC: 5.1 K/uL (ref 4.0–10.5)
nRBC: 0 % (ref 0.0–0.2)

## 2023-10-27 LAB — BASIC METABOLIC PANEL WITH GFR
Anion gap: 9 (ref 5–15)
BUN: 26 mg/dL — ABNORMAL HIGH (ref 8–23)
CO2: 22 mmol/L (ref 22–32)
Calcium: 8.8 mg/dL — ABNORMAL LOW (ref 8.9–10.3)
Chloride: 104 mmol/L (ref 98–111)
Creatinine, Ser: 1.24 mg/dL (ref 0.61–1.24)
GFR, Estimated: >60 mL/min (ref >60)
Glucose, Bld: 102 mg/dL — ABNORMAL HIGH (ref 70–99)
Potassium: 3.8 mmol/L (ref 3.5–5.1)
Sodium: 135 mmol/L (ref 135–145)

## 2023-10-27 LAB — GLUCOSE, CAPILLARY
Glucose-Capillary: 111 mg/dL — ABNORMAL HIGH (ref 70–99)
Glucose-Capillary: 102 mg/dL — ABNORMAL HIGH (ref 70–99)
Glucose-Capillary: 102 mg/dL — ABNORMAL HIGH (ref 70–99)

## 2023-10-27 LAB — MAGNESIUM: Magnesium: 2.4 mg/dL (ref 1.7–2.4)

## 2023-10-27 LAB — PHOSPHORUS: Phosphorus: 5.5 mg/dL — ABNORMAL HIGH (ref 2.5–4.6)

## 2023-10-27 MED ORDER — CHLORHEXIDINE GLUCONATE CLOTH 2 % EX PADS: 6.0000 | MEDICATED_PAD | Freq: Every day | CUTANEOUS | Status: DC

## 2023-10-27 MED ORDER — TRAZODONE HCL 50 MG PO TABS
25.0000 mg | ORAL_TABLET | Freq: Every evening | ORAL | Status: DC | PRN
  Administered 2023-10-27 – 2023-11-01 (×5): 25 mg via ORAL
  Filled 2023-10-27 (×6): qty 1

## 2023-10-27 MED ORDER — LOSARTAN POTASSIUM 50 MG PO TABS
100.0000 mg | ORAL_TABLET | Freq: Every day | ORAL | Status: DC
  Administered 2023-10-27 – 2023-11-10 (×15): 100 mg via ORAL
  Filled 2023-10-27 (×15): qty 2

## 2023-10-27 NOTE — Plan of Care (Signed)

## 2023-10-27 NOTE — TOC Progression Note (Signed)
 Transition of Care [facility]) - Progression Note    Patient Details  Name: Parker Brown MRN: [id number] Date of Birth: 1955-11-01  [facility] of [facility]) CM/SW Contact  K'La JINNY Ruts, LCSW Phone Number: 10/27/2023, 2:56 PM  Clinical Narrative:    Chart reviewed. Wellness check report made with [place] police department. The patient provided me with the address of [address] [place]. I have provided the dispatcher with my contact information for a follow up. There are no other TOC needs at this time.      Barriers to Discharge: Continued Medical Work up               Expected Discharge Plan and Services       Living arrangements for the past 2 months: Single Family Home                                       Social Drivers of Health (SDOH) Interventions SDOH Screenings   Food Insecurity: No Food Insecurity (10/24/2023)  Housing: Low Risk  (10/24/2023)  Transportation Needs: No Transportation Needs (10/24/2023)  Utilities: Not At Risk (10/24/2023)  Social Connections: Socially Isolated (10/24/2023)  Tobacco Use: High Risk (10/23/2023)    Readmission Risk Interventions    10/27/2023   11:45 AM  Readmission Risk Prevention Plan  Transportation Screening Complete  PCP or Specialist Appt within 5-7 Days Complete  Medication Review (RN CM) Complete

## 2023-10-27 NOTE — Progress Notes (Signed)
 Physical Therapy Treatment Patient Details Name: Parker Brown MRN: [id number] DOB: 1955/09/18 Today's Date: 10/27/2023   History of Present Illness Patient is a 68 year old male with fall, acute left basal ganglia ICH, hypertensive emergency. Repeat head CT stable. PMH: hypertension and polysubstance abuse    PT Comments  Patient is more alert and interactive today. Increased participation with transfers this session. Some weight acceptance noted in the right leg during transfers. After weight bearing and mobility facilitation, patient does demonstrate increase movement in the right leg, specifically the quad with knee extension 2-/5. Trace RUE movement noted also. Recommend to continue PT to maximize independence and to decrease caregiver burden. Consider rehabilitation > 3 hours/day after this hospital stay.    If plan is discharge home, recommend the following: Two people to help with walking and/or transfers;Two people to help with bathing/dressing/bathroom;Assistance with cooking/housework;Direct supervision/assist for medications management;Direct supervision/assist for financial management;Assist for transportation;Help with stairs or ramp for entrance;Supervision due to cognitive status   Can travel by private vehicle        Equipment Recommendations   (TBD)    Recommendations for Other Services Rehab consult     Precautions / Restrictions Precautions Precautions: Fall Recall of Precautions/Restrictions: Impaired Precaution/Restrictions Comments: SBP< 150 Restrictions Weight Bearing Restrictions Per Provider Order: No     Mobility  Bed Mobility Overal bed mobility: Needs Assistance Bed Mobility: Supine to Sit, Sit to Supine     Supine to sit: Min assist, +2 for physical assistance Sit to supine: Min assist, +2 for physical assistance   General bed mobility comments: facilitation for right hemibody. increased time required    Transfers Overall transfer  level: Needs assistance Equipment used: 2 person hand held assist Transfers: Bed to chair/wheelchair/BSC   Stand pivot transfers: Mod assist, +2 physical assistance         General transfer comment: increased independence and coordination with transfers with faciliation for hip extension. patient able to pivot from bed to bed side commode and back to bed. right knee blocked intermittently but right leg does appear to accept some weight today compared to prior standing attempt    Ambulation/Gait                   Stairs             Wheelchair Mobility     Tilt Bed    Modified Rankin (Stroke Patients Only)       Balance Overall balance assessment: Needs assistance Sitting-balance support: Feet supported Sitting balance-Leahy Scale: Poor Sitting balance - Comments: intermittent extenal support provided with dynamic activity. for static sitting, patient able to maintain midline without external support Postural control: Right lateral lean, Posterior lean Standing balance support: Bilateral upper extremity supported Standing balance-Leahy Scale: Zero Standing balance comment: external support required to maintain standing                            Communication Communication Communication: Impaired Factors Affecting Communication: Difficulty expressing self  Cognition Arousal: Alert Behavior During Therapy: Restless, Flat affect, Impulsive   PT - Cognitive impairments: No family/caregiver present to determine baseline, Memory, Orientation, Safety/Judgement, Sequencing, Initiation, Problem solving                       PT - Cognition Comments: mutli modal cues required Following commands: Impaired Following commands impaired: Follows one step commands with increased time    Cueing  Cueing Techniques: Verbal cues, Gestural cues, Tactile cues, Visual cues  Exercises      General Comments General comments (skin integrity, edema, etc.):  after standing, patient has increased movement in the right arm and right leg. patient is able to complete partial LAQ with RLE (2-/5 knee extension)      Pertinent Vitals/Pain Pain Assessment Pain Assessment: No/denies pain    Home Living                          Prior Function            PT Goals (current goals can now be found in the care plan section) Acute Rehab PT Goals Patient Stated Goal: none stated Time For Goal Achievement: 11/08/23 Potential to Achieve Goals: Fair Progress towards PT goals: Progressing toward goals    Frequency    Min 3X/week      PT Plan      Co-evaluation PT/OT/SLP Co-Evaluation/Treatment: Yes Reason for Co-Treatment: Complexity of the patient's impairments (multi-system involvement);For patient/therapist safety PT goals addressed during session: Mobility/safety with mobility        AM-PAC PT 6 Clicks Mobility   Outcome Measure  Help needed turning from your back to your side while in a flat bed without using bedrails?: A Little Help needed moving from lying on your back to sitting on the side of a flat bed without using bedrails?: A Lot Help needed moving to and from a bed to a chair (including a wheelchair)?: A Lot Help needed standing up from a chair using your arms (e.g., wheelchair or bedside chair)?: A Lot Help needed to walk in hospital room?: Total Help needed climbing 3-5 steps with a railing? : Total 6 Click Score: 11    End of Session   Activity Tolerance: Patient tolerated treatment well Patient left: in bed;with call bell/phone within reach Nurse Communication: Mobility status PT Visit Diagnosis: Hemiplegia and hemiparesis;Other abnormalities of gait and mobility (R26.89) Hemiplegia - Right/Left: Right Hemiplegia - dominant/non-dominant: Dominant Hemiplegia - caused by: Nontraumatic intracerebral hemorrhage     Time: 1004-1030 PT Time Calculation (min) (ACUTE ONLY): 26 min  Charges:     $Therapeutic Activity: 8-22 mins PT General Charges $$ ACUTE PT VISIT: 1 Visit                    Randine Essex, PT, MPT    Randine LULLA Essex 10/27/2023, 12:13 PM

## 2023-10-27 NOTE — TOC Initial Note (Signed)
 Transition of Care [facility]) - Initial/Assessment Note    Patient Details  Name: Parker Brown MRN: [id number] Date of Birth: Oct 16, 1955  Transition of Care [facility]) CM/SW Contact:    Parker JINNY Ruts, LCSW Phone Number: 10/27/2023, 11:54 AM  Clinical Narrative:                 Chart reviewed. The patient was admitted for Acute Hemorrhagic Infraction. I was consulted for Substance Use and PCP resources. I was able to speak with the patient at bedside today. The patient reports that he was doing well. The patient reports that he does not have a PCP but would like PCP resources.   The patient reports that he lives with his sister, Parker Brown. The patient reports that he was able to complete his daily living task independently before been admitted to the hospital. The patient reports that he would take himself to medical appointments.   The patient reports that he does not know who will help him at discharge. The patient reports that he is trying to get in contact with his sister and his wife. The patient would like a wellness check to be put in place. The patient reports that no one has came to see him. The patient reports that [facility] uses the [facility].   The patient reports that he has never had HH or been admitted into a SNF in the past. The patient reports that he has a walker because he has bad knees.   I will add PCP resources to the patient AVS and SUD resources.   There are no other TOC needs at this time. I will try to contact local family members. The number in the patient chart is not working. TOC will follow the patient until D/C .   Barriers to Discharge: Continued Medical Work up   Patient Goals and CMS Choice            Expected Discharge Plan and Services       Living arrangements for the past 2 months: Single Family Home                                      Prior Living Arrangements/Services Living arrangements for the past 2 months: Single  Family Home Lives with:: Siblings Patient language and need for interpreter reviewed:: Yes        Need for Family Participation in Patient Care: Yes (Comment)     Criminal Activity/Legal Involvement Pertinent to Current Situation/Hospitalization: No - Comment as needed  Activities of Daily Living   ADL Screening (condition at time of admission) Independently performs ADLs?: Yes (appropriate for developmental age) Is the patient deaf or have difficulty hearing?: No Does the patient have difficulty seeing, even when wearing glasses/contacts?: No Does the patient have difficulty concentrating, remembering, or making decisions?: No  Permission Sought/Granted   Permission granted to share information with : Yes, Verbal Permission Granted        Permission granted to share info w Relationship: Sister and Wife  Permission granted to share info w Solicitor Information: [employer] (No contact number for the sister but contact for the wife in the patient chart.)  Emotional Assessment Appearance:: Appears stated age Attitude/Demeanor/Rapport: Engaged Affect (typically observed): Frustrated, Agitated Orientation: : Oriented to Self, Oriented to Situation, Oriented to Place, Oriented to  Time Alcohol / Substance Use: Alcohol Use, Illicit  Drugs Psych Involvement: No (comment)  Admission diagnosis:  Weakness of right upper extremity [R29.898] Intraparenchymal hemorrhage of brain (HCC) [I61.9] Acute hemorrhagic infarction of brain (HCC) [I63.89] Weakness of right lower extremity [R29.898] Patient Active Problem List   Diagnosis Date Noted   Acute hemorrhagic infarction of brain (HCC) 10/24/2023   Intraparenchymal hemorrhage of brain (HCC) 10/24/2023   Hypertensive emergency 10/24/2023   Posttraumatic stress disorder 10/19/2022   Osteoarthritis 10/19/2022   Chronic low back pain 10/19/2022   Diarrhea 10/19/2022   Diverticulosis of colon 10/19/2022   Chronic  hepatitis C (HCC) 10/19/2022   History of tobacco use 03/15/2022   Polysubstance abuse (HCC) 03/15/2022   Insomnia 03/15/2022   Musculoskeletal chest pain 03/15/2022   COPD exacerbation (HCC) 01/10/2018   COPD with acute exacerbation (HCC) 06/09/2017   HTN (hypertension) 06/09/2017   HLD (hyperlipidemia) 06/09/2017   Carpal tunnel syndrome 01/07/1999   PCP:  Pcp, No Pharmacy:   [name] [place], [place] - [address] [place] [address] Phone: [phone] Fax: [phone]  [facility] [place], [place] - [address] [place] [address] Phone: [phone] Fax: [phone]  [facility] [address] [place] - [address] [place] [address] Phone: [phone] Fax: [phone]     Social Drivers of Health (SDOH) Social History: SDOH Screenings   Food Insecurity: No Food Insecurity (10/24/2023)  Housing: Low Risk  (10/24/2023)  Transportation Needs: No Transportation Needs (10/24/2023)  Utilities: Not At Risk (10/24/2023)  Social Connections: Socially Isolated (10/24/2023)  Tobacco Use: High Risk (10/23/2023)   SDOH Interventions:     Readmission Risk Interventions    10/27/2023   11:45 AM  Readmission Risk Prevention Plan  Transportation Screening Complete  PCP or Specialist Appt within 5-7 Days Complete  Medication Review (RN CM) Complete

## 2023-10-27 NOTE — Plan of Care (Signed)
  Problem: Education: Goal: Knowledge of disease or condition will improve Outcome: Not Progressing   Problem: Intracerebral Hemorrhage Tissue Perfusion: Goal: Complications of Intracerebral Hemorrhage will be minimized Outcome: Progressing   Problem: Health Behavior/Discharge Planning: Goal: Ability to manage health-related needs will improve Outcome: Progressing   Problem: Self-Care: Goal: Ability to communicate needs accurately will improve Outcome: Progressing   Problem: Nutrition: Goal: Risk of aspiration will decrease Outcome: Progressing Goal: Dietary intake will improve Outcome: Progressing   Problem: Clinical Measurements: Goal: Respiratory complications will improve Outcome: Progressing Goal: Cardiovascular complication will be avoided Outcome: Progressing   Problem: Elimination: Goal: Will not experience complications related to bowel motility Outcome: Progressing

## 2023-10-27 NOTE — Progress Notes (Signed)
 Patient admitted to room 121B transfer from ICU, accompanied by RN and sister at bedside. Patient oriented to room and call light. No apparent distress noted at this time will continue to monitor

## 2023-10-27 NOTE — Discharge Instructions (Signed)
 Some PCP options in [place] area- not a comprehensive list  [facility]- [phone] [facility]- [phone] [facility]- [phone] [facility]- [phone] [facility]- [phone] Nichole Molly- [phone]  or [facility] Physician Referral Line [phone]

## 2023-10-27 NOTE — Progress Notes (Signed)
 PROGRESS NOTE    Parker Brown   FMW:[id number] DOB: 1955-03-05  DOA: 10/23/2023 Date of Service: 10/27/23 which is hospital day 3  PCP: Pcp, No    Hospital course / significant events:   68 y.o male with significant PMHx of COPD, polysubstance abuse (cocaine, marijuana, current everyday smoker), HTN, HLD, and arthritis who presented to the ED with chief complaints of right-sided weakness and a fall.   HPI: acute onset of right sided weakness which caused him to fall while walking to a neighbor's house. He denied hitting his head, LOC or any other associated symptoms. Patient admitted to smoking and using cocaine prior to presenting to the ED. On EMS arrival patient was noted with blood pressure of 204/104. 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 Headache reported in PM, CT head repeated 10/19: feels more weak in the right side, able to verbalize and follow commands. Repeat CT head ordered given NIH increased to 15. 10/20 persistent R hemiplegia. Weaned off cleviprex and ok to transfer to hospitalist. Plan placement for  inpatient rehab  10/21: EEG done pending read. If no concerns there can dc, if epileptiform can d/w neiro re: Rx but should not hold up dc      Consultants:  [facility] initially admitted to ICU Neurology Palliative care   Procedures/Surgeries: none      ASSESSMENT & PLAN:   ICH L basal ganglia  Brain damage cocaine related  Residual deficits to R arm/leg Neurology follow up  No antiplatelet medications or anticoagulants Repeat CT head if he worsens.  EEG done pending read. If no concerns there can dc, if epileptiform can d/w neiro re: Rx but should not hold up dc   Cocaine induced hypertensive emergency  Resolved Amlodipine  10 mg daily   hydralazine  25 mg po q6h --> dc expect this will be difficult to adhere to on eventual discharge  Losartan 50 mg daily --> 100 mg daily since taking off hydralazine  qid  Hydralazine  IV prn elevated BP No beta blocker in cocaine use   EtOH use CIWA but this is improving, can dc    No concerns based on BMI: Body mass index is 23.3 kg/m.SABRA Significantly low or high BMI is associated with higher medical risk.  Underweight - under 18  overweight - 25 to 29 obese - 30 or more Class 1 obesity: BMI of 30.0 to 34 Class 2 obesity: BMI of 35.0 to 39 Class 3 obesity: BMI of 40.0 to 49 Super Morbid Obesity: BMI 50-59 Super-super Morbid Obesity: BMI 60+ Healthy nutrition and physical activity advised as adjunct to other disease management and risk reduction treatments    DVT prophylaxis: SCD IV fluids: no continuous IV fluids  Nutrition: regular Central lines / other devices: none  Code Status: FULL ACP documentation reviewed: none on file in [facility]  TOC needs: placement Medical barriers to dispo: none.              Subjective / Brief ROS:  Patient reports feeling ok today no complaints  Denies CP/SOB.  Pain controlled.  Denies new weakness.  Tolerating diet.  Reports no concerns w/ urination/defecation.   Family Communication: none at this time     Objective Findings:  Vitals:   10/27/23 1000 10/27/23 1100 10/27/23 1200 10/27/23 1353  BP: 108/65 132/82  121/82  Pulse: 91 71 88 84  Resp: 20 19 (!) 22 18  Temp:    97.9 F (36.6 C)  TempSrc:    Oral  SpO2: 98% 99% 99% 97%  Weight:      Height:        Intake/Output Summary (Last 24 hours) at 10/27/2023 1846 Last data filed at 10/27/2023 1310 Gross per 24 hour  Intake 480 ml  Output 1650 ml  Net -1170 ml   Filed Weights   10/25/23 0400 10/26/23 0545 10/27/23 0400  Weight: 63.1 kg 63.7 kg 63.5 kg    Examination:  Physical Exam Constitutional:      General: He is not in acute  distress. Cardiovascular:     Rate and Rhythm: Normal rate and regular rhythm.  Pulmonary:     Effort: Pulmonary effort is normal.     Breath sounds: Normal breath sounds.  Musculoskeletal:     Right lower leg: No edema.     Left lower leg: No edema.  Neurological:     Mental Status: He is alert.     Comments: Weakness R upper and lower extremities           Scheduled Medications:   amLODipine   10 mg Oral Daily   Chlorhexidine Gluconate Cloth  6 each Topical Daily   folic acid  1 mg Oral Daily   insulin aspart  0-9 Units Subcutaneous TID WC   losartan  100 mg Oral Daily   multivitamin with minerals  1 tablet Oral Daily   senna-docusate  1 tablet Oral BID   thiamine  100 mg Oral Daily   Or   thiamine  100 mg Intravenous Daily    Continuous Infusions:   PRN Medications:  acetaminophen  **OR** acetaminophen  (TYLENOL ) oral liquid 160 mg/5 mL **OR** acetaminophen , docusate sodium , hydrALAZINE , LORazepam  **OR** LORazepam , polyethylene glycol  Antimicrobials from admission:  Anti-infectives (From admission, onward)    None           Data Reviewed:  I have personally reviewed the following...  CBC: Recent Labs  Lab 10/23/23 2345 10/25/23 0426 10/26/23 0439 10/27/23 0349  WBC 3.8* 6.3 6.7 5.1  NEUTROABS 2.1 3.7  --   --   HGB 13.3 15.1 14.3 13.5  HCT 40.5 44.7 43.0 40.5  MCV 90.0 87.1 87.9 88.0  PLT 253 284 257 250   Basic Metabolic Panel: Recent Labs  Lab 10/23/23 2345 10/25/23 0426 10/26/23 0439 10/27/23 0349  NA 137 137 136 135  K 3.5 3.2* 3.8 3.8  CL 102 101 100 104  CO2 23 26 21* 22  GLUCOSE 91 106* 128* 102*  BUN 13 12 15  26*  CREATININE 1.08 1.07 1.20 1.24  CALCIUM 9.4 9.2 9.0 8.8*  MG  --   --  2.0 2.4  PHOS  --   --  4.1 5.5*   GFR: Estimated Creatinine Clearance: 48.2 mL/min (by C-G formula based on SCr of 1.24 mg/dL). Liver Function Tests: Recent Labs  Lab 10/23/23 2345  AST 26  ALT 10  ALKPHOS 50  BILITOT 0.8  PROT 8.5*   ALBUMIN 4.4   No results for input(s): LIPASE, AMYLASE in the last 168 hours. No results for input(s): AMMONIA in the last 168 hours. Coagulation Profile: Recent Labs  Lab 10/23/23 2345  INR 1.0   Cardiac Enzymes: No results for input(s): CKTOTAL, CKMB, CKMBINDEX, TROPONINI in the last 168 hours. BNP (last 3 results) No results for input(s): PROBNP in the last 8760 hours. HbA1C: No  results for input(s): HGBA1C in the last 72 hours. CBG: Recent Labs  Lab 10/26/23 1702 10/26/23 1909 10/27/23 0721 10/27/23 1107 10/27/23 1651  GLUCAP 104* 133* 111* 102* 102*   Lipid Profile: Recent Labs    10/25/23 0426  TRIG 155*   Thyroid Function Tests: No results for input(s): TSH, T4TOTAL, FREET4, T3FREE, THYROIDAB in the last 72 hours. Anemia Panel: No results for input(s): VITAMINB12, FOLATE, FERRITIN, TIBC, IRON, RETICCTPCT in the last 72 hours. Most Recent Urinalysis On File:  No results found for: COLORURINE, APPEARANCEUR, LABSPEC, PHURINE, GLUCOSEU, HGBUR, BILIRUBINUR, KETONESUR, PROTEINUR, UROBILINOGEN, NITRITE, LEUKOCYTESUR Sepsis Labs: @LABRCNTIP (procalcitonin:4,lacticidven:4) Microbiology: Recent Results (from the past 240 hours)  MRSA Next Gen by PCR, Nasal     Status: None   Collection Time: 10/24/23  2:56 AM   Specimen: Nasal Mucosa; Nasal Swab  Result Value Ref Range Status   MRSA by PCR Next Gen NOT DETECTED NOT DETECTED Final    Comment: (NOTE) The GeneXpert MRSA Assay (FDA approved for NASAL specimens only), is one component of a comprehensive MRSA colonization surveillance program. It is not intended to diagnose MRSA infection nor to guide or monitor treatment for MRSA infections. Test performance is not FDA approved in patients less than 77 years old. Performed at [facility], [address] [place], [place] [address]       Radiology Studies last 3 days: MR BRAIN WO  CONTRAST Result Date: 10/25/2023 EXAM: MRI BRAIN WITHOUT CONTRAST 10/25/2023 01:05:00 PM TECHNIQUE: Multiplanar multisequence MRI of the head/brain was performed without the administration of intravenous contrast. COMPARISON: Same day CT head. CLINICAL HISTORY: Stroke, follow up. AMS; Latest CT: Stable intraparenchymal hemorrhage within the left basal ganglia and external capsule, measuring approximately 2.4 x 1.6 x 3.2 cm, with mild surrounding edema. No evidence of interval hemorrhage. FINDINGS: BRAIN AND VENTRICLES: Prominent region of signal abnormality and susceptibility within the left external capsule and corona radiata measuring 2.8 x 1.2 cm compatible with intraparenchymal hemorrhage seen on same day CT. There is surrounding edema within the posterior aspect of the left corona radiata and within the left external capsule extending into the subinsular region with edema also extending into the left temporal white matter. Mild local mass effect. There is partial effacement of the atrium and temporal horn of the left lateral ventricle. No significant midline shift. No hydrocephalus. Moderate chronic microvascular ischemic changes. There is a region of restricted diffusion in the left corona radiata extending into the centrum semiovale adjacent to the region of hemorrhage compatible with infarct. The sella is unremarkable. Normal flow voids. ORBITS: Rightward gaze deviation. SINUSES AND MASTOIDS: Mild scattered mucosal thickening in the ethmoid sinuses and right maxillary sinus. BONES AND SOFT TISSUES: Normal marrow signal. No acute soft tissue abnormality. IMPRESSION: 1. Acute hemorrhagic infarct involving the left centrum semiovale, left corona radiata, and external capsule with partial involvement of the posterior left lentiform nucleus. Similar appearance of intraparenchymal hematoma. 2. Surrounding edema and mild local mass effect. No significant midline shift. 3. Moderate chronic microvascular ischemic  changes. Electronically signed by: Donnice Mania MD 10/25/2023 02:07 PM EDT RP Workstation: [id number]   DG Abd 1 View Result Date: 10/25/2023 CLINICAL DATA:  MRI screening for metallic foreign body. EXAM: ABDOMEN - 1 VIEW COMPARISON:  None Available. FINDINGS: No metallic foreign bodies identified. The bowel gas pattern is normal. No radio-opaque calculi or other significant radiographic abnormality are seen. Pelvic phleboliths incidentally noted. IMPRESSION: No metallic foreign bodies identified.  Normal bowel gas pattern. Electronically Signed  By: Norleen DELENA Kil M.D.   On: 10/25/2023 11:56   DG Chest Port 1 View Result Date: 10/25/2023 CLINICAL DATA:  MRI screening for metallic foreign body. EXAM: PORTABLE CHEST 1 VIEW COMPARISON:  06/08/2023 FINDINGS: No metallic foreign bodies seen. The heart size and mediastinal contours are within normal limits. Both lungs are clear. The visualized skeletal structures are unremarkable. IMPRESSION: No active disease. No metallic foreign body identified. Electronically Signed   By: Norleen DELENA Kil M.D.   On: 10/25/2023 11:54   CT HEAD WO CONTRAST ( ) Result Date: 10/25/2023 EXAM: CT HEAD WITHOUT CONTRAST 10/25/2023 09:33:46 AM TECHNIQUE: CT of the head was performed without the administration of intravenous contrast. Automated exposure control, iterative reconstruction, and/or weight based adjustment of the mA/kV was utilized to reduce the radiation dose to as low as reasonably achievable. COMPARISON: CT of the head dated 10/24/2023. CLINICAL HISTORY: Mental status change, unknown cause; Neuro deficit, acute, stroke suspected; ICH, rising NIH score. ICH followup. FINDINGS: BRAIN AND VENTRICLES: The intraparenchymal hemorrhage previously seen within the left basal ganglia and external capsule has remained stable in size measuring approximately 2.4 x 1.6 x 3.2 cm. There is mild surrounding edema. There is no evidence of interval hemorrhage. There is mild periventricular  white matter disease. No evidence of acute infarct. No hydrocephalus. No extra-axial collection. No mass effect or midline shift. ORBITS: No acute abnormality. SINUSES: No acute abnormality. SOFT TISSUES AND SKULL: No acute soft tissue abnormality. No skull fracture. IMPRESSION: 1. Stable intraparenchymal hemorrhage within the left basal ganglia and external capsule, measuring approximately 2.4 x 1.6 x 3.2 cm, with mild surrounding edema. No evidence of interval hemorrhage. 2. Mild periventricular white matter disease. Electronically signed by: Evalene Coho MD 10/25/2023 09:39 AM EDT RP Workstation: [id number]   CT HEAD WO CONTRAST ( ) Result Date: 10/24/2023 CLINICAL DATA:  Known intracranial hemorrhage EXAM: CT HEAD WITHOUT CONTRAST TECHNIQUE: Contiguous axial images were obtained from the base of the skull through the vertex without intravenous contrast. RADIATION DOSE REDUCTION: This exam was performed according to the departmental dose-optimization program which includes automated exposure control, adjustment of the mA and/or kV according to patient size and/or use of iterative reconstruction technique. COMPARISON:  10/24/2023 FINDINGS: Brain: Stable acute left basal ganglia intraparenchymal hemorrhage, measuring 3.1 x 2.5 x 1.8 cm by my measurement. Minimal surrounding edema wall, without significant mass effect or midline shift. There are no new areas of hemorrhage or infarct. Lateral ventricles and remaining midline structures are unremarkable. No acute extra-axial fluid collections. Vascular: No hyperdense vessel or unexpected calcification. Skull: Normal. Negative for fracture or focal lesion. Sinuses/Orbits: No acute finding. Other: None. IMPRESSION: 1. Stable left basal ganglia acute intraparenchymal hemorrhage, with minimal surrounding edema. No significant mass effect or midline shift. 2. No new areas of hemorrhage or infarct. Electronically Signed   By: Ozell Daring M.D.   On: 10/24/2023  19:06   ECHOCARDIOGRAM COMPLETE Result Date: 10/24/2023    ECHOCARDIOGRAM REPORT   Patient Name:   Parker Brown Date of Exam: 10/24/2023 Medical Rec #:  [id number]                 Height:       65.0 in Accession #:    [id number]                Weight:       138.7 lb Date of Birth:  June 07, 1955  BSA:          1.693 m Patient Age:    68 years                  BP:           138/87 mmHg Patient Gender: M                         HR:           87 bpm. Exam Location:  [facility] Procedure: 2D Echo, Cardiac Doppler, Color Doppler and Strain Analysis (Both            Spectral and Color Flow Doppler were utilized during procedure). Indications:     Stroke I63.9  History:         Patient has no prior history of Echocardiogram examinations.  Sonographer:     Thedora Louder RDCS, FASE Referring Phys:  [id number] ALMARIE BAKE OUMA Diagnosing Phys: Cara JONETTA Lovelace MD  Sonographer Comments: Global longitudinal strain was attempted. Challenging study due to both low acoustic windows and chest wall/ lung interference. IMPRESSIONS  1. Left ventricular ejection fraction, by estimation, is 60 to 65%. The left ventricle has normal function. The left ventricle has no regional wall motion abnormalities. There is mild concentric left ventricular hypertrophy. Left ventricular diastolic parameters are consistent with Grade I diastolic dysfunction (impaired relaxation). The average left ventricular global longitudinal strain is 22.7 %. The global longitudinal strain is normal.  2. Right ventricular systolic function is normal. The right ventricular size is normal.  3. The mitral valve is normal in structure. Trivial mitral valve regurgitation.  4. The aortic valve is normal in structure. Aortic valve regurgitation is not visualized. Aortic valve sclerosis is present, with no evidence of aortic valve stenosis. FINDINGS  Left Ventricle: Left ventricular ejection fraction, by estimation, is 60 to 65%. The left  ventricle has normal function. The left ventricle has no regional wall motion abnormalities. The average left ventricular global longitudinal strain is 22.7 %. Strain was performed and the global longitudinal strain is normal. The left ventricular internal cavity size was normal in size. There is mild concentric left ventricular hypertrophy. Left ventricular diastolic parameters are consistent with Grade I diastolic dysfunction (impaired relaxation). Right Ventricle: The right ventricular size is normal. No increase in right ventricular wall thickness. Right ventricular systolic function is normal. Left Atrium: Left atrial size was normal in size. Right Atrium: Right atrial size was normal in size. Pericardium: There is no evidence of pericardial effusion. Mitral Valve: The mitral valve is normal in structure. Trivial mitral valve regurgitation. Tricuspid Valve: The tricuspid valve is normal in structure. Tricuspid valve regurgitation is mild. Aortic Valve: The aortic valve is normal in structure. Aortic valve regurgitation is not visualized. Aortic valve sclerosis is present, with no evidence of aortic valve stenosis. Aortic valve peak gradient measures 8.4 mmHg. Pulmonic Valve: The pulmonic valve was normal in structure. Pulmonic valve regurgitation is not visualized. Aorta: The ascending aorta was not well visualized. IAS/Shunts: No atrial level shunt detected by color flow Doppler. Additional Comments: 3D was performed not requiring image post processing on an independent workstation and was indeterminate.  LEFT VENTRICLE PLAX 2D LVIDd:         4.70 cm   Diastology LVIDs:         3.10 cm   LV e' medial:    5.44 cm/s LV PW:         1.10  cm   LV E/e' medial:  11.8 LV IVS:        1.20 cm   LV e' lateral:   6.53 cm/s LVOT diam:     2.10 cm   LV E/e' lateral: 9.8 LV SV:         69 LV SV Index:   41        2D Longitudinal Strain LVOT Area:     3.46 cm  2D Strain GLS (A4C):   21.0 %                          2D Strain  GLS (A3C):   17.6 %                          2D Strain GLS (A2C):   29.4 %                          2D Strain GLS Avg:     22.7 % RIGHT VENTRICLE RV Basal diam:  3.80 cm RV S prime:     15.40 cm/s TAPSE (M-mode): 2.6 cm LEFT ATRIUM             Index        RIGHT ATRIUM           Index LA diam:        3.30 cm 1.95 cm/m   RA Area:     14.40 cm LA Vol (A2C):   9.7 ml  5.72 ml/m   RA Volume:   32.30 ml  19.08 ml/m LA Vol (A4C):   22.7 ml 13.41 ml/m LA Biplane Vol: 15.4 ml 9.10 ml/m  AORTIC VALVE AV Area (Vmax): 2.63 cm AV Vmax:        145.00 cm/s AV Peak Grad:   8.4 mmHg LVOT Vmax:      110.00 cm/s LVOT Vmean:     70.100 cm/s LVOT VTI:       0.198 m  AORTA Ao Root diam: 4.00 cm Ao Asc diam:  4.70 cm MITRAL VALVE MV Area (PHT): 3.93 cm    SHUNTS MV Decel Time: 193 msec    Systemic VTI:  0.20 m MV E velocity: 64.30 cm/s  Systemic Diam: 2.10 cm MV A velocity: 94.30 cm/s MV E/A ratio:  0.68 Dwayne D Callwood MD Electronically signed by Cara JONETTA Lovelace MD Signature Date/Time: 10/24/2023/2:23:55 PM    Final    CT HEAD WO CONTRAST ( ) Result Date: 10/24/2023 EXAM: CT HEAD WITHOUT CONTRAST 10/24/2023 06:19:16 AM TECHNIQUE: CT of the head was performed without the administration of intravenous contrast. Automated exposure control, iterative reconstruction, and/or weight based adjustment of the mA/kV was utilized to reduce the radiation dose to as low as reasonably achievable. COMPARISON: 10/23/2023 CLINICAL HISTORY: Neuro deficit, acute, stroke suspected. FINDINGS: BRAIN AND VENTRICLES: Stable acute intraparenchymal hemorrhage in left basal ganglia measuring 2.5 x 1.7 x 3.2 cm. Mild surrounding edema with local mass effect. Patchy white matter hypodensities compatible with chronic microvascular ischemic disease. No evidence of acute infarct. No hydrocephalus. No extra-axial collection. No midline shift. ORBITS: No acute abnormality. SINUSES: No acute abnormality. SOFT TISSUES AND SKULL: No acute soft tissue  abnormality. No skull fracture. IMPRESSION: 1. Stable acute intraparenchymal hemorrhage in left basal ganglia measuring 2.5 x 1.7 x 3.2 cm, with mild surrounding edema and local mass effect. 2. Patchy white matter hypodensities compatible  with chronic microvascular ischemic disease. Electronically signed by: Evalene Coho MD 10/24/2023 06:23 AM EDT RP Workstation: [id number]   CT ANGIO HEAD NECK W WO CM W PERF (CODE STROKE) Result Date: 10/24/2023 EXAM: CTA Head and Neck with Perfusion 10/24/2023 01:41:45 AM TECHNIQUE: CTA of the head and neck was performed without and with the administration of 125 mL of iohexol (OMNIPAQUE) 350 MG/ML injection. 3D postprocessing with multiplanar reconstructions and MIPs was performed to evaluate the vascular anatomy. Cerebral perfusion analysis using computed tomography with contrast administration, including post-processing of parametric maps with determination of cerebral blood flow, cerebral blood volume, mean transit time and time-to-maximum. Automated exposure control, iterative reconstruction, and/or weight based adjustment of the mA/kV was utilized to reduce the radiation dose to as low as reasonably achievable. COMPARISON: CT from earlier the same day. CLINICAL HISTORY: Neuro deficit, acute, stroke suspected. FINDINGS: CTA NECK: AORTIC ARCH AND ARCH VESSELS: No dissection or arterial injury. No significant stenosis of the brachiocephalic or subclavian arteries. CERVICAL CAROTID ARTERIES: No dissection, arterial injury, or hemodynamically significant stenosis by NASCET criteria. CERVICAL VERTEBRAL ARTERIES: No dissection, arterial injury, or significant stenosis. LUNGS AND MEDIASTINUM: Unremarkable. SOFT TISSUES: 1.5 cm subcutaneous lipoma noted at the subcutaneous fat of the left chin. BONES: No acute abnormality. CTA HEAD: ANTERIOR CIRCULATION: No significant stenosis of the internal carotid arteries. No significant stenosis of the anterior cerebral arteries. No  significant stenosis of the middle cerebral arteries. No aneurysm. POSTERIOR CIRCULATION: No significant stenosis of the posterior cerebral arteries. No significant stenosis of the basilar artery. No significant stenosis of the vertebral arteries. No aneurysm. OTHER: No vascular abnormality seen underlying the acute left cerebral hemorrhage. No spot sign. No dural venous sinus thrombosis on this non-dedicated study. CT PERFUSION: EXAM QUALITY: Exam quality is adequate with diagnostic perfusion maps. No significant motion artifact. Appropriate arterial inflow and venous outflow curves. CORE INFARCT (CBF<30% volume): 10 mL TOTAL HYPOPERFUSION (Tmax>6s volume): 8.  mL PENUMBRA: Mismatch volume: -2 mL Mismatch ratio: 0.8 Location:  matched perfusion deficit at the left basal ganglia/external capsule, corresponding with the acute left cerebral hemorrhage. No other perfusion abnormality. IMPRESSION: 1. Negative CTA for large vessel occlusion or other emergent findings. No hemodynamically significant or correctable stenosis. 2. No vascular abnormality seen underlying the acute left cerebral hemorrhage. 3. 10 ml matched perfusion deficit within the left cerebral hemisphere, corresponding with the acute hemorrhage. No other perfusion abnormality. Electronically signed by: Morene Hoard MD 10/24/2023 02:08 AM EDT RP Workstation: [id number]   CT CERVICAL SPINE WO CONTRAST Result Date: 10/24/2023 EXAM: CT CERVICAL SPINE WITHOUT CONTRAST 10/24/2023 12:05:39 AM TECHNIQUE: CT of the cervical spine was performed without the administration of intravenous contrast. Multiplanar reformatted images are provided for review. Automated exposure control, iterative reconstruction, and/or weight based adjustment of the mA/kV was utilized to reduce the radiation dose to as low as reasonably achievable. COMPARISON: None available. CLINICAL HISTORY: Neuro deficit, acute, stroke suspected. The patient suffered an unwitnessed fall and  was found outside on the ground by family. FINDINGS: CERVICAL SPINE: BONES AND ALIGNMENT: There is mild osteopenia. There is no evidence of fractures or traumatic malalignment. No focal pathologic bone lesions. There is a 2 mm grade 1 C5-C6 retrolisthesis likely due to discogenic degenerative arthrosis. Similar trace C2-C3 retrolisthesis. DEGENERATIVE CHANGES: There is degenerative disc space loss greatest at C2-C3, C6-C7, and C7-T1, mild disc space loss at C5-C6, normal C3-C4 and C4-C5 disc heights. There are small anterior endplate osteophytes at C2-C3, C5-C6, C6-C7, and C7-T1 but no significant  posterior osteophytes. No herniated discs or cord compromise are seen. There is mild facet joint spurring at most levels, but no levels show significant foraminal encroachment. SOFT TISSUES: No prevertebral soft tissue swelling. There is multivocal advanced tooth decay on limited imaging through the posterior oral cavity. Follow up with a dentist is recommended. No thyroid or laryngeal mass is seen. There is asymmetric calcification at the left carotid bifurcation. . Scarring changes at both lung apices but no apical pneumothorax. IMPRESSION: 1. No acute abnormality of the cervical spine. 2. Osteopenia and degenerative change. 3. Multifocal advanced tooth decay; dental follow-up is recommended. 4. Asymmetric calcification at the left carotid bifurcation. Electronically signed by: Francis Quam MD 10/24/2023 12:35 AM EDT RP Workstation: [id number]   CT HEAD WO CONTRAST ( ) Result Date: 10/24/2023 EXAM: CT HEAD WITHOUT CONTRAST 10/24/2023 12:05:39 AM TECHNIQUE: CT of the head was performed without the administration of intravenous contrast. Automated exposure control, iterative reconstruction, and/or weight based adjustment of the mA/kV was utilized to reduce the radiation dose to as low as reasonably achievable. COMPARISON: None available. CLINICAL HISTORY: Neuro deficit, acute, stroke suspected. Patient presented to  the ED from home via [facility] for a fall. Denies LOC. Patient did strike his head. Patient has a small cut to the right shin. Patient is obviously intoxicated. Patient has decreased sensation and motor function on the right side. BP 204/104. Has not taken medications. HR 77. Unknown last known well. Patient was found outside lying on the ground by his sister. FINDINGS: BRAIN AND VENTRICLES: Acute 2.4 x 1.5 x 3.0 cm hemorrhage within left basal ganglia with mild surrounding edema. Patchy white matter hypodensities, compatible with chronic microvascular ischemic disease. Minimal adjacent mass effect. No midline shift. No hydrocephalus. No extra-axial collection. No evidence of acute infarct. ORBITS: No acute abnormality. SINUSES: No acute abnormality. SOFT TISSUES AND SKULL: No acute soft tissue abnormality. No skull fracture. IMPRESSION: 1. Acute left basal ganglia intraparenchymal hemorrhage with mild edema and minimal mass effect. 2. CRITICAL VALUE/EMERGENT RESULTS WERE CALLED BY TELEPHONE AT THE TIME OF INTERPRETATION ON 10 / 18 / 25 at 12:12 am to Dr. Jossie, WHO VERBALLY ACKNOWLEDGED THESE RESULTS. Electronically signed by: Norman Gatlin MD 10/24/2023 12:19 AM EDT RP Workstation: [id number]         Laneta Blunt, DO [facility] 10/27/2023, 6:46 PM    Dictation software may have been used to generate the above note. Typos may occur and escape review in typed/dictated notes. Please contact Dr Blunt directly for clarity if needed.  Staff may message me via secure chat in Epic  but this may not receive an immediate response,  please page me for urgent matters!  If 7PM-7AM, please contact night coverage [email or web address]

## 2023-10-27 NOTE — Consult Note (Signed)
 PHARMACY CONSULT NOTE - ELECTROLYTES  Pharmacy Consult for Electrolyte Monitoring and Replacement   Recent Labs: Height: 5' 5 (165.1 cm) Weight: 63.5 kg (139 lb 15.9 oz) IBW/kg (Calculated) : 61.5 Estimated Creatinine Clearance: 48.2 mL/min (by C-G formula based on SCr of 1.24 mg/dL). Potassium (mmol/L)  Date Value  10/27/2023 3.8   Magnesium (mg/dL)  Date Value  89/78/7974 2.4   Calcium (mg/dL)  Date Value  89/78/7974 8.8 (L)   Albumin (g/dL)  Date Value  89/82/7974 4.4   Phosphorus (mg/dL)  Date Value  89/78/7974 5.5 (H)   Sodium (mmol/L)  Date Value  10/27/2023 135    Assessment  Parker Brown is a 68 y.o. male presenting with right-sided weakness and a fall. PMH significant for COPD, polysubstance abuse (cocaine, marijuana, current everyday smoker), HTN, HLD, and arthritis . Pharmacy has been consulted to monitor and replace electrolytes.  Diet: regular Pertinent medications: losartan 50mg  daily  Goal of Therapy: Electrolytes WNL  Plan:  No electrolyte replacement indicated at this time.  Check BMP, Mg, Phos with AM labs  Thank you for allowing pharmacy to be a part of this patient's care.  Bernardino George, PharmD Candidate [phone] [facility] [employer] [facility] 10/27/2023 7:25 AM

## 2023-10-27 NOTE — Hospital Course (Addendum)
 Hospital course / significant events:   68 y.o male with significant PMHx of COPD, polysubstance abuse (cocaine, marijuana, current everyday smoker), HTN, HLD, and arthritis who presented to the ED with chief complaints of right-sided weakness and a fall.   HPI: acute onset of right sided weakness which caused him to fall while walking to a neighbor's house. He denied hitting his head, LOC or any other associated symptoms. Patient admitted to smoking and using cocaine prior to presenting to the ED. On EMS arrival patient was noted with blood pressure of 204/104. 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 Headache reported in PM, CT head repeated 10/19: feels more weak in the right side, able to verbalize and follow commands. Repeat CT head ordered given NIH increased to 15. 10/20 persistent R hemiplegia. Weaned off cleviprex and ok to transfer to hospitalist. Plan placement for  inpatient rehab  10/21: EEG done pending read. If no concerns there can dc, if epileptiform can d/w neiro re: Rx but should not hold up dc  10/22.  EEG did not show any seizures but did show cortical dysfunction in the left hemisphere.  Patient had some agitation in the afternoon. 10/23.  Patient was able to move his right leg for me.  Patient has shoulder twitch right side. 10/24.  Patient able to straight leg raise with his right leg and flex in his right knee.  Patient able to lift right arm up off the bed but incoordinated with his hand and shoulder. 10/25.  Patient advised to continue doing exercises in the bed with straight leg raises and moving his arm. 10/26.  Patient able to extend with his right fingers and make a fist.  Patient able to lift up his right arm but still a little uncoordinated.  Still no movement right ankle  and foot but able to flex and extend at the knee and hip. 10/27.  Patient still not able to move right ankle and foot but able to straight leg raise and bend at the knee.  Little bit more movement out of his arm today 10/28.  Patient stated he walked with physical therapy today.  Consultants:  PCCU initially admitted to ICU Neurology Palliative care   Procedures/Surgeries: none

## 2023-10-27 NOTE — Progress Notes (Signed)
 Occupational Therapy Treatment Patient Details Name: Parker Brown MRN: [id number] DOB: 29-Mar-1955 Today's Date: 10/27/2023   History of present illness Patient is a 68 year old male with fall, acute left basal ganglia ICH, hypertensive emergency. Repeat head CT stable. PMH: hypertension and polysubstance abuse   OT comments  Upon entering the room, pt supine in bed and sleeping soundly but is agreeable to OT and PT co-treatment. Pt performing bed mobility with min A to EOB. Pt needing min - mod A for static sitting balance. Stand pivot transfer with R knee blocked and mod A of 2 to [facility] Pt does not use commode but just sits and rest. Intermittent inattention to R UE needing cuing to safely attend to UE. Pt returns back to bed in same manner as above. Sit >supine with mod A of 2. Call bell and all needed items within reach. Pt does display some R quad and trace bicep activation this session while seated in commode chair that was not previously seen. Pt making progress towards goals.       If plan is discharge home, recommend the following:  Two people to help with walking and/or transfers;Two people to help with bathing/dressing/bathroom;Assistance with cooking/housework;Assistance with feeding;Direct supervision/assist for medications management;Assist for transportation;Direct supervision/assist for financial management;Help with stairs or ramp for entrance;Supervision due to cognitive status   Equipment Recommendations  Other (comment) (defer to next venue of care)       Precautions / Restrictions Precautions Precautions: Fall Recall of Precautions/Restrictions: Impaired       Mobility Bed Mobility Overal bed mobility: Needs Assistance Bed Mobility: Supine to Sit, Sit to Supine     Supine to sit: Min assist, +2 for physical assistance Sit to supine: Min assist, +2 for physical assistance   General bed mobility comments: facilitation for right hemibody. increased time  required    Transfers Overall transfer level: Needs assistance Equipment used: 2 person hand held assist Transfers: Bed to chair/wheelchair/BSC   Stand pivot transfers: Mod assist, +2 physical assistance         General transfer comment: increased independence and coordination with transfers with faciliation for hip extension. patient able to pivot from bed to bed side commode and back to bed. right knee blocked intermittently but right leg does appear to accept some weight today compared to prior standing attempt     Balance Overall balance assessment: Needs assistance Sitting-balance support: Feet supported Sitting balance-Leahy Scale: Poor Sitting balance - Comments: intermittent extenal support provided with dynamic activity. for static sitting, patient able to maintain midline without external support Postural control: Right lateral lean, Posterior lean Standing balance support: Bilateral upper extremity supported Standing balance-Leahy Scale: Zero Standing balance comment: external support required to maintain standing                           ADL either performed or assessed with clinical judgement   ADL Overall ADL's : Needs assistance/impaired                         Toilet Transfer: Moderate assistance;+2 for physical assistance;BSC/3in1                  Extremity/Trunk Assessment Upper Extremity Assessment Upper Extremity Assessment: Right hand dominant;RUE deficits/detail RUE Deficits / Details: bicep trace activation RUE Sensation: decreased light touch;decreased proprioception RUE Coordination: decreased fine motor;decreased gross motor  Vision Patient Visual Report: No change from baseline           Communication Communication Communication: Impaired Factors Affecting Communication: Difficulty expressing self   Cognition Arousal: Alert Behavior During Therapy: Restless, Flat affect, Impulsive Cognition: No  family/caregiver present to determine baseline, Cognition impaired   Orientation impairments: Place, Situation, Time Awareness: Intellectual awareness impaired, Online awareness impaired   Attention impairment (select first level of impairment): Focused attention Executive functioning impairment (select all impairments): Initiation, Organization, Sequencing, Reasoning, Problem solving                   Following commands: Impaired Following commands impaired: Follows one step commands with increased time      Cueing   Cueing Techniques: Verbal cues, Gestural cues, Tactile cues, Visual cues        General Comments after standing, patient has increased movement in the right arm and right leg. patient is able to complete partial LAQ with RLE (2-/5 knee extension)    Pertinent Vitals/ Pain       Pain Assessment Pain Assessment: No/denies pain Faces Pain Scale: No hurt         Frequency  Min 2X/week        Progress Toward Goals  OT Goals(current goals can now be found in the care plan section)  Progress towards OT goals: Progressing toward goals         Co-evaluation    PT/OT/SLP Co-Evaluation/Treatment: Yes Reason for Co-Treatment: Complexity of the patient's impairments (multi-system involvement);For patient/therapist safety PT goals addressed during session: Mobility/safety with mobility OT goals addressed during session: ADL's and self-care;Proper use of Adaptive equipment and DME      AM-PAC OT 6 Clicks Daily Activity     Outcome Measure   Help from another person eating meals?: A Lot Help from another person taking care of personal grooming?: A Lot Help from another person toileting, which includes using toliet, bedpan, or urinal?: A Lot Help from another person bathing (including washing, rinsing, drying)?: A Lot Help from another person to put on and taking off regular upper body clothing?: A Lot Help from another person to put on and taking off  regular lower body clothing?: Total 6 Click Score: 11    End of Session    OT Visit Diagnosis: Unsteadiness on feet (R26.81);Repeated falls (R29.6);Muscle weakness (generalized) (M62.81)   Activity Tolerance Patient limited by fatigue   Patient Left in bed;with call bell/phone within reach;with bed alarm set   Nurse Communication Mobility status        Time: 1000-1023 OT Time Calculation (min): 23 min  Charges: OT General Charges $OT Visit: 1 Visit OT Treatments $Self Care/Home Management : 8-22 mins  Izetta Claude, MS, OTR/L , CBIS ascom [phone]  10/27/23, 2:13 PM

## 2023-10-27 NOTE — Progress Notes (Signed)
 Palliative Care Progress Note, Assessment & Plan   Patient Name: Parker Brown       Date: 10/27/2023 DOB: [birth year]  Age: 68 y.o. MRN#: [id number] Attending Physician: Parker Edelman, DO Primary Care Physician: Pcp, No Admit Date: 10/23/2023  Subjective: Patient is lying in bed, asleep and lying on his right side, but awakens to my presence.  Once awake, he is alert and oriented x 4.  PT is at bedside during my visit.  No family or friends present at bedside during my visit.  HPI: 68 y.o. male  with past medical history of COPD, HTN, HLD, current tobacco use, polysubstance abuse (cocaine, marijuana), PTSD, osteoarthritis with chronic low back pain, diverticulosis, carpal tunnel syndrome, and chronic hep C admitted on 10/23/2023 with right sided weakness.   As per chart review, patient experienced right sided weakness resulting in fall while walking to her neighbor's house.  He was found outside lying on the ground by his sister who subsequently called EMS.    Upon presentation to the ED, patient was hypertensive.  Additionally, RN noted patient was obviously intoxicated (Etoh negative) and endorsed striking his head with no loss of consciousness during fall.   Stat noncontrast CT obtained which revealed acute left basal ganglia intraparenchymal hemorrhage with mild edema and minimal mass effect.  Follow-up CTA of head and neck revealed no acute intracranial abnormalities or LVO.   Patient is being treated for intracranial hemorrhage with hemorrhagic stroke, hypertensive emergency, and cocaine abuse.   PMT was consulted to support patient and family with goals of care discussions.  Summary of counseling/coordination of care: Extensive chart review completed prior to meeting patient  including labs, vital signs, imaging, progress notes, orders, and available advanced directive documents from current and previous encounters.   After reviewing the patient's chart and assessing the patient at bedside, I spoke with patient in regards to symptom management and goals of care.   Symptoms assessed.  Patient endorses he has no acute complaints at this time.  He denies headache, chest pain, N/V/D, and other acute issues at this time.  No adjustment to [facility] needed.  I attempted to gauge patient's understanding of his current medical situation.  He recalls that he fell and that and he was subsequently brought to the hospital.  Education provided on intraparenchymal hemorrhage and stroke, resulting in weakness of his right side.  He endorses understanding.  Therapeutic silence, active listening, and emotional support provided while discussing patient's recent stroke and new onset of right-sided weakness.  Advanced directives and next of kin decision maker reviewed with patient.  Patient endorses she is married and has 3 children.  He confirms that his wife is Engineer, drilling.  He shares he has been unable to get in contact with her as she is having phone issues.  I offered to reach out to other family members for support.  He shares she has 3 children but he does not know their phone number since he does not have his phone.  Discussed that in the event that patient is unable to speak for himself, patient would want his wife Parker Brown to be his surrogate Management consultant.  During our discussion, patient took several pauses and  shares that he gets trying to get the words out.  PT is at bedside attempted to get patient out of bed.  Patient was in agreement for me to return at a later time to continue goals of care discussions.  I plan to f/u with patient later today.   After visiting with the patient, I attempted to speak with his wife Parker Brown over the phone.  No answer.  Again, HIPAA compliant  voicemail left with PMT contact info.  Physical Exam Vitals reviewed.  Constitutional:      General: He is not in acute distress.    Appearance: He is normal weight. He is not ill-appearing.  HENT:     Head: Normocephalic.     Mouth/Throat:     Mouth: Mucous membranes are moist.  Eyes:     Pupils: Pupils are equal, round, and reactive to light.  Cardiovascular:     Pulses: Normal pulses.  Pulmonary:     Effort: Pulmonary effort is normal.  Abdominal:     Palpations: Abdomen is soft.  Musculoskeletal:     Comments: Left sided weakness  Skin:    General: Skin is warm and dry.  Neurological:     Mental Status: He is alert and oriented to person, place, and time.             35 minute visit includes: Detailed review of medical records (labs, imaging, vital signs), medically appropriate exam (mental status, respiratory, cardiac, skin), discussed with treatment team, counseling and educating patient, family and staff, documenting clinical information, medication management and coordination of care.  Parker L. Arvid, DNP, FNP-BC Palliative Medicine Team

## 2023-10-27 NOTE — Progress Notes (Addendum)
 Inpatient Rehab Admissions Coordinator:   Attempted to call [name] (pt's sister) at the number in the chart [phone]) but number disconnected.  I was able to reach pt on the hospital phone.  He is aware he's in the hospital and has had a stroke but didn't have much to say about rehab.  He confirms he lives with his sister and that she has a new number but he doesn't know it.  He reports she is coming to see him today.  I spoke to his nurse and LCSW and asked if anyone was able to get an updated number I can call to discuss caregiver support.   [phone]: New number in chart for [facility]  I left a message to discuss caregiver support.    Reche Lowers, PT, DPT Admissions Coordinator [phone] 10/27/23  1:52 PM

## 2023-10-28 DIAGNOSIS — R29898 Other symptoms and signs involving the musculoskeletal system: Secondary | ICD-10-CM | POA: Diagnosis not present

## 2023-10-28 DIAGNOSIS — F1410 Cocaine abuse, uncomplicated: Secondary | ICD-10-CM | POA: Diagnosis not present

## 2023-10-28 DIAGNOSIS — G9341 Metabolic encephalopathy: Secondary | ICD-10-CM

## 2023-10-28 DIAGNOSIS — Z515 Encounter for palliative care: Secondary | ICD-10-CM | POA: Diagnosis not present

## 2023-10-28 DIAGNOSIS — G839 Paralytic syndrome, unspecified: Secondary | ICD-10-CM

## 2023-10-28 DIAGNOSIS — F1010 Alcohol abuse, uncomplicated: Secondary | ICD-10-CM

## 2023-10-28 DIAGNOSIS — I619 Nontraumatic intracerebral hemorrhage, unspecified: Secondary | ICD-10-CM | POA: Diagnosis not present

## 2023-10-28 DIAGNOSIS — I6389 Other cerebral infarction: Secondary | ICD-10-CM | POA: Diagnosis not present

## 2023-10-28 DIAGNOSIS — I161 Hypertensive emergency: Secondary | ICD-10-CM | POA: Diagnosis not present

## 2023-10-28 LAB — BASIC METABOLIC PANEL WITH GFR
Anion gap: 9 (ref 5–15)
BUN: 27 mg/dL — ABNORMAL HIGH (ref 8–23)
CO2: 24 mmol/L (ref 22–32)
Calcium: 8.7 mg/dL — ABNORMAL LOW (ref 8.9–10.3)
Chloride: 102 mmol/L (ref 98–111)
Creatinine, Ser: 1.27 mg/dL — ABNORMAL HIGH (ref 0.61–1.24)
GFR, Estimated: >60 mL/min (ref >60)
Glucose, Bld: 114 mg/dL — ABNORMAL HIGH (ref 70–99)
Potassium: 3.9 mmol/L (ref 3.5–5.1)
Sodium: 135 mmol/L (ref 135–145)

## 2023-10-28 LAB — GLUCOSE, CAPILLARY
Glucose-Capillary: 99 mg/dL (ref 70–99)
Glucose-Capillary: 111 mg/dL — ABNORMAL HIGH (ref 70–99)
Glucose-Capillary: 97 mg/dL (ref 70–99)

## 2023-10-28 LAB — PHOSPHORUS: Phosphorus: 4.4 mg/dL (ref 2.5–4.6)

## 2023-10-28 LAB — CBC
HCT: 38.9 % — ABNORMAL LOW (ref 39.0–52.0)
Hemoglobin: 13 g/dL (ref 13.0–17.0)
MCH: 29.5 pg (ref 26.0–34.0)
MCHC: 33.4 g/dL (ref 30.0–36.0)
MCV: 88.2 fL (ref 80.0–100.0)
Platelets: 235 K/uL (ref 150–400)
RBC: 4.41 MIL/uL (ref 4.22–5.81)
RDW: 14.6 % (ref 11.5–15.5)
WBC: 4.8 K/uL (ref 4.0–10.5)
nRBC: 0 % (ref 0.0–0.2)

## 2023-10-28 LAB — MAGNESIUM: Magnesium: 2.3 mg/dL (ref 1.7–2.4)

## 2023-10-28 MED ORDER — ZIPRASIDONE MESYLATE 20 MG IM SOLR
10.0000 mg | Freq: Once | INTRAMUSCULAR | Status: AC
  Administered 2023-10-28: 10 mg via INTRAMUSCULAR
  Filled 2023-10-28: qty 20

## 2023-10-28 MED ORDER — LORAZEPAM 2 MG/ML IJ SOLN: 1.0000 mg | INTRAMUSCULAR | Status: DC | PRN

## 2023-10-28 MED ORDER — HALOPERIDOL LACTATE 5 MG/ML IJ SOLN
1.0000 mg | Freq: Four times a day (QID) | INTRAMUSCULAR | Status: DC | PRN
  Administered 2023-10-28: 1 mg via INTRAVENOUS
  Filled 2023-10-28: qty 1

## 2023-10-28 MED ORDER — MORPHINE SULFATE (PF) 2 MG/ML IV SOLN: 2.0000 mg | INTRAVENOUS | Status: DC | PRN

## 2023-10-28 MED ORDER — QUETIAPINE FUMARATE 25 MG PO TABS
25.0000 mg | ORAL_TABLET | Freq: Every day | ORAL | Status: DC
  Administered 2023-10-29 – 2023-11-01 (×4): 25 mg via ORAL
  Filled 2023-10-28 (×5): qty 1

## 2023-10-28 MED ORDER — LORAZEPAM 1 MG PO TABS: 1.0000 mg | ORAL_TABLET | ORAL | Status: DC | PRN

## 2023-10-28 NOTE — Progress Notes (Signed)
 Inpatient Rehab Admissions Coordinator:   I was able to reach pt's sister, Claud, on the phone.  Confirmed that pt lives with her and their other sister, Verneita, at baseline.  Verneita does not have a phone.  [name] is pt's ex-wife. I reviewed rehab recommendations and goals/expectations of rehab stay with Ophelia to include ELOS 2 weeks, goals of supervision.  Claud was reluctant to agree to rehab without pt's consent (see my note from 10/21 regarding pt and I's convo), and also reluctant to confirm whether Verneita could provide supervision for pt while Claud was at work.  She asked about VA benefits surrounding additional caregiver support.  I've reached out to acute and CIR CSW to see if this may be a possibility and I will try and touch base with the patient again today to see if he's agreeable to rehab at [facility].    Reche Lowers, PT, DPT Admissions Coordinator [phone] 10/28/23  11:31 AM

## 2023-10-28 NOTE — Assessment & Plan Note (Addendum)
 Secondary to cocaine.  Blood pressure much improved.  Patient on Norvasc  and Cozaar

## 2023-10-28 NOTE — Assessment & Plan Note (Addendum)
 No signs of withdrawal during the hospital course.  I think when they gave him Ativan  it did make him worse.  Ativan  was discontinued

## 2023-10-28 NOTE — Procedures (Signed)
 Routine EEG Report  Parker Brown is a 68 y.o. male with a history of altered mental status who is undergoing an EEG to evaluate for seizures.  Report: This EEG was acquired with electrodes placed according to the International 10-20 electrode system (including Fp1, Fp2, F3, F4, C3, C4, P3, P4, O1, O2, T3, T4, T5, T6, A1, A2, Fz, Cz, Pz). The following electrodes were missing or displaced: none.  The occipital dominant rhythm was 8.5 Hz. This activity is reactive to stimulation. Drowsiness was manifested by background fragmentation; deeper stages of sleep were identified by K complexes and sleep spindles. There was no focal slowing. There were no interictal epileptiform discharges. There were no electrographic seizures identified. There was no abnormal response to photic stimulation or hyperventilation.   Impression: This EEG was obtained while awake and asleep and is normal.    Clinical Correlation: Normal EEGs, however, do not rule out epilepsy.  [name], MD [facility] [phone]  If 7pm- 7am, please page neurology on call as listed in AMION.

## 2023-10-28 NOTE — Assessment & Plan Note (Addendum)
 This problem has resolved.  I think Ativan  earlier in the hospitalization made him worse.

## 2023-10-28 NOTE — Progress Notes (Signed)
 While this nurse was off of floor, this nurse received phone call from charge nurse that patient had fallen out of bed.  It was noted that pt fell on to floor mat and was sitting upright.  Pt was already back in bed by the time this nurse made it to the room.  Pt was assessed from head to toe for signs of injury but no new injuries noted.  Dr. Ginnie was notified by staff of fall.  MD notified that pt says his right leg is sore which is the side of the weakness.  Vital signs were stable.  Pt has been very restless today needing lorazepam  per CIWA scale.  Bed alarm was on which alerted staff to enter room.  Pt has been stating he wants to go home all throughout shift and has tried to climb out of bed multiple times.  Currently, pt is lying in bed with no complaints.  Awaiting any orders from MD.  Patient's sister Claud was notified of fall.

## 2023-10-28 NOTE — Progress Notes (Signed)
 Progress Note   Patient: Parker Brown FMW:[id number] DOB: 04/29/1955 DOA: 10/23/2023     4 DOS: the patient was seen and examined on 10/28/2023   Brief hospital course: Hospital course / significant events:   68 y.o male with significant PMHx of COPD, polysubstance abuse (cocaine, marijuana, current everyday smoker), HTN, HLD, and arthritis who presented to the ED with chief complaints of right-sided weakness and a fall.   HPI: acute onset of right sided weakness which caused him to fall while walking to a neighbor's house. He denied hitting his head, LOC or any other associated symptoms. Patient admitted to smoking and using cocaine prior to presenting to the ED. On EMS arrival patient was noted with blood pressure of 204/104. 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 Headache reported in PM, CT head repeated 10/19: feels more weak in the right side, able to verbalize and follow commands. Repeat CT head ordered given NIH increased to 15. 10/20 persistent R hemiplegia. Weaned off cleviprex and ok to transfer to hospitalist. Plan placement for  inpatient rehab  10/21: EEG done pending read. If no concerns there can dc, if epileptiform can d/w neiro re: Rx but should not hold up dc  10/22.  EEG did not show any seizures but did show cortical dysfunction in the left hemisphere.  Consultants:  PCCU initially admitted to ICU Neurology Palliative care   Procedures/Surgeries: none        Assessment and Plan: * Acute hemorrhagic infarction of brain [facility]) Neurology recommends no antiplatelet agents at this time.  Patient has right-sided paralysis.  I did see his shoulder twitch today.  Acute metabolic encephalopathy Will give a dose of Geodon and Seroquel at night.  As needed  Haldol.  Will get a sitter.  Hopefully can move closer to the nursing station.  Hypertensive emergency Secondary to cocaine.  Blood pressure much improved.  Patient on Norvasc  and Cozaar  Cocaine abuse (HCC) Must stop this.  Alcohol abuse Does not seem to be withdrawing but may be worsening with Ativan .  No tremor left arm.        Subjective: Patient seen this morning and was trying to get on the bedpan.  I got the bedpan closer to him in on the bed so we can have him sit on the bedpan.  This afternoon I was called to see him after a fall.  Did not hit his head with a fall.  I advised that he must stay in the bed until someone is around to help him move around.  Patient admitted with stroke and has right-sided paralysis.  Called that patient is becoming more agitated.  Physical Exam: Vitals:   10/28/23 0621 10/28/23 0856 10/28/23 1256 10/28/23 1451  BP: 125/78 122/80 131/85 129/80  Pulse: 86 93 84 (!) 108  Resp:  18 18   Temp:  97.9 F (36.6 C) 98.3 F (36.8 C)   TempSrc:  Oral Oral   SpO2:  100% 100% 100%  Weight:      Height:       Physical Exam HENT:     Head: Normocephalic.  Eyes:     General: Lids are normal.  Cardiovascular:     Rate and Rhythm: Normal rate and regular rhythm.     Heart sounds: Normal heart sounds, S1 normal  and S2 normal.  Pulmonary:     Breath sounds: No decreased breath sounds, wheezing, rhonchi or rales.  Abdominal:     Palpations: Abdomen is soft.     Tenderness: There is no abdominal tenderness.  Musculoskeletal:     Right lower leg: No swelling.     Left lower leg: No swelling.  Skin:    General: Skin is warm.     Findings: No rash.  Neurological:     Mental Status: He is alert.     Comments: Right-sided paralysis.  Was able to shoulder shrug right shoulder.  No tremor left hand.     Data Reviewed: Creatinine 1.28, white blood cell count 6.3, hemoglobin 12.4, platelet count 210  Family Communication: Tried to call  sister  Disposition: Status is: Inpatient Remains inpatient appropriate because: Looking into CIR  Planned Discharge Destination: Looking into CIR    Time spent: 28 minutes  Author: Charlie Patterson, MD 10/28/2023 4:01 PM  For on call review [email or web address].

## 2023-10-28 NOTE — Progress Notes (Signed)
 Palliative Care Progress Note, Assessment & Plan   Patient Name: Parker Brown       Date: 10/28/2023 DOB: [birth year]  Age: 68 y.o. MRN#: [id number] Attending Physician: Josette Ade, MD Primary Care Physician: Pcp, No Admit Date: 10/23/2023  Subjective: Patient is lying in bed on his right side, sleeping, but easily awakens to my presence.  He acknowledges my presence and is able to make his wishes known.  No family or friends present at bedside during my visit.  HPI: 68 y.o. male  with past medical history of COPD, HTN, HLD, current tobacco use, polysubstance abuse (cocaine, marijuana), PTSD, osteoarthritis with chronic low back pain, diverticulosis, carpal tunnel syndrome, and chronic hep C admitted on 10/23/2023 with right sided weakness.   As per chart review, patient experienced right sided weakness resulting in fall while walking to her neighbor's house.  He was found outside lying on the ground by his sister who subsequently called EMS.    Upon presentation to the ED, patient was hypertensive.  Additionally, RN noted patient was obviously intoxicated (Etoh negative) and endorsed striking his head with no loss of consciousness during fall.   Stat noncontrast CT obtained which revealed acute left basal ganglia intraparenchymal hemorrhage with mild edema and minimal mass effect.  Follow-up CTA of head and neck revealed no acute intracranial abnormalities or LVO.   Patient is being treated for intracranial hemorrhage with hemorrhagic stroke, hypertensive emergency, and cocaine abuse.   PMT was consulted to support patient and family with goals of care discussions.  Summary of counseling/coordination of care: Extensive chart review completed prior to meeting patient including labs,  vital signs, imaging, progress notes, orders, and available advanced directive documents from current and previous encounters.   After reviewing the patient's chart and assessing the patient at bedside, I spoke with patient in regards to symptom management and goals of care.   Symptoms assessed.  Patient has no acute physical complaints at this time.  He speaks of displeasure and frustration when dealing with members of the medical team.  He believes someone laughed at him and believes he is incompetent.  Space and opportunity provided for patient to share his thoughts and emotions regarding his current medical situation.  He speaks of PTSD, time spent abroad with the [employer], needing to get medical records from the [place], [place] , and other facilities so that people here can know what the hell is going on.  Despite multiple efforts to redirect patient to current hospitalization and plan of care, he continued to provide historical evidence of his time spent in the [place].  Patient hyperfocused on wanting increased amounts of trazodone .  He endorses using 100-125 mg of trazodone  but that they are only giving him 25 mg here.  He shares he is already voices concern to attending.  No adjustment to [facility] needed at this time.  I attempted to discuss patient's next of kin and any boundaries or limitations set on his medical treatments.  He says he wants to live.  He continues to worry that his sister and his wife have not been in touch.  He is aware of the wellness check.  Awaiting response if any from wellness  check to continue goals of care discussions with patient's next of kin.  Discussed next steps for patient.  He endorses wanting to work with therapy so that he can get stronger.  He shares he understands his muscles will seize up and get too tight to work if he does not utilize them now.  Education provided on importance of PT and OT for continued recovery.  However, cautioned patient that he  needed continues to be at risk for stroke especially within the first year post stroke and that not only PT/OT will be continued with strict adherence to medication regimen.  He endorses understanding and shares he needs ready to do the work.  Full code and full scope remain.  PMT will continue to follow and support.  Physical Exam Vitals reviewed.  Neurological:     Mental Status: He is alert.            50 minute visit includes: Detailed review of medical records (labs, imaging, vital signs), medically appropriate exam (mental status, respiratory, cardiac, skin), discussed with treatment team, counseling and educating patient, family and staff, documenting clinical information, medication management and coordination of care.  Lamarr L. Arvid, DNP, FNP-BC Palliative Medicine Team

## 2023-10-28 NOTE — Assessment & Plan Note (Addendum)
 Neurology recommends no antiplatelet agents at this time.  Patient has right-sided weakness.  Patient's right side has improved during the hospital course.  Patient still with no movement of ankle and right foot.  Patient able to straight leg raise and flex at the knee.  Able to extend fingers very slowly and grip very slowly.  Able to bend at the elbow flex and extend.  Incoordination with the right shoulder.

## 2023-10-28 NOTE — Assessment & Plan Note (Signed)
 Must stop this.

## 2023-10-28 NOTE — TOC Progression Note (Signed)
 Transition of Care [facility]) - Progression Note    Patient Details  Name: Dillinger Aston MRN: [id number] Date of Birth: Apr 22, 1955  Transition of Care [facility]) CM/SW Contact  Dalia GORMAN Fuse, RN Phone Number: 10/28/2023, 10:31 AM  Clinical Narrative:     CIR evaluating patient to see if he is a candidate for CIR.   TOC will continue to follow for discharge planning.    Barriers to Discharge: Continued Medical Work up               Expected Discharge Plan and Services       Living arrangements for the past 2 months: Single Family Home                                       Social Drivers of Health (SDOH) Interventions SDOH Screenings   Food Insecurity: No Food Insecurity (10/24/2023)  Housing: Low Risk  (10/24/2023)  Transportation Needs: No Transportation Needs (10/24/2023)  Utilities: Not At Risk (10/24/2023)  Social Connections: Socially Isolated (10/24/2023)  Tobacco Use: High Risk (10/23/2023)    Readmission Risk Interventions    10/27/2023   11:45 AM  Readmission Risk Prevention Plan  Transportation Screening Complete  PCP or Specialist Appt within 5-7 Days Complete  Medication Review (RN CM) Complete

## 2023-10-29 DIAGNOSIS — G9341 Metabolic encephalopathy: Secondary | ICD-10-CM | POA: Diagnosis not present

## 2023-10-29 DIAGNOSIS — I161 Hypertensive emergency: Secondary | ICD-10-CM | POA: Diagnosis not present

## 2023-10-29 DIAGNOSIS — Z515 Encounter for palliative care: Secondary | ICD-10-CM | POA: Diagnosis not present

## 2023-10-29 DIAGNOSIS — R29898 Other symptoms and signs involving the musculoskeletal system: Secondary | ICD-10-CM

## 2023-10-29 DIAGNOSIS — I6389 Other cerebral infarction: Secondary | ICD-10-CM | POA: Diagnosis not present

## 2023-10-29 DIAGNOSIS — I619 Nontraumatic intracerebral hemorrhage, unspecified: Secondary | ICD-10-CM | POA: Diagnosis not present

## 2023-10-29 LAB — GLUCOSE, CAPILLARY
Glucose-Capillary: 90 mg/dL (ref 70–99)
Glucose-Capillary: 111 mg/dL — ABNORMAL HIGH (ref 70–99)
Glucose-Capillary: 114 mg/dL — ABNORMAL HIGH (ref 70–99)

## 2023-10-29 NOTE — Progress Notes (Signed)
 Progress Note   Patient: Parker Brown FMW:[id number] DOB: [birth year] DOA: 10/23/2023     5 DOS: the patient was seen and examined on 10/29/2023   Brief hospital course: Hospital course / significant events:   68 y.o male with significant PMHx of COPD, polysubstance abuse (cocaine, marijuana, current everyday smoker), HTN, HLD, and arthritis who presented to the ED with chief complaints of right-sided weakness and a fall.   HPI: acute onset of right sided weakness which caused him to fall while walking to a neighbor's house. He denied hitting his head, LOC or any other associated symptoms. Patient admitted to smoking and using cocaine prior to presenting to the ED. On EMS arrival patient was noted with blood pressure of 204/104. 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 Headache reported in PM, CT head repeated 10/19: feels more weak in the right side, able to verbalize and follow commands. Repeat CT head ordered given NIH increased to 15. 10/20 persistent R hemiplegia. Weaned off cleviprex and ok to transfer to hospitalist. Plan placement for  inpatient rehab  10/21: EEG done pending read. If no concerns there can dc, if epileptiform can d/w neiro re: Rx but should not hold up dc  10/22.  EEG did not show any seizures but did show cortical dysfunction in the left hemisphere.  Patient had some agitation in the afternoon. 10/23.  Patient was able to move his right leg for me.  Patient has shoulder twitch right side.  Consultants:  PCCU initially admitted to ICU Neurology Palliative care   Procedures/Surgeries: none        Assessment and Plan: * Acute hemorrhagic infarction of brain [facility]) Neurology recommends no antiplatelet agents at this time.  Patient has right-sided  weakness.  I did see his shoulder twitch today.  Patient actually lifted up his right leg for me today.  Acute metabolic encephalopathy Patient was a little agitated yesterday.  As needed Haldol for agitation.  Seroquel at night.  No agitation seen when I saw him today.  Hypertensive emergency Secondary to cocaine.  Blood pressure much improved.  Patient on Norvasc  and Cozaar  Cocaine abuse (HCC) Must stop this.  Alcohol abuse Does not seem to be withdrawing but may be worsening with Ativan .  No tremor left arm.  Ativan  discontinued        Subjective: Patient stated he did not sleep as well last night.  I will agitated yesterday.  No agitation seen today when I saw him.  Admitted with acute hemorrhagic infarction of the brain  Physical Exam: Vitals:   10/29/23 0433 10/29/23 0500 10/29/23 0806 10/29/23 0950  BP: (!) 124/95  (!) 130/94 123/82  Pulse: 83  85 78  Resp: 18  17 18   Temp: (!) 97.5 F (36.4 C)   (!) 97.5 F (36.4 C)  TempSrc:    Oral  SpO2: 100%  99%   Weight:  64 kg    Height:       Physical Exam HENT:     Head: Normocephalic.  Eyes:     General: Lids are normal.  Cardiovascular:     Rate and Rhythm: Normal rate and regular rhythm.     Heart sounds: Normal heart sounds, S1 normal and S2 normal.  Pulmonary:     Breath sounds: No decreased breath sounds,  wheezing, rhonchi or rales.  Abdominal:     Palpations: Abdomen is soft.     Tenderness: There is no abdominal tenderness.  Musculoskeletal:     Right lower leg: No swelling.     Left lower leg: No swelling.  Skin:    General: Skin is warm.     Findings: No rash.  Neurological:     Mental Status: He is alert.     Comments: Was able to shoulder shrug right shoulder.  Patient was able to move his right leg better today.  He was able to lift it up off the bed.     Data Reviewed: Last creatinine 1.27 last hemoglobin 13  Family Communication: Spoke with sister on the phone  Disposition: Status is:  Inpatient Continue acute inpatient rehab  Planned Discharge Destination: Acute inpatient rehab    Time spent: 28 minutes  Author: Charlie Patterson, MD 10/29/2023 1:57 PM  For on call review [email or web address].

## 2023-10-29 NOTE — Plan of Care (Signed)
  Problem: Intracerebral Hemorrhage Tissue Perfusion: Goal: Complications of Intracerebral Hemorrhage will be minimized Outcome: Progressing   Problem: Coping: Goal: Will verbalize positive feelings about self Outcome: Progressing   Problem: Health Behavior/Discharge Planning: Goal: Ability to manage health-related needs will improve Outcome: Progressing   Problem: Self-Care: Goal: Ability to participate in self-care as condition permits will improve Outcome: Progressing Goal: Ability to communicate needs accurately will improve Outcome: Progressing   Problem: Nutrition: Goal: Risk of aspiration will decrease Outcome: Progressing Goal: Dietary intake will improve Outcome: Progressing   Problem: Clinical Measurements: Goal: Will remain free from infection Outcome: Progressing

## 2023-10-29 NOTE — Progress Notes (Signed)
 Physical Therapy Treatment Patient Details Name: Parker Brown MRN: [id number] DOB: December 03, 1955 Today's Date: 10/29/2023   History of Present Illness Patient is a 68 year old male with fall, acute left basal ganglia ICH, hypertensive emergency. Repeat head CT stable. PMH: hypertension and polysubstance abuse    PT Comments  Patient sleeping on arrival to room. He is initially agitated and speaking loudly. Cues required for redirection and attention to task. He continues to have right side weakness with trace movement noted in right arm and leg after sitting upright. Improved sitting balance. He continues to require assistance with bed mobility. He declined to stand today despite encouragement, no reason given. Sitter at the bedside. PT will continue to follow to maximize independence. Discharge recommendations updated to rehabilitation < 3 hours/day if CIR is not an option.    If plan is discharge home, recommend the following: Two people to help with walking and/or transfers;Two people to help with bathing/dressing/bathroom;Assistance with cooking/housework;Direct supervision/assist for medications management;Direct supervision/assist for financial management;Assist for transportation;Help with stairs or ramp for entrance;Supervision due to cognitive status   Can travel by private vehicle     No  Equipment Recommendations   (TBD)    Recommendations for Other Services       Precautions / Restrictions Precautions Precautions: Fall Recall of Precautions/Restrictions: Impaired Restrictions Weight Bearing Restrictions Per Provider Order: No     Mobility  Bed Mobility Overal bed mobility: Needs Assistance Bed Mobility: Supine to Sit     Supine to sit: Mod assist     General bed mobility comments: assistance for RLE and trunk support. cues for initiation and technique. patient is internally distracted, perseverating not being able to get his words out correctly. cues for  attention to task    Transfers                   General transfer comment: patient refused despite offering multiple times, using distraction techniques and encouragement. sitter in the room also providing encouragement    Ambulation/Gait                   Stairs             Wheelchair Mobility     Tilt Bed    Modified Rankin (Stroke Patients Only)       Balance Overall balance assessment: Needs assistance Sitting-balance support: Feet unsupported, Single extremity supported Sitting balance-Leahy Scale: Fair Sitting balance - Comments: he has a preference to lean on the the left elbow with fatigue                                    Communication Communication Communication: Impaired Factors Affecting Communication: Difficulty expressing self  Cognition Arousal: Alert Behavior During Therapy: Agitated   PT - Cognitive impairments: Difficult to assess                       PT - Cognition Comments: difficult to assess given initial agitation level and unwillingness to progress mobility Following commands: Impaired Following commands impaired: Follows one step commands with increased time    Cueing Cueing Techniques: Verbal cues, Gestural cues, Tactile cues, Visual cues  Exercises General Exercises - Lower Extremity Long Arc Quad: AAROM, PROM, Strengthening, Right, 5 reps, Seated    General Comments General comments (skin integrity, edema, etc.): encouraged active movement of right side  Pertinent Vitals/Pain Pain Assessment Pain Assessment: No/denies pain    Home Living                          Prior Function            PT Goals (current goals can now be found in the care plan section) Acute Rehab PT Goals Patient Stated Goal: none stated Time For Goal Achievement: 11/08/23 Potential to Achieve Goals: Fair Progress towards PT goals: Progressing toward goals    Frequency    Min  3X/week      PT Plan      Co-evaluation              AM-PAC PT 6 Clicks Mobility   Outcome Measure  Help needed turning from your back to your side while in a flat bed without using bedrails?: A Little Help needed moving from lying on your back to sitting on the side of a flat bed without using bedrails?: A Lot Help needed moving to and from a bed to a chair (including a wheelchair)?: A Lot Help needed standing up from a chair using your arms (e.g., wheelchair or bedside chair)?: A Lot Help needed to walk in hospital room?: Total Help needed climbing 3-5 steps with a railing? : Total 6 Click Score: 11    End of Session   Activity Tolerance: Patient limited by fatigue Patient left: in bed;with call bell/phone within reach;with nursing/sitter in room (sitter at bedside)   PT Visit Diagnosis: Hemiplegia and hemiparesis;Other abnormalities of gait and mobility (R26.89)     Time: 8869-8841 PT Time Calculation (min) (ACUTE ONLY): 28 min  Charges:    $Therapeutic Activity: 23-37 mins PT General Charges $$ ACUTE PT VISIT: 1 Visit                     Randine Essex, PT, MPT    Randine LULLA Essex 10/29/2023, 12:47 PM

## 2023-10-29 NOTE — Progress Notes (Signed)
 Inpatient Rehab Admissions Coordinator:   Per CIR social work team, if caregivers through the [place] are not already in place, it's unlikely that they would be able to set up to start after a short rehab stay.  Claud is unable to confirm whether [facility] could provide 24/7 supervision, and Claud is not able to provide 24/7 due to working.  Since pt will require 24/7 supervision due to his stroke, and family unable to provide he will need to be referred to alternate rehab venues.  I will sign off.   Reche Lowers, PT, DPT Admissions Coordinator [phone] 10/29/23  11:32 AM

## 2023-10-29 NOTE — Progress Notes (Signed)
 Palliative Care Progress Note, Assessment & Plan   Patient Name: Parker Brown       Date: 10/29/2023 DOB: 03-31-1955  Age: 68 y.o. MRN#: [id number] Attending Physician: Josette Ade, MD Primary Care Physician: Pcp, No Admit Date: 10/23/2023  Subjective: Patient is lying in bed on his right side, sleeping.  Safety sitter is at bedside.  Patient easily awakens to my presence.  No family or friends present at bedside during my visit.  HPI: 68 y.o. male  with past medical history of COPD, HTN, HLD, current tobacco use, polysubstance abuse (cocaine, marijuana), PTSD, osteoarthritis with chronic low back pain, diverticulosis, carpal tunnel syndrome, and chronic hep C admitted on 10/23/2023 with right sided weakness.   As per chart review, patient experienced right sided weakness resulting in fall while walking to her neighbor's house.  He was found outside lying on the ground by his sister who subsequently called EMS.    Upon presentation to the ED, patient was hypertensive.  Additionally, RN noted patient was obviously intoxicated (Etoh negative) and endorsed striking his head with no loss of consciousness during fall.   Stat noncontrast CT obtained which revealed acute left basal ganglia intraparenchymal hemorrhage with mild edema and minimal mass effect.  Follow-up CTA of head and neck revealed no acute intracranial abnormalities or LVO.   Patient is being treated for intracranial hemorrhage with hemorrhagic stroke, hypertensive emergency, and cocaine abuse.   PMT was consulted to support patient and family with goals of care discussions.  Summary of counseling/coordination of care: Extensive chart review completed prior to meeting patient including labs, vital signs, imaging, progress  notes, orders, and available advanced directive documents from current and previous encounters.   After reviewing the patient's chart and assessing the patient at bedside, I spoke with patient in regards to symptom management and goals of care.   Symptoms assessed.  Patient denies pain but continues to endorse weakness of his right arm and right leg.  He denies headache, chest pain, and other acute issues at this time.  No adjustment to [employer] needed.  Discussed Overall plan of care with patient.  Reviewed that plan remains to continue with PT/OT and transfer to a facility continue with functional and mobility recovery.  He endorses understanding and says he is ready to go home.  Advised that he will not be going home directly from hospital if recommendation is to transfer to a facility for more intense inpatient rehab.  He endorses understanding.  Again reviewed boundaries of medical treatment.  Patient is accepting of all offered, available, and appropriate medical interventions to sustain his life.  He declines to participate in long-term ventilatory support discussion such as trach/PEG.  He was minimally interactive today but was appreciative of my visit.  Symptom burden is low.  Goals are clear.  Patient's next of kin decision maker is his Sister Parker Brown.   After meeting with the patient, I spoke with patient's sister Parker Brown over the phone.  Medical update given.  Discussed patient has remained in agreement with full code and full scope of care, but has significant bouts of agitation and frustration.  Parker Brown shares she cannot comment on patient's boundaries of care.  She  shares she defers all that decision to patient and family.  She states that she has been encouraging him to get clean and sober for several years now and is hopeful that this instance is a wake-up call.  Discussed patient remains at risk for repeated strokes should he continue to use.  However, patient seems motivated to work with  PT/OT to gain strength.  She is grateful that he is participatory.  However, she is concerned about what will happen once he is transferred out of the hospital.  Discussed patient is not a candidate for [facility] inpatient rehab.  However, plan is to continue to work with PT/OT and potentially transfer to SNF for rehab if PT/OT's recommendations align with this plan of care.  She endorsed understanding.  Space Opportunity provided for her to ask questions and share her thoughts regarding her brother's current medical situation.  She is hopeful that he stops using cocaine and can hopefully be around a lot longer to spend time with family.  Parker Brown shares that patient is stubborn and set in his ways.  She shares she is a long history of substance abuse in which she is continually encouraged him to stop using.  She shares patient has an ex-wife and 2 daughters, as well as 1 daughter who passed away unexpectedly last year.  Reviewed that patient has endorsed that he would like for his sister to be his next of kin decision maker.  However, Parker Brown shares she defers those decisions to patient's daughters.  She has no contact information for patient's daughters and advised me to speak with patient's ex-wife Parker Brown to obtain this information.  After speaking with Parker Brown, I attempted to speak with Parker Brown over the phone.  No answer.  HIPAA compliant voicemail left with PMT contact info.  Awaiting return phone call to establish daughters names and contact info.  PMT will continue to follow and support.   Physical Exam Vitals reviewed.  Constitutional:      General: He is not in acute distress.    Appearance: He is normal weight.  HENT:     Head: Normocephalic.     Mouth/Throat:     Mouth: Mucous membranes are moist.  Eyes:     Pupils: Pupils are equal, round, and reactive to light.  Pulmonary:     Effort: Pulmonary effort is normal.  Abdominal:     Palpations: Abdomen is soft.  Musculoskeletal:      Comments: Right sided weakness  Skin:    General: Skin is warm and dry.  Neurological:     Mental Status: He is alert and oriented to person, place, and time.  Psychiatric:        Mood and Affect: Mood normal.        Behavior: Behavior normal.        Thought Content: Thought content normal.        Judgment: Judgment normal.             50 minute visit includes: Detailed review of medical records (labs, imaging, vital signs), medically appropriate exam (mental status, respiratory, cardiac, skin), discussed with treatment team, counseling and educating patient, family and staff, documenting clinical information, medication management and coordination of care.  Lamarr L. Arvid, DNP, FNP-BC Palliative Medicine Team

## 2023-10-29 NOTE — Progress Notes (Signed)
 Occupational Therapy Treatment Patient Details Name: Parker Brown MRN: [id number] DOB: Oct 31, 1955 Today's Date: 10/29/2023   History of present illness Patient is a 68 year old male with fall, acute left basal ganglia ICH, hypertensive emergency. Repeat head CT stable. PMH: hypertension and polysubstance abuse   OT comments  OT arrives to room for co-treatment with OT and PT with focus on neuromuscular re-education, functional mobility, and self care tasks. Pt is sleeping soundly in dark room with sitter present. Pt awakens easily this session and appears agreeable initially. He states,  I need to use the bathroom. Although he is offered to use BSC or sit on EOB he continues to lay in bed and assistance given to utilize urinal from current position. OT empties urinal. Pt then demands water and HOB elevated for safety with drinking. Pt be comes very agitated and demanding with responses during session and is difficult to redirect. He perseverates on redirection he does get from therapist about his behavior. Pt needing mod A for supine >sit and pt refusing to do any other activity. At this point, this therapist exits the room in hopes of defusing the situation so pt may continue to work with PT. Per chart review, pt's family unable to provide assistance and supervision he will need at discharge and recommendation changed to reflect his need.       If plan is discharge home, recommend the following:  Two people to help with walking and/or transfers;Two people to help with bathing/dressing/bathroom;Assistance with cooking/housework;Assistance with feeding;Direct supervision/assist for medications management;Assist for transportation;Direct supervision/assist for financial management;Help with stairs or ramp for entrance;Supervision due to cognitive status   Equipment Recommendations  Other (comment) (defer to next venue of care)       Precautions / Restrictions Precautions Precautions:  Fall Recall of Precautions/Restrictions: Impaired       Mobility Bed Mobility Overal bed mobility: Needs Assistance Bed Mobility: Supine to Sit     Supine to sit: Mod assist          Transfers                             ADL either performed or assessed with clinical judgement    Communication Communication Communication: Impaired Factors Affecting Communication: Difficulty expressing self   Cognition Arousal: Alert Behavior During Therapy: Agitated Cognition: Cognition impaired   Orientation impairments: Situation, Time Awareness: Intellectual awareness impaired, Online awareness impaired   Attention impairment (select first level of impairment): Focused attention Executive functioning impairment (select all impairments): Initiation, Organization, Sequencing, Reasoning, Problem solving                   Following commands: Impaired Following commands impaired: Follows one step commands with increased time      Cueing   Cueing Techniques: Verbal cues, Gestural cues, Tactile cues, Visual cues        General Comments encouraged active movement of right side    Pertinent Vitals/ Pain       Pain Assessment Pain Assessment: No/denies pain   Frequency  Min 2X/week        Progress Toward Goals  OT Goals(current goals can now be found in the care plan section)  Progress towards OT goals: Progressing toward goals      AM-PAC OT 6 Clicks Daily Activity     Outcome Measure   Help from another person eating meals?: A Lot Help from another person taking care of personal  grooming?: A Lot Help from another person toileting, which includes using toliet, bedpan, or urinal?: A Lot Help from another person bathing (including washing, rinsing, drying)?: A Lot Help from another person to put on and taking off regular upper body clothing?: A Lot Help from another person to put on and taking off regular lower body clothing?: Total 6 Click  Score: 11    End of Session    OT Visit Diagnosis: Unsteadiness on feet (R26.81);Repeated falls (R29.6);Muscle weakness (generalized) (M62.81)   Activity Tolerance Treatment limited secondary to agitation   Patient Left in bed;with call bell/phone within reach;with bed alarm set;with nursing/sitter in room   Nurse Communication Mobility status        Time: 8872-8854 OT Time Calculation (min): 18 min  Charges: OT General Charges $OT Visit: 1 Visit  Izetta Claude, MS, OTR/L , CBIS ascom [phone]  10/29/23, 1:00 PM

## 2023-10-29 NOTE — Progress Notes (Signed)
 SLP Cancellation Note  Patient Details Name: Parker Brown MRN: [id number] DOB: March 14, 1955   Cancelled treatment:       Reason Eval/Treat Not Completed: Other (comment) Continued cognitive communication intervention planned. However, OT/PT reporting increased agitation following their session. SLP Session held in effort to allow pt rest and time to calm. Per chart review, agitation also reported yesterday. SLP will continue with intervention efforts as pt is able/appropriate.   [place] Jacquiline Zurcher Clapp, MS, CCC-SLP Speech Language Pathologist Rehab Services; [facility] [phone] (ascom)    [place] J Clapp 10/29/2023, 1:39 PM

## 2023-10-30 DIAGNOSIS — I161 Hypertensive emergency: Secondary | ICD-10-CM | POA: Diagnosis not present

## 2023-10-30 DIAGNOSIS — I6389 Other cerebral infarction: Secondary | ICD-10-CM | POA: Diagnosis not present

## 2023-10-30 DIAGNOSIS — G9341 Metabolic encephalopathy: Secondary | ICD-10-CM | POA: Diagnosis not present

## 2023-10-30 DIAGNOSIS — F1410 Cocaine abuse, uncomplicated: Secondary | ICD-10-CM | POA: Diagnosis not present

## 2023-10-30 LAB — GLUCOSE, CAPILLARY
Glucose-Capillary: 105 mg/dL — ABNORMAL HIGH (ref 70–99)
Glucose-Capillary: 125 mg/dL — ABNORMAL HIGH (ref 70–99)
Glucose-Capillary: 125 mg/dL — ABNORMAL HIGH (ref 70–99)
Glucose-Capillary: 119 mg/dL — ABNORMAL HIGH (ref 70–99)
Glucose-Capillary: 135 mg/dL — ABNORMAL HIGH (ref 70–99)

## 2023-10-30 MED ORDER — AMLODIPINE BESYLATE 5 MG PO TABS
5.0000 mg | ORAL_TABLET | Freq: Every day | ORAL | Status: DC
Start: 2023-10-30 — End: 2023-10-31
  Administered 2023-10-30 – 2023-10-31 (×2): 5 mg via ORAL
  Filled 2023-10-30 (×2): qty 1

## 2023-10-30 NOTE — TOC Progression Note (Signed)
 Transition of Care [facility]) - Progression Note    Patient Details  Name: Parker Brown MRN: [id number] Date of Birth: 10-Nov-1955  Transition of Care [facility]) CM/SW Contact  [place] L Jyasia Markoff, [place] Phone Number: 10/30/2023, 3:55 PM  Clinical Narrative:     CSW spoke with [place], [place]. [place] advised that patient is only 20% service connected. [facility] will not [facility] for SNF. CSW will review recommendations with patient and his family.     Barriers to Discharge: Continued Medical Work up               Expected Discharge Plan and Services       Living arrangements for the past 2 months: Single Family Home                                       Social Drivers of Health (SDOH) Interventions SDOH Screenings   Food Insecurity: No Food Insecurity (10/24/2023)  Housing: Low Risk  (10/24/2023)  Transportation Needs: No Transportation Needs (10/24/2023)  Utilities: Not At Risk (10/24/2023)  Social Connections: Socially Isolated (10/24/2023)  Tobacco Use: High Risk (10/23/2023)    Readmission Risk Interventions    10/27/2023   11:45 AM  Readmission Risk Prevention Plan  Transportation Screening Complete  PCP or Specialist Appt within 5-7 Days Complete  Medication Review (RN CM) Complete

## 2023-10-30 NOTE — Progress Notes (Signed)
 Occupational Therapy Treatment Patient Details Name: Parker Brown MRN: [id number] DOB: Jun 26, 1955 Today's Date: 10/30/2023   History of present illness Patient is a 68 year old male with fall, acute left basal ganglia ICH, hypertensive emergency. Repeat head CT stable. PMH: hypertension and polysubstance abuse   OT comments  Patient seen for OT treatment on this date. Upon arrival to room patient semi fowlers in bed finishing lunch, agreeable to treatment. Patient presents with RUE hemiplegia s/p CVA; tx focused on postural control and midline orientation during seated activity. Patient tolerated weight bearing through R forearm to promote joint approximation and proprioceptive input. Facilitated reaching and crossing midline with LUE while maintaining weight shift toward affected side. R shoulder subluxation noted- educated patient and sitter on positioning and support of RUE, both agreeable. Patient with noted improvements in RUE mobility/control with R bicep 2/5, able to engage in wrist extension with forearm in neutral and able to elevate scapula. Patient performed SPT from EOB to recliner going to the R with max A for lifting and to block R knee. Patient ended treatment in recliner with chair alarm on and all needs within reach. Patient making good progress toward goals, will continue to follow POC. Discharge recommendation remains appropriate.        If plan is discharge home, recommend the following:  Two people to help with walking and/or transfers;Two people to help with bathing/dressing/bathroom;Assistance with cooking/housework;Assistance with feeding;Direct supervision/assist for medications management;Assist for transportation;Direct supervision/assist for financial management;Help with stairs or ramp for entrance;Supervision due to cognitive status   Equipment Recommendations  Other (comment) (defer to next venue of care)    Recommendations for Other Services       Precautions / Restrictions Precautions Precautions: Fall Recall of Precautions/Restrictions: Impaired Precaution/Restrictions Comments: SBP< 150 Restrictions Weight Bearing Restrictions Per Provider Order: No       Mobility Bed Mobility Overal bed mobility: Needs Assistance Bed Mobility: Supine to Sit Rolling: Min assist   Supine to sit: Min assist          Transfers Overall transfer level: Needs assistance Equipment used: 1 person hand held assist Transfers: Bed to chair/wheelchair/BSC Sit to Stand: Max assist           General transfer comment: SPT from EOB to recliner going to R side, max A for lifting, blocking R knee and lowering A     Balance Overall balance assessment: Needs assistance Sitting-balance support: Feet supported, No upper extremity supported Sitting balance-Leahy Scale: Poor Sitting balance - Comments: leans to L due to fatigue   Standing balance support: Bilateral upper extremity supported, During functional activity, Reliant on assistive device for balance Standing balance-Leahy Scale: Poor Standing balance comment: constant steadying A                           ADL either performed or assessed with clinical judgement   ADL                                              Extremity/Trunk Assessment Upper Extremity Assessment Upper Extremity Assessment: RUE deficits/detail RUE Deficits / Details: triceps trace activation, bicep 2+, forearm and digit 1/5            Vision       Perception     Praxis     Communication Communication  Communication: Impaired Factors Affecting Communication: Difficulty expressing self   Cognition Arousal: Alert Behavior During Therapy: WFL for tasks assessed/performed Cognition: Cognition impaired     Awareness: Intellectual awareness intact, Online awareness intact   Attention impairment (select first level of impairment): Focused attention                      Following commands: Impaired Following commands impaired: Follows one step commands inconsistently, Follows one step commands with increased time      Cueing   Cueing Techniques: Verbal cues, Tactile cues  Exercises      Shoulder Instructions       General Comments Pt perform AAROM RUE/RLE in bed/EOB prior to getting OOB    Pertinent Vitals/ Pain       Pain Assessment Pain Score: 0-No pain Faces Pain Scale: No hurt  Home Living                                          Prior Functioning/Environment              Frequency  Min 2X/week        Progress Toward Goals  OT Goals(current goals can now be found in the care plan section)     Acute Rehab OT Goals Patient Stated Goal: get out of this bed OT Goal Formulation: With patient Time For Goal Achievement: 11/09/23 Potential to Achieve Goals: Fair  Plan      Co-evaluation        PT goals addressed during session: Mobility/safety with mobility;Balance;Proper use of DME;Strengthening/ROM        AM-PAC OT 6 Clicks Daily Activity     Outcome Measure   Help from another person eating meals?: A Lot Help from another person taking care of personal grooming?: A Lot Help from another person toileting, which includes using toliet, bedpan, or urinal?: A Lot Help from another person bathing (including washing, rinsing, drying)?: A Lot Help from another person to put on and taking off regular upper body clothing?: A Lot Help from another person to put on and taking off regular lower body clothing?: Total 6 Click Score: 11    End of Session Equipment Utilized During Treatment: Gait belt  OT Visit Diagnosis: Unsteadiness on feet (R26.81);Repeated falls (R29.6);Muscle weakness (generalized) (M62.81)   Activity Tolerance Patient tolerated treatment well   Patient Left in chair;with call bell/phone within reach;with chair alarm set;with nursing/sitter in room   Nurse Communication  Mobility status        Time: 8676-8648 OT Time Calculation (min): 28 min  Charges: OT General Charges $OT Visit: 1 Visit OT Treatments $Neuromuscular Re-education: 23-37 mins  Rogers Clause, OT/L MSOT, 10/30/2023

## 2023-10-30 NOTE — TOC Progression Note (Signed)
 Transition of Care [facility]) - Progression Note    Patient Details  Name: Parker Brown MRN: [id number] Date of Birth: 07/01/1955  Transition of [facility]) CM/SW Contact  [place] L Mataio Mele, [place] Phone Number: 10/30/2023, 4:54 PM  Clinical Narrative:     CSW met with patient. Spouse, Parker Brown, was at bedside. Patient was advised that CIR found patient to be inappropriate for inpatient rehab because patient does not have support at home. CSW inquired about STR. Parker Brown advised that that is a better option. Patient agreed. FL2 and bed search will be completed.   CSW contacted Parker Brown, sister, to discuss discharge planning. She advised that patient is able to return home however, she works. Support at home seems to be unstable but services can be added to the home for an extra layer of support.       Barriers to Discharge: Continued Medical Work up               Expected Discharge Plan and Services       Living arrangements for the past 2 months: Single Family Home                                       Social Drivers of Health (SDOH) Interventions SDOH Screenings   Food Insecurity: No Food Insecurity (10/24/2023)  Housing: Low Risk  (10/24/2023)  Transportation Needs: No Transportation Needs (10/24/2023)  Utilities: Not At Risk (10/24/2023)  Social Connections: Socially Isolated (10/24/2023)  Tobacco Use: High Risk (10/23/2023)    Readmission Risk Interventions    10/27/2023   11:45 AM  Readmission Risk Prevention Plan  Transportation Screening Complete  PCP or Specialist Appt within 5-7 Days Complete  Medication Review (RN CM) Complete

## 2023-10-30 NOTE — Progress Notes (Signed)
 Progress Note   Patient: Parker Brown FMW:[id number] DOB: 10/08/1955 DOA: 10/23/2023     6 DOS: the patient was seen and examined on 10/30/2023   Brief hospital course: Hospital course / significant events:   68 y.o male with significant PMHx of COPD, polysubstance abuse (cocaine, marijuana, current everyday smoker), HTN, HLD, and arthritis who presented to the ED with chief complaints of right-sided weakness and a fall.   HPI: acute onset of right sided weakness which caused him to fall while walking to a neighbor's house. He denied hitting his head, LOC or any other associated symptoms. Patient admitted to smoking and using cocaine prior to presenting to the ED. On EMS arrival patient was noted with blood pressure of 204/104. 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 Headache reported in PM, CT head repeated 10/19: feels more weak in the right side, able to verbalize and follow commands. Repeat CT head ordered given NIH increased to 15. 10/20 persistent R hemiplegia. Weaned off cleviprex and ok to transfer to hospitalist. Plan placement for  inpatient rehab  10/21: EEG done pending read. If no concerns there can dc, if epileptiform can d/w neiro re: Rx but should not hold up dc  10/22.  EEG did not show any seizures but did show cortical dysfunction in the left hemisphere.  Patient had some agitation in the afternoon. 10/23.  Patient was able to move his right leg for me.  Patient has shoulder twitch right side. 10/24.  Patient able to straight leg raise with his right leg and flex in his right knee.  Patient able to lift right arm up off the bed but incoordinated with his hand and shoulder.  Consultants:  PCCU initially admitted to ICU Neurology Palliative care    Procedures/Surgeries: none        Assessment and Plan: * Acute hemorrhagic infarction of brain [facility]) Neurology recommends no antiplatelet agents at this time.  Patient has right-sided weakness.  Patient able to straight leg raise with his right leg and flexed at the knee.  Not much movement with right ankle and foot.  Patient able to lift his right arm up off the bed but incoordinated with her shoulder and not much movement with the hand.  Acute metabolic encephalopathy 2 days ago needed medications for agitation.  Last 2 days he has not been agitated for me.  I think the Ativan  probably made him worse.  Continue Seroquel at night.  Hypertensive emergency Secondary to cocaine.  Blood pressure much improved.  Patient on Norvasc  (lower dose today) and Cozaar  Cocaine abuse (HCC) Must stop this.  Alcohol abuse Does not seem to be withdrawing but likely worsened with Ativan .  Discontinued Ativan  the other day        Subjective: Patient able to straight leg raise with his right leg.  Interested in getting moving and moving around.  Still needs help with physical therapy to move around.  Admitted with hemorrhagic stroke.  Physical Exam: Vitals:   10/29/23 1928 10/30/23 0438 10/30/23 0452 10/30/23 0921  BP: 116/72 97/74  (!) 119/92  Pulse: 90 86  90  Resp: 16 15  18   Temp: 98.2 F (36.8 C) 97.9 F (36.6 C)  98.1 F (36.7 C)  TempSrc:      SpO2: 100% 99%  100%  Weight:   49.8 kg   Height:       Physical Exam HENT:     Head: Normocephalic.  Eyes:     General: Lids are normal.  Cardiovascular:     Rate and Rhythm: Normal rate and regular rhythm.     Heart sounds: Normal heart sounds, S1 normal and S2 normal.  Pulmonary:     Breath sounds: No decreased breath sounds, wheezing, rhonchi or rales.  Abdominal:     Palpations: Abdomen is soft.     Tenderness: There is no abdominal tenderness.  Musculoskeletal:     Right lower leg: No swelling.     Left lower leg: No  swelling.  Skin:    General: Skin is warm.     Findings: No rash.  Neurological:     Mental Status: He is alert.     Comments: Patient able to straight leg raise with right leg and flex at the knee.  Not much movement with flexing his right foot though.  Patient able to lift his right arm up off the bed.  Not much movement with his hand.  Incoordinated with his shoulder.     Data Reviewed: Last creatinine 1.27  Family Communication: Spoke with sister on the phone  Disposition: Status is: Inpatient Remains inpatient appropriate because: Will have transitional care team reach out to acute inpatient rehab and if they do not take we will have to go out to a subacute rehab.  Will need to discontinue sitter prior to going out to subacute rehab.   Planned Discharge Destination: Acute inpatient rehab versus subacute rehab    Time spent: 28 minutes  Author: Charlie Patterson, MD 10/30/2023 1:31 PM  For on call review [email or web address].

## 2023-10-30 NOTE — Plan of Care (Signed)
  Problem: Education: Goal: Knowledge of disease or condition will improve Outcome: Progressing Goal: Knowledge of secondary prevention will improve (MUST DOCUMENT ALL) Outcome: Progressing Goal: Knowledge of patient specific risk factors will improve (DELETE if not current risk factor) Outcome: Progressing   Problem: Intracerebral Hemorrhage Tissue Perfusion: Goal: Complications of Intracerebral Hemorrhage will be minimized Outcome: Progressing   Problem: Coping: Goal: Will verbalize positive feelings about self Outcome: Progressing Goal: Will identify appropriate support needs Outcome: Progressing   Problem: Health Behavior/Discharge Planning: Goal: Ability to manage health-related needs will improve Outcome: Progressing Goal: Goals will be collaboratively established with patient/family Outcome: Progressing   Problem: Self-Care: Goal: Ability to participate in self-care as condition permits will improve Outcome: Progressing Goal: Verbalization of feelings and concerns over difficulty with self-care will improve Outcome: Progressing Goal: Ability to communicate needs accurately will improve Outcome: Progressing   Problem: Nutrition: Goal: Risk of aspiration will decrease Outcome: Progressing Goal: Dietary intake will improve Outcome: Progressing   Problem: Education: Goal: Knowledge of General Education information will improve Description: Including pain rating scale, medication(s)/side effects and non-pharmacologic comfort measures Outcome: Progressing   Problem: Health Behavior/Discharge Planning: Goal: Ability to manage health-related needs will improve Outcome: Progressing   Problem: Clinical Measurements: Goal: Ability to maintain clinical measurements within normal limits will improve Outcome: Progressing Goal: Will remain free from infection Outcome: Progressing Goal: Diagnostic test results will improve Outcome: Progressing Goal: Respiratory  complications will improve Outcome: Progressing Goal: Cardiovascular complication will be avoided Outcome: Progressing   Problem: Activity: Goal: Risk for activity intolerance will decrease Outcome: Progressing   Problem: Nutrition: Goal: Adequate nutrition will be maintained Outcome: Progressing   Problem: Coping: Goal: Level of anxiety will decrease Outcome: Progressing   Problem: Elimination: Goal: Will not experience complications related to bowel motility Outcome: Progressing Goal: Will not experience complications related to urinary retention Outcome: Progressing   Problem: Pain Managment: Goal: General experience of comfort will improve and/or be controlled Outcome: Progressing   Problem: Safety: Goal: Ability to remain free from injury will improve Outcome: Progressing   Problem: Skin Integrity: Goal: Risk for impaired skin integrity will decrease Outcome: Progressing   Problem: Education: Goal: Knowledge of disease or condition will improve Outcome: Progressing Goal: Knowledge of secondary prevention will improve (MUST DOCUMENT ALL) Outcome: Progressing Goal: Knowledge of patient specific risk factors will improve (DELETE if not current risk factor) Outcome: Progressing   Problem: Ischemic Stroke/TIA Tissue Perfusion: Goal: Complications of ischemic stroke/TIA will be minimized Outcome: Progressing   Problem: Coping: Goal: Will verbalize positive feelings about self Outcome: Progressing Goal: Will identify appropriate support needs Outcome: Progressing   Problem: Health Behavior/Discharge Planning: Goal: Ability to manage health-related needs will improve Outcome: Progressing Goal: Goals will be collaboratively established with patient/family Outcome: Progressing   Problem: Self-Care: Goal: Ability to participate in self-care as condition permits will improve Outcome: Progressing Goal: Verbalization of feelings and concerns over difficulty with  self-care will improve Outcome: Progressing Goal: Ability to communicate needs accurately will improve Outcome: Progressing   Problem: Nutrition: Goal: Risk of aspiration will decrease Outcome: Progressing Goal: Dietary intake will improve Outcome: Progressing

## 2023-10-30 NOTE — Progress Notes (Addendum)
 Physical Therapy Treatment Patient Details Name: Parker Brown MRN: [id number] DOB: [birth year] Today's Date: 10/30/2023   History of Present Illness Patient is a 68 year old male with fall, acute left basal ganglia ICH, hypertensive emergency. Repeat head CT stable. PMH: hypertension and polysubstance abuse    PT Comments  Pt was long sitting in bed with sitter present upon arrival. He is alert and O x 3. Endorse eagerness to participate and remains motivated throughout. Pt performed AAROM on BUE/BLEs to promote strengthening and return in abilities. He continues to demonstrate improvements in strength and abilities however remains far form his baseline. Pt perform log roll R to short sit with increased time and tcs/vcs for sequencing. Stood from slightly elevated bed height with RUE in walker splint and Author blocking R knee from buckling. Pt has severe R lateral lean but with assistance and vcs was able to correct. With author blocking R LE from buckling, pt could clear LLE to take steps.  Easily able to clear RLE from floor but needs vcs and assistance to prevent R lateral LOB. Pt does remain at high risk of falls. Author encourages +2 assistance for any standing/OOB activity. Acute PT will continue to follow and progress per current POC.    If plan is discharge home, recommend the following: Two people to help with walking and/or transfers;Two people to help with bathing/dressing/bathroom;Assistance with cooking/housework;Direct supervision/assist for medications management;Direct supervision/assist for financial management;Assist for transportation;Help with stairs or ramp for entrance;Supervision due to cognitive status     Equipment Recommendations  Other (comment) (Defer to next level of care)       Precautions / Restrictions Precautions Precautions: Fall Recall of Precautions/Restrictions: Impaired Precaution/Restrictions Comments: SBP< 150 Restrictions Weight Bearing  Restrictions Per Provider Order: No     Mobility  Bed Mobility Overal bed mobility: Needs Assistance Bed Mobility: Supine to Sit, Sit to Supine Rolling: Min assist Supine to sit: Mod assist Sit to supine: Max assist   Transfers Overall transfer level: Needs assistance Equipment used: Rolling walker (2 wheels) (RUE walkersplint) Transfers: Sit to/from Stand Sit to Stand: Max assist  General transfer comment: Pt stood EOB 3 x with RUE in walkersplint with ace wrap holding hand in in splint and Author blocking R knee. Tolerated standing EOB ~ 20-30 sec each attempt. Was able to clear LLE then RLE from floor. Author blocks R knee from buckling. Pt tends to have severe R lateral lean but with assist and vcs was able to correct with increased time.    Ambulation/Gait  General Gait Details: unsafe to attempt gait away form EOB at this time. will need +2 assist for future gait attempts    Balance Overall balance assessment: Needs assistance Sitting-balance support: Feet unsupported, Single extremity supported Sitting balance-Leahy Scale: Fair     Standing balance support: Bilateral upper extremity supported, During functional activity, Reliant on assistive device for balance Standing balance-Leahy Scale: Poor Standing balance comment: requires constant assist to prevent falling Right       Communication Communication Communication: Impaired Factors Affecting Communication: Difficulty expressing self  Cognition Arousal: Alert Behavior During Therapy: WFL for tasks assessed/performed   PT - Cognitive impairments: Safety/Judgement, Problem solving, Awareness   Orientation impairments: Time   PT - Cognition Comments: Pt is alert, calm, and able to follow commands consistently throughout. Following commands: Impaired Following commands impaired: Follows one step commands with increased time    Cueing Cueing Techniques: Verbal cues, Tactile cues     General Comments General  comments (skin integrity, edema, etc.): Pt perform AAROM RUE/RLE in bed/EOB prior to getting OOB      Pertinent Vitals/Pain Pain Assessment Pain Score: 0-No pain Faces Pain Scale: No hurt     PT Goals (current goals can now be found in the care plan section) Acute Rehab PT Goals Patient Stated Goal: none stated Progress towards PT goals: Progressing toward goals    Frequency    Min 3X/week           Co-evaluation     PT goals addressed during session: Mobility/safety with mobility;Balance;Proper use of DME;Strengthening/ROM        AM-PAC PT 6 Clicks Mobility   Outcome Measure  Help needed turning from your back to your side while in a flat bed without using bedrails?: A Little Help needed moving from lying on your back to sitting on the side of a flat bed without using bedrails?: A Lot Help needed moving to and from a bed to a chair (including a wheelchair)?: A Lot Help needed standing up from a chair using your arms (e.g., wheelchair or bedside chair)?: A Lot Help needed to walk in hospital room?: Total Help needed climbing 3-5 steps with a railing? : Total 6 Click Score: 11    End of Session   Activity Tolerance: Patient tolerated treatment well Patient left: in bed;with call bell/phone within reach;with nursing/sitter in room (sitter present) Nurse Communication: Mobility status PT Visit Diagnosis: Hemiplegia and hemiparesis;Other abnormalities of gait and mobility (R26.89) Hemiplegia - Right/Left: Right Hemiplegia - dominant/non-dominant: Dominant Hemiplegia - caused by: Nontraumatic intracerebral hemorrhage     Time: 1002-1019 PT Time Calculation (min) (ACUTE ONLY): 17 min  Charges:    $Neuromuscular Re-education: 8-22 mins PT General Charges $$ ACUTE PT VISIT: 1 Visit                    Rankin Essex PTA 10/30/23, 2:00 PM

## 2023-10-30 NOTE — NC FL2 (Signed)
 [place]  MEDICAID FL2 LEVEL OF CARE FORM     IDENTIFICATION  Patient Name: Parker Brown Birthdate: 1955/11/08 Sex: male Admission Date (Current Location): 10/23/2023  [facility] and [place] Number:  Chiropodist and Address:  [facility], [address] [place], [place] [address]      Provider Number: [id number]  Attending Physician Name and Address:  Josette Ade, MD  Relative Name and Phone Number:  Aylen Rambert [phone]    Current Level of Care: Hospital Recommended Level of Care: Skilled Nursing Facility Prior Approval Number:    Date Approved/Denied:   PASRR Number:    Discharge Plan: SNF    Current Diagnoses: Patient Active Problem List   Diagnosis Date Noted   Weakness of right lower extremity 10/29/2023   Weakness of right upper extremity 10/29/2023   Cocaine abuse (HCC) 10/28/2023   Alcohol abuse 10/28/2023   Acute metabolic encephalopathy 10/28/2023   Paralysis (HCC) 10/28/2023   Acute hemorrhagic infarction of brain (HCC) 10/24/2023   Intraparenchymal hemorrhage of brain (HCC) 10/24/2023   Hypertensive emergency 10/24/2023   Posttraumatic stress disorder 10/19/2022   Osteoarthritis 10/19/2022   Chronic low back pain 10/19/2022   Diarrhea 10/19/2022   Diverticulosis of colon 10/19/2022   Chronic hepatitis C (HCC) 10/19/2022   History of tobacco use 03/15/2022   Polysubstance abuse (HCC) 03/15/2022   Insomnia 03/15/2022   Musculoskeletal chest pain 03/15/2022   COPD exacerbation (HCC) 01/10/2018   COPD with acute exacerbation (HCC) 06/09/2017   HTN (hypertension) 06/09/2017   HLD (hyperlipidemia) 06/09/2017   Carpal tunnel syndrome 01/07/1999    Orientation RESPIRATION BLADDER Height & Weight     Self, Time, Situation, Place  Normal Continent Weight: 109 lb 12.6 oz (49.8 kg) Height:  5' 5 (165.1 cm)  BEHAVIORAL SYMPTOMS/MOOD NEUROLOGICAL BOWEL NUTRITION STATUS      Continent Diet  (Aspiration precautions starting at 10/19 1439  Diet regular Fluid consistency: Thin: General starting at 10/18 0849  Seizure precautions starting at 10/18 0141  Fall precautions starting at 10/18 0138)  AMBULATORY STATUS COMMUNICATION OF NEEDS Skin   Extensive Assist Verbally Normal                       Personal Care Assistance Level of Assistance  Bathing, Feeding, Dressing Bathing Assistance: Maximum assistance Feeding assistance: Limited assistance Dressing Assistance: Maximum assistance     Functional Limitations Info  Sight, Hearing, Speech          SPECIAL CARE FACTORS FREQUENCY  PT (By licensed PT), OT (By licensed OT)     PT Frequency: 3x OT Frequency: 3x            Contractures Contractures Info: Not present    Additional Factors Info  Allergies, Code Status Code Status Info: FULL Allergies Info: Peroxide; Fish Oil; Methocarbamol; Pineapple; Carbomer; Tuberculin           Current Medications (10/30/2023):  This is the current hospital active medication list Current Facility-Administered Medications  Medication Dose Route Frequency Provider Last Rate Last Admin   acetaminophen  (TYLENOL ) tablet 650 mg  650 mg Oral Q4H PRN Kathrene Almarie Bake, NP   650 mg at 10/28/23 0255   Or   acetaminophen  (TYLENOL ) 160 MG/5ML solution 650 mg  650 mg Per Tube Q4H PRN Kathrene Almarie Bake, NP       Or   acetaminophen  (TYLENOL ) suppository 650 mg  650 mg Rectal Q4H PRN Kathrene Almarie Bake, NP  amLODipine  (NORVASC ) tablet 5 mg  5 mg Oral Daily Josette Ade, MD   5 mg at 10/30/23 1054   docusate sodium  (COLACE) capsule 100 mg  100 mg Oral BID PRN Kathrene Almarie Bake, NP       folic acid (FOLVITE) tablet 1 mg  1 mg Oral Daily Lindzen, Eric, MD   1 mg at 10/30/23 1055   haloperidol lactate (HALDOL) injection 1 mg  1 mg Intravenous Q6H PRN Josette Ade, MD   1 mg at 10/28/23 2237   insulin aspart (novoLOG) injection 0-9 Units  0-9 Units  Subcutaneous TID WC Kasa, Kurian, MD   1 Units at 10/30/23 1210   losartan (COZAAR) tablet 100 mg  100 mg Oral Daily Alexander, Natalie, DO   100 mg at 10/30/23 1054   morphine  (PF) 2 MG/ML injection 2 mg  2 mg Intravenous Q4H PRN Mansy, Jan A, MD       multivitamin with minerals tablet 1 tablet  1 tablet Oral Daily Lindzen, Eric, MD   1 tablet at 10/30/23 1054   polyethylene glycol (MIRALAX  / GLYCOLAX ) packet 17 g  17 g Oral Daily PRN Kathrene Almarie Bake, NP       QUEtiapine (SEROQUEL) tablet 25 mg  25 mg Oral QHS Josette Ade, MD   25 mg at 10/29/23 2031   senna-docusate (Senokot-S) tablet 1 tablet  1 tablet Oral BID Ouma, Elizabeth Achieng, NP   1 tablet at 10/30/23 1055   thiamine (VITAMIN B1) tablet 100 mg  100 mg Oral Daily Lindzen, Eric, MD   100 mg at 10/30/23 1054   Or   thiamine (VITAMIN B1) injection 100 mg  100 mg Intravenous Daily Lindzen, Eric, MD   100 mg at 10/26/23 9074   traZODone  (DESYREL ) tablet 25 mg  25 mg Oral QHS PRN Mansy, Jan A, MD   25 mg at 10/29/23 2031     Discharge Medications: Please see discharge summary for a list of discharge medications.  Relevant Imaging Results:  Relevant Lab Results:   Additional Information [id number]  [place] L Kriss Ishler, LCSW

## 2023-10-31 DIAGNOSIS — F1410 Cocaine abuse, uncomplicated: Secondary | ICD-10-CM | POA: Diagnosis not present

## 2023-10-31 DIAGNOSIS — G9341 Metabolic encephalopathy: Secondary | ICD-10-CM | POA: Diagnosis not present

## 2023-10-31 DIAGNOSIS — Z515 Encounter for palliative care: Secondary | ICD-10-CM | POA: Diagnosis not present

## 2023-10-31 DIAGNOSIS — I161 Hypertensive emergency: Secondary | ICD-10-CM | POA: Diagnosis not present

## 2023-10-31 DIAGNOSIS — R29898 Other symptoms and signs involving the musculoskeletal system: Secondary | ICD-10-CM | POA: Diagnosis not present

## 2023-10-31 DIAGNOSIS — I6389 Other cerebral infarction: Secondary | ICD-10-CM | POA: Diagnosis not present

## 2023-10-31 LAB — CBC
HCT: 37.9 % — ABNORMAL LOW (ref 39.0–52.0)
Hemoglobin: 12.4 g/dL — ABNORMAL LOW (ref 13.0–17.0)
MCH: 29.5 pg (ref 26.0–34.0)
MCHC: 32.7 g/dL (ref 30.0–36.0)
MCV: 90.2 fL (ref 80.0–100.0)
Platelets: 245 K/uL (ref 150–400)
RBC: 4.2 MIL/uL — ABNORMAL LOW (ref 4.22–5.81)
RDW: 13.9 % (ref 11.5–15.5)
WBC: 4.3 K/uL (ref 4.0–10.5)
nRBC: 0 % (ref 0.0–0.2)

## 2023-10-31 LAB — BASIC METABOLIC PANEL WITH GFR
Anion gap: 10 (ref 5–15)
BUN: 26 mg/dL — ABNORMAL HIGH (ref 8–23)
CO2: 23 mmol/L (ref 22–32)
Calcium: 8.6 mg/dL — ABNORMAL LOW (ref 8.9–10.3)
Chloride: 103 mmol/L (ref 98–111)
Creatinine, Ser: 1.19 mg/dL (ref 0.61–1.24)
GFR, Estimated: >60 mL/min (ref >60)
Glucose, Bld: 105 mg/dL — ABNORMAL HIGH (ref 70–99)
Potassium: 4.1 mmol/L (ref 3.5–5.1)
Sodium: 136 mmol/L (ref 135–145)

## 2023-10-31 LAB — GLUCOSE, CAPILLARY
Glucose-Capillary: 124 mg/dL — ABNORMAL HIGH (ref 70–99)
Glucose-Capillary: 98 mg/dL (ref 70–99)

## 2023-10-31 MED ORDER — AMLODIPINE BESYLATE 10 MG PO TABS
10.0000 mg | ORAL_TABLET | Freq: Every day | ORAL | Status: DC
  Administered 2023-11-01 – 2023-11-10 (×10): 10 mg via ORAL
  Filled 2023-10-31 (×10): qty 1

## 2023-10-31 MED ORDER — AMLODIPINE BESYLATE 5 MG PO TABS
5.0000 mg | ORAL_TABLET | Freq: Once | ORAL | Status: AC
  Administered 2023-10-31: 5 mg via ORAL
  Filled 2023-10-31: qty 1

## 2023-10-31 NOTE — Progress Notes (Signed)
 Physical Therapy Treatment Patient Details Name: Parker Brown MRN: [id number] DOB: 1955/11/08 Today's Date: 10/31/2023   History of Present Illness Patient is a 68 year old male with fall, acute left basal ganglia ICH, hypertensive emergency. Repeat head CT stable. PMH: hypertension and polysubstance abuse    PT Comments  Pt is progressing with mobility training: multiple sit<>stands with force used technique (pt holding onto R UE with LUE and reaching forward to stand<>sit) to increase R side involvment.  Gait training:  (vc, manual, visual cues) for upright posture, weight shifting to the L to step with R, facilitation of R foot placement and R knee stability. 15'x2 with R Platform RW (Max A +2) and recliner following.  Pt continues to experience limitations to mobility, demonstrating decreased activity tolerance, R side weakness, and incoordination.  Acute PT will continue to follow and progress per current POC.       If plan is discharge home, recommend the following: Two people to help with walking and/or transfers;Two people to help with bathing/dressing/bathroom;Assistance with cooking/housework;Direct supervision/assist for medications management;Direct supervision/assist for financial management;Assist for transportation;Help with stairs or ramp for entrance;Supervision due to cognitive status   Can travel by private vehicle     No  Equipment Recommendations  Other (comment) (TBD)    Recommendations for Other Services Rehab consult     Precautions / Restrictions Precautions Precautions: Fall Recall of Precautions/Restrictions: Impaired Precaution/Restrictions Comments: SBP< 150 Restrictions Weight Bearing Restrictions Per Provider Order: No     Mobility  Bed Mobility Overal bed mobility: Needs Assistance Bed Mobility: Supine to Sit Rolling: Supervision   Supine to sit: Supervision     General bed mobility comments: cues for body mechanics to self  facilitate R UE/LE movement    Transfers Overall transfer level: Needs assistance Equipment used: Right platform walker Transfers: Sit to/from Stand, Bed to chair/wheelchair/BSC Sit to Stand: Mod assist   Step pivot transfers: Mod assist, +2 physical assistance       General transfer comment: multiple sit<>stands with force used technique (pt holding onto R UE with LUE and reaching forward to stand<>sit) to increase R side involvment.    Ambulation/Gait Ambulation/Gait assistance: Max assist, +2 physical assistance Gait Distance (Feet): 15 Feet (x2) Assistive device: Right platform walker Gait Pattern/deviations: Step-through pattern, Step-to pattern, Decreased stride length, Decreased dorsiflexion - right, Decreased weight shift to right, Knee hyperextension - right, Trunk flexed       General Gait Details: training (vc, manual, visual cues) for upright posture, weight shifting to the L to step with R, facilitation of R foot placement and R knee stability.   Stairs             Wheelchair Mobility     Tilt Bed    Modified Rankin (Stroke Patients Only)       Balance Overall balance assessment: Needs assistance Sitting-balance support: Feet supported, No upper extremity supported Sitting balance-Leahy Scale: Fair Sitting balance - Comments: less leaning to the L in sitting. Postural control: Posterior lean, Left lateral lean Standing balance support: Bilateral upper extremity supported, During functional activity, Reliant on assistive device for balance Standing balance-Leahy Scale: Poor Standing balance comment: constant steadying A                            Communication Communication Communication: Impaired Factors Affecting Communication: Difficulty expressing self  Cognition Arousal: Alert Behavior During Therapy: WFL for tasks assessed/performed   PT - Cognitive  impairments: Safety/Judgement, Problem solving, Awareness                        PT - Cognition Comments: Pt is alert, calm, and able to follow commands consistently throughout. Following commands: Impaired Following commands impaired: Follows one step commands with increased time    Cueing Cueing Techniques: Verbal cues, Tactile cues  Exercises      General Comments        Pertinent Vitals/Pain Pain Assessment Pain Assessment: No/denies pain    Home Living                          Prior Function            PT Goals (current goals can now be found in the care plan section) Acute Rehab PT Goals Patient Stated Goal: none stated Time For Goal Achievement: 11/08/23 Potential to Achieve Goals: Fair Progress towards PT goals: Progressing toward goals    Frequency    Min 3X/week      PT Plan      Co-evaluation              AM-PAC PT 6 Clicks Mobility   Outcome Measure  Help needed turning from your back to your side while in a flat bed without using bedrails?: A Little Help needed moving from lying on your back to sitting on the side of a flat bed without using bedrails?: A Little Help needed moving to and from a bed to a chair (including a wheelchair)?: A Lot Help needed standing up from a chair using your arms (e.g., wheelchair or bedside chair)?: A Lot Help needed to walk in hospital room?: A Lot Help needed climbing 3-5 steps with a railing? : Total 6 Click Score: 13    End of Session Equipment Utilized During Treatment: Gait belt Activity Tolerance: Patient tolerated treatment well;Patient limited by fatigue Patient left: in chair;with call bell/phone within reach;with chair alarm set;with family/visitor present Nurse Communication: Mobility status PT Visit Diagnosis: Hemiplegia and hemiparesis;Other abnormalities of gait and mobility (R26.89) Hemiplegia - Right/Left: Right Hemiplegia - dominant/non-dominant: Dominant Hemiplegia - caused by: Nontraumatic intracerebral hemorrhage     Time:  1400-1430 PT Time Calculation (min) (ACUTE ONLY): 30 min  Charges:    $Therapeutic Activity: 8-22 mins $Neuromuscular Re-education: 8-22 mins PT General Charges $$ ACUTE PT VISIT: 1 Visit                     Harland Irving, PTA  10/31/23, 2:51 PM

## 2023-10-31 NOTE — Progress Notes (Signed)
 Progress Note   Patient: Parker Brown FMW:[id number] DOB: 1955/11/12 DOA: 10/23/2023     7 DOS: the patient was seen and examined on 10/31/2023   Brief hospital course: Hospital course / significant events:   68 y.o male with significant PMHx of COPD, polysubstance abuse (cocaine, marijuana, current everyday smoker), HTN, HLD, and arthritis who presented to the ED with chief complaints of right-sided weakness and a fall.   HPI: acute onset of right sided weakness which caused him to fall while walking to a neighbor's house. He denied hitting his head, LOC or any other associated symptoms. Patient admitted to smoking and using cocaine prior to presenting to the ED. On EMS arrival patient was noted with blood pressure of 204/104. 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 Headache reported in PM, CT head repeated 10/19: feels more weak in the right side, able to verbalize and follow commands. Repeat CT head ordered given NIH increased to 15. 10/20 persistent R hemiplegia. Weaned off cleviprex and ok to transfer to hospitalist. Plan placement for  inpatient rehab  10/21: EEG done pending read. If no concerns there can dc, if epileptiform can d/w neiro re: Rx but should not hold up dc  10/22.  EEG did not show any seizures but did show cortical dysfunction in the left hemisphere.  Patient had some agitation in the afternoon. 10/23.  Patient was able to move his right leg for me.  Patient has shoulder twitch right side. 10/24.  Patient able to straight leg raise with his right leg and flex in his right knee.  Patient able to lift right arm up off the bed but incoordinated with his hand and shoulder. 10/25.  Patient advised to continue doing exercises in the bed with straight leg raises and  moving his arm.  Consultants:  PCCU initially admitted to ICU Neurology Palliative care   Procedures/Surgeries: none        Assessment and Plan: * Acute hemorrhagic infarction of brain [facility]) Neurology recommends no antiplatelet agents at this time.  Patient has right-sided weakness.  Patient able to straight leg raise with his right leg and flex at the knee.  Not much movement with right ankle and foot.  Patient able to lift his right arm up off the bed but incoordinated with her shoulder and slight grip with the right hand  Acute metabolic encephalopathy Much improved.  I think Ativan  earlier in the hospitalization made him worse.  Hypertensive emergency Secondary to cocaine.  Blood pressure much improved.  Patient on Norvasc  will increase dose up to 10 mg daily and Cozaar  Cocaine abuse (HCC) Must stop this.  Alcohol abuse No signs of withdrawal during the hospital course.  I think when they gave him Ativan  it did make him worse.  Ativan  was discontinued        Subjective: Patient feels okay.  He is working on moving his right arm and right leg.  Continue working hard with physical therapy.  Admitted with acute hemorrhagic stroke  Physical Exam: Vitals:   10/30/23 2039 10/31/23 0456 10/31/23 0730 10/31/23 1515  BP: (!) 140/98 (!) 132/94 134/85 (!) 135/92  Pulse: 95 79 88 (!) 104  Resp: 18 18 18 16   Temp: 98.3 F (36.8 C) 98.3 F (36.8 C) 98.3 F (36.8 C) 98.5 F (36.9  C)  TempSrc:      SpO2: 100% 100% 99% 100%  Weight:      Height:       Physical Exam HENT:     Head: Normocephalic.  Eyes:     General: Lids are normal.  Cardiovascular:     Rate and Rhythm: Normal rate and regular rhythm.     Heart sounds: Normal heart sounds, S1 normal and S2 normal.  Pulmonary:     Breath sounds: No decreased breath sounds, wheezing, rhonchi or rales.  Abdominal:     Palpations: Abdomen is soft.     Tenderness: There is no abdominal tenderness.  Musculoskeletal:      Right lower leg: No swelling.     Left lower leg: No swelling.  Skin:    General: Skin is warm.     Findings: No rash.  Neurological:     Mental Status: He is alert.     Comments: Patient still not with much motion of his right foot but able to flex at the knee and straight leg raise with the right leg.  Patient with incoordination of his right arm.  Slight grip with the right hand but unable to extend fingers.  Able to bend at the elbow.     Data Reviewed: Last creatinine 1.19 last hemoglobin 12.4  Family Communication: Tried to reach sister on the phone  Disposition: Status is: Inpatient Remains inpatient appropriate because: TOC need to look into rehab beds.  Medically stable to go out to rehab once we obtain a bed  Planned Discharge Destination: Rehab    Time spent: 28 minutes  Author: Charlie Patterson, MD 10/31/2023 3:35 PM  For on call review [email or web address].

## 2023-10-31 NOTE — Plan of Care (Signed)
  Problem: Education: Goal: Knowledge of disease or condition will improve Outcome: Progressing Goal: Knowledge of secondary prevention will improve (MUST DOCUMENT ALL) Outcome: Progressing Goal: Knowledge of patient specific risk factors will improve (DELETE if not current risk factor) Outcome: Progressing   Problem: Intracerebral Hemorrhage Tissue Perfusion: Goal: Complications of Intracerebral Hemorrhage will be minimized Outcome: Progressing   Problem: Coping: Goal: Will verbalize positive feelings about self Outcome: Progressing Goal: Will identify appropriate support needs Outcome: Progressing   Problem: Health Behavior/Discharge Planning: Goal: Ability to manage health-related needs will improve Outcome: Progressing Goal: Goals will be collaboratively established with patient/family Outcome: Progressing   Problem: Self-Care: Goal: Ability to participate in self-care as condition permits will improve Outcome: Progressing Goal: Verbalization of feelings and concerns over difficulty with self-care will improve Outcome: Progressing Goal: Ability to communicate needs accurately will improve Outcome: Progressing   Problem: Nutrition: Goal: Risk of aspiration will decrease Outcome: Progressing Goal: Dietary intake will improve Outcome: Progressing   Problem: Education: Goal: Knowledge of General Education information will improve Description: Including pain rating scale, medication(s)/side effects and non-pharmacologic comfort measures Outcome: Progressing   Problem: Health Behavior/Discharge Planning: Goal: Ability to manage health-related needs will improve Outcome: Progressing   Problem: Clinical Measurements: Goal: Ability to maintain clinical measurements within normal limits will improve Outcome: Progressing Goal: Will remain free from infection Outcome: Progressing Goal: Diagnostic test results will improve Outcome: Progressing Goal: Respiratory  complications will improve Outcome: Progressing Goal: Cardiovascular complication will be avoided Outcome: Progressing   Problem: Activity: Goal: Risk for activity intolerance will decrease Outcome: Progressing   Problem: Nutrition: Goal: Adequate nutrition will be maintained Outcome: Progressing   Problem: Coping: Goal: Level of anxiety will decrease Outcome: Progressing   Problem: Elimination: Goal: Will not experience complications related to bowel motility Outcome: Progressing Goal: Will not experience complications related to urinary retention Outcome: Progressing   Problem: Pain Managment: Goal: General experience of comfort will improve and/or be controlled Outcome: Progressing   Problem: Safety: Goal: Ability to remain free from injury will improve Outcome: Progressing   Problem: Skin Integrity: Goal: Risk for impaired skin integrity will decrease Outcome: Progressing   Problem: Education: Goal: Knowledge of disease or condition will improve Outcome: Progressing Goal: Knowledge of secondary prevention will improve (MUST DOCUMENT ALL) Outcome: Progressing Goal: Knowledge of patient specific risk factors will improve (DELETE if not current risk factor) Outcome: Progressing   Problem: Ischemic Stroke/TIA Tissue Perfusion: Goal: Complications of ischemic stroke/TIA will be minimized Outcome: Progressing   Problem: Coping: Goal: Will verbalize positive feelings about self Outcome: Progressing Goal: Will identify appropriate support needs Outcome: Progressing   Problem: Health Behavior/Discharge Planning: Goal: Ability to manage health-related needs will improve Outcome: Progressing Goal: Goals will be collaboratively established with patient/family Outcome: Progressing   Problem: Self-Care: Goal: Ability to participate in self-care as condition permits will improve Outcome: Progressing Goal: Verbalization of feelings and concerns over difficulty with  self-care will improve Outcome: Progressing Goal: Ability to communicate needs accurately will improve Outcome: Progressing   Problem: Nutrition: Goal: Risk of aspiration will decrease Outcome: Progressing Goal: Dietary intake will improve Outcome: Progressing

## 2023-10-31 NOTE — Progress Notes (Signed)
 Speech Language Pathology Treatment: Cognitive-Linguistic  Patient Details Name: Parker Brown MRN: [id number] DOB: Jun 08, 1955 Today's Date: 10/31/2023 Time: 1100-1115 SLP Time Calculation (min) (ACUTE ONLY): 15 min  Assessment / Plan / Recommendation Clinical Impression  Pt seen for follow up cognitive communication intervention, targeting continued dynamic assessment and challenging expressive language. Pt with increased functional recall/awareness for plan to pursue SNF, tasks completed in recent therapy sessions, and improved function on right side. Verbal expression intact for functional language task (ordering lunch) with aid of extended time for response- noted anomic hesitation x2 with pt independently utilizing gesture for compensation x1. Sustained attention intact for duration of session. Pt continues to deny cognitive communication impairment- reporting at his baseline. Family not present for verification. RN reporting reduced restless/agitated behavior thus far during shift. Given ability to express needs/wants, sustain attention, and his emerging awareness/functional recall, no further acute SLP services indicated at this time. Recommend follow up SLP services at next venue of care (SNF).     HPI HPI: 68 y.o male with significant PMHx of COPD, Polysubstance abuse (Cocaine, marijuana, current everyday smoker), HTN, HLD, and arthritis who presented to the ED s/p Fall and complaints of right-sided weakness.  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 Patchy white matter hypodensities compatible with chronic microvascular ischemic disease.   CXR: No active disease. No metallic foreign body identified.   MRI 10/19:  Acute hemorrhagic infarct involving the left centrum semiovale, left corona  radiata, and external capsule with partial involvement of the  posterior left  lentiform nucleus. Similar appearance of intraparenchymal hematoma.  2. Surrounding edema and mild local mass effect. No significant midline shift.  3. Moderate chronic microvascular ischemic changes.      SLP Plan  All goals met (follow up next venue of care)          Recommendations  Diet recommendations: Regular Liquids provided via: Cup;Straw Medication Administration: Whole meds with liquid Supervision: Patient able to self feed Compensations: Minimize environmental distractions;Slow rate;Small sips/bites;Follow solids with liquid Postural Changes and/or Swallow Maneuvers: Out of bed for meals;Seated upright 90 degrees;Upright 30-60 min after meal                  Oral care BID;Staff/trained caregiver to provide oral care   Frequent or constant Supervision/Assistance Dysphagia, oral phase (R13.11);Cognitive communication deficit (R41.841)     All goals met (follow up next venue of care)    Lateka Rady Clapp, MS, CCC-SLP Speech Language Pathologist Rehab Services; [facility] [phone] (ascom)   [place] Jeff J Clapp  10/31/2023, 11:21 AM

## 2023-10-31 NOTE — Progress Notes (Signed)
 Daily Progress Note   Patient Name: Parker Brown       Date: 10/31/2023 DOB: [birth year]  Age: 68 y.o. MRN#: [id number] Attending Physician: Josette Ade, MD Primary Care Physician: Pcp, No Admit Date: 10/23/2023  Reason for Consultation/Follow-up: Establishing goals of care  HPI/Brief Hospital Review: 68 y.o. male  with past medical history of COPD, HTN, HLD, current tobacco use, polysubstance abuse (cocaine, marijuana), PTSD, osteoarthritis with chronic low back pain, diverticulosis, carpal tunnel syndrome, and chronic hep C admitted on 10/23/2023 with right sided weakness.   As per chart review, patient experienced right sided weakness resulting in fall while walking to her neighbor's house.  He was found outside lying on the ground by his sister who subsequently called EMS.    Upon presentation to the ED, patient was hypertensive.  Additionally, RN noted patient was obviously intoxicated (Etoh negative) and endorsed striking his head with no loss of consciousness during fall.   Stat noncontrast CT obtained which revealed acute left basal ganglia intraparenchymal hemorrhage with mild edema and minimal mass effect.  Follow-up CTA of head and neck revealed no acute intracranial abnormalities or LVO.   Patient is being treated for intracranial hemorrhage with hemorrhagic stroke, hypertensive emergency, and cocaine abuse.   PMT was consulted to support patient and family with goals of care discussions.  Subjective: Extensive chart review has been completed prior to meeting patient including labs, vital signs, imaging, progress notes, orders, and available advanced directive documents from current and previous encounters.    Visited with Parker Brown at his bedside.  He is  awake, alert and able to engage in conversation.  He lunch tray is at bedside and he is eating his cheeseburger without difficulty.  No family or visitors at bedside during time of visit.  Assess symptoms.  He denies acute pain or discomfort well at night and appetite seems to be improving.  Reviewed medical updates and plan to discharge to short-term rehab once bed available and insurance process approved.  Parker Brown shares his ex-wife [name] came to visit yesterday.  He confirms they have been legally divorced for many years but they remain very close friends.  Together they have 2 daughters.  He remains unable to provide contact information for his daughters.  Shares he was living in his family home for quite  some time before his sister Claud moved into the home from out of state.  He is clear to say he and his sister just live together, he does not rely on her for support.  Attempted to elicit goals of care.  Parker Brown shares in the event he is unable to make medical decisions for himself he wishes to appoint his ex-wife [name] as well as his 2 daughters collectively as merchant [employer] officer.  Parker Brown shares that I am able to call Allen to attempt to gather his daughter's contact information although he shares Allen will have communication with them regarding his health care.  We discussed CODE STATUS and the difference tween full code and DO NOT RESUSCITATE.  Parker Brown shares at this time he does not think that he would be accepting of resuscitative efforts but would like conversations to be had with Earnestine prior to making final decision.  Attempted call to [place], voicemail full.  Awaiting callback.  Answered and addressed all questions and concerns.  PMT to continue to follow for ongoing needs and support.  Objective:  Physical Exam Constitutional:      General: He is not in acute distress.    Appearance: He is ill-appearing.  HENT:     Head:  Normocephalic.  Pulmonary:     Effort: Pulmonary effort is normal. No respiratory distress.  Skin:    General: Skin is warm and dry.  Neurological:     Mental Status: He is alert and oriented to person, place, and time.     Motor: Weakness present.  Psychiatric:        Mood and Affect: Mood normal.        Behavior: Behavior normal.        Thought Content: Thought content normal.             Vital Signs: BP (!) 135/92 (BP Location: Right Arm)   Pulse (!) 104   Temp 98.5 F (36.9 C)   Resp 16   Ht 5' 5 (1.651 m)   Wt 49.8 kg   SpO2 100%   BMI 18.27 kg/m  SpO2: SpO2: 100 % O2 Device: O2 Device: Room Air O2 Flow Rate:     Palliative Care Assessment & Plan   Assessment/Recommendation/Plan  Continue to attempt to connect with Earnestine for goals of care discussion PMT to continue to follow for ongoing needs and support  Thank you for allowing the Palliative Medicine Team to assist in the care of this patient.  Visit includes: Detailed review of medical records (labs, imaging, vital signs), medically appropriate exam (mental status, respiratory, cardiac, skin), discussed with treatment team, counseling and educating patient, family and staff, documenting clinical information, medication management and coordination of care.  Waddell Lesches, DNP, AGNP-C Palliative Medicine   Please contact Palliative Medicine Team phone at [phone] for questions and concerns.

## 2023-11-01 DIAGNOSIS — Z7189 Other specified counseling: Secondary | ICD-10-CM | POA: Diagnosis not present

## 2023-11-01 DIAGNOSIS — I161 Hypertensive emergency: Secondary | ICD-10-CM | POA: Diagnosis not present

## 2023-11-01 DIAGNOSIS — F1410 Cocaine abuse, uncomplicated: Secondary | ICD-10-CM | POA: Diagnosis not present

## 2023-11-01 DIAGNOSIS — Z515 Encounter for palliative care: Secondary | ICD-10-CM | POA: Diagnosis not present

## 2023-11-01 DIAGNOSIS — I6389 Other cerebral infarction: Secondary | ICD-10-CM | POA: Diagnosis not present

## 2023-11-01 DIAGNOSIS — G9341 Metabolic encephalopathy: Secondary | ICD-10-CM | POA: Diagnosis not present

## 2023-11-01 DIAGNOSIS — R29898 Other symptoms and signs involving the musculoskeletal system: Secondary | ICD-10-CM | POA: Diagnosis not present

## 2023-11-01 LAB — GLUCOSE, CAPILLARY
Glucose-Capillary: 109 mg/dL — ABNORMAL HIGH (ref 70–99)
Glucose-Capillary: 109 mg/dL — ABNORMAL HIGH (ref 70–99)
Glucose-Capillary: 128 mg/dL — ABNORMAL HIGH (ref 70–99)

## 2023-11-01 NOTE — Progress Notes (Signed)
 Progress Note   Patient: Parker Brown FMW:[id number] DOB: Oct 29, 1955 DOA: 10/23/2023     8 DOS: the patient was seen and examined on 11/01/2023   Brief hospital course: Hospital course / significant events:   68 y.o male with significant PMHx of COPD, polysubstance abuse (cocaine, marijuana, current everyday smoker), HTN, HLD, and arthritis who presented to the ED with chief complaints of right-sided weakness and a fall.   HPI: acute onset of right sided weakness which caused him to fall while walking to a neighbor's house. He denied hitting his head, LOC or any other associated symptoms. Patient admitted to smoking and using cocaine prior to presenting to the ED. On EMS arrival patient was noted with blood pressure of 204/104. 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 Headache reported in PM, CT head repeated 10/19: feels more weak in the right side, able to verbalize and follow commands. Repeat CT head ordered given NIH increased to 15. 10/20 persistent R hemiplegia. Weaned off cleviprex and ok to transfer to hospitalist. Plan placement for  inpatient rehab  10/21: EEG done pending read. If no concerns there can dc, if epileptiform can d/w neiro re: Rx but should not hold up dc  10/22.  EEG did not show any seizures but did show cortical dysfunction in the left hemisphere.  Patient had some agitation in the afternoon. 10/23.  Patient was able to move his right leg for me.  Patient has shoulder twitch right side. 10/24.  Patient able to straight leg raise with his right leg and flex in his right knee.  Patient able to lift right arm up off the bed but incoordinated with his hand and shoulder. 10/25.  Patient advised to continue doing exercises in the bed with straight leg raises and  moving his arm. 10/26.  Patient able to extend with his right fingers and make a fist.  Patient able to lift up his right arm but still a little uncoordinated.  Still no movement right ankle and foot but able to flex and extend at the knee and hip.  Consultants:  PCCU initially admitted to ICU Neurology Palliative care   Procedures/Surgeries: none        Assessment and Plan: * Acute hemorrhagic infarction of brain [facility]) Neurology recommends no antiplatelet agents at this time.  Patient has right-sided weakness.  Patient able to straight leg raise with his right leg and flex at the knee.  Still no movement with right ankle and foot.  Patient able to lift his right arm up off the bed with incoordination.  Able to flex and extend at the elbow.  Today able to extend some of his fingers and make a fist.  Acute metabolic encephalopathy This problem has resolved.  I think Ativan  earlier in the hospitalization made him worse.  Hypertensive emergency Secondary to cocaine.  Blood pressure much improved.  Patient on Norvasc  and Cozaar  Cocaine abuse (HCC) Must stop this.  Alcohol abuse No signs of withdrawal during the hospital course.  I think when they gave him Ativan  it did make him worse.  Ativan  was discontinued        Subjective: Patient reminded not to get up by himself and asked for help since he cannot move his right foot.  Patient admitted with hemorrhagic stroke.  Getting better  movement on a daily basis for his right side.  Physical Exam: Vitals:   11/01/23 0500 11/01/23 0526 11/01/23 0757 11/01/23 0759  BP:  (!) 143/92 129/89 129/83  Pulse:  68 94 91  Resp:  16 14   Temp:  98.8 F (37.1 C) 98.1 F (36.7 C)   TempSrc:  Oral    SpO2:  100% 100% 100%  Weight: 52.7 kg     Height:       Physical Exam HENT:     Head: Normocephalic.  Eyes:     General: Lids are normal.  Cardiovascular:     Rate and Rhythm: Normal rate and regular rhythm.     Heart sounds:  Normal heart sounds, S1 normal and S2 normal.  Pulmonary:     Breath sounds: No decreased breath sounds, wheezing, rhonchi or rales.  Abdominal:     Palpations: Abdomen is soft.     Tenderness: There is no abdominal tenderness.  Musculoskeletal:     Right lower leg: No swelling.     Left lower leg: No swelling.  Skin:    General: Skin is warm.     Findings: No rash.  Neurological:     Mental Status: He is alert.     Comments: Patient unable to move his toes on the right foot or his ankle.  Able to flex and extend at the right knee and hip.  Today patient able to extend right fingers and make a fist.  Able to bend at the elbow and lift up his arm with his shoulder but still not coordinated with right arm.     Data Reviewed: Creatinine 1.19, hemoglobin 12.4  Family Communication: Tried to reach ex-wife.  Voicemail box full.  Disposition: Status is: Inpatient Remains inpatient appropriate because: Medically stable to go out to rehab once we obtain a bed Planned Discharge Destination: Rehab    Time spent: 28 minutes Case discussed with nursing staff  Author: Charlie Patterson, MD 11/01/2023 10:52 AM  For on call review [email or web address].

## 2023-11-01 NOTE — Plan of Care (Signed)
  Problem: Education: Goal: Knowledge of disease or condition will improve Outcome: Progressing Goal: Knowledge of secondary prevention will improve (MUST DOCUMENT ALL) Outcome: Progressing Goal: Knowledge of patient specific risk factors will improve (DELETE if not current risk factor) Outcome: Progressing   Problem: Intracerebral Hemorrhage Tissue Perfusion: Goal: Complications of Intracerebral Hemorrhage will be minimized Outcome: Progressing   Problem: Coping: Goal: Will verbalize positive feelings about self Outcome: Progressing Goal: Will identify appropriate support needs Outcome: Progressing   Problem: Health Behavior/Discharge Planning: Goal: Ability to manage health-related needs will improve Outcome: Progressing Goal: Goals will be collaboratively established with patient/family Outcome: Progressing   Problem: Self-Care: Goal: Ability to participate in self-care as condition permits will improve Outcome: Progressing Goal: Verbalization of feelings and concerns over difficulty with self-care will improve Outcome: Progressing Goal: Ability to communicate needs accurately will improve Outcome: Progressing   Problem: Nutrition: Goal: Risk of aspiration will decrease Outcome: Progressing Goal: Dietary intake will improve Outcome: Progressing   Problem: Education: Goal: Knowledge of General Education information will improve Description: Including pain rating scale, medication(s)/side effects and non-pharmacologic comfort measures Outcome: Progressing   Problem: Health Behavior/Discharge Planning: Goal: Ability to manage health-related needs will improve Outcome: Progressing   Problem: Clinical Measurements: Goal: Ability to maintain clinical measurements within normal limits will improve Outcome: Progressing Goal: Will remain free from infection Outcome: Progressing Goal: Diagnostic test results will improve Outcome: Progressing Goal: Respiratory  complications will improve Outcome: Progressing Goal: Cardiovascular complication will be avoided Outcome: Progressing   Problem: Activity: Goal: Risk for activity intolerance will decrease Outcome: Progressing   Problem: Nutrition: Goal: Adequate nutrition will be maintained Outcome: Progressing   Problem: Coping: Goal: Level of anxiety will decrease Outcome: Progressing   Problem: Elimination: Goal: Will not experience complications related to bowel motility Outcome: Progressing Goal: Will not experience complications related to urinary retention Outcome: Progressing   Problem: Pain Managment: Goal: General experience of comfort will improve and/or be controlled Outcome: Progressing   Problem: Safety: Goal: Ability to remain free from injury will improve Outcome: Progressing   Problem: Skin Integrity: Goal: Risk for impaired skin integrity will decrease Outcome: Progressing   Problem: Education: Goal: Knowledge of disease or condition will improve Outcome: Progressing Goal: Knowledge of secondary prevention will improve (MUST DOCUMENT ALL) Outcome: Progressing Goal: Knowledge of patient specific risk factors will improve (DELETE if not current risk factor) Outcome: Progressing   Problem: Ischemic Stroke/TIA Tissue Perfusion: Goal: Complications of ischemic stroke/TIA will be minimized Outcome: Progressing   Problem: Coping: Goal: Will verbalize positive feelings about self Outcome: Progressing Goal: Will identify appropriate support needs Outcome: Progressing   Problem: Health Behavior/Discharge Planning: Goal: Ability to manage health-related needs will improve Outcome: Progressing Goal: Goals will be collaboratively established with patient/family Outcome: Progressing   Problem: Self-Care: Goal: Ability to participate in self-care as condition permits will improve Outcome: Progressing Goal: Verbalization of feelings and concerns over difficulty with  self-care will improve Outcome: Progressing Goal: Ability to communicate needs accurately will improve Outcome: Progressing   Problem: Nutrition: Goal: Risk of aspiration will decrease Outcome: Progressing Goal: Dietary intake will improve Outcome: Progressing

## 2023-11-01 NOTE — TOC Progression Note (Signed)
 Transition of Care [facility]) - Progression Note    Patient Details  Name: Parker Brown MRN: [id number] Date of Birth: 08-12-1955  Transition of Care [facility]) CM/SW Contact  Kayte Borchard L Wafa Martes, [place] Phone Number: 11/01/2023, 9:50 AM  Clinical Narrative:     Chart review. Patient does not have any bed offers for SNF. He has been declined due to his history of violence/substance abuse. Per chart, patients substance use is recent and ongoing.    Barriers to Discharge: Continued Medical Work up               Expected Discharge Plan and Services       Living arrangements for the past 2 months: Single Family Home Expected Discharge Date: 10/31/23                                     Social Drivers of Health (SDOH) Interventions SDOH Screenings   Food Insecurity: No Food Insecurity (10/24/2023)  Housing: Low Risk  (10/24/2023)  Transportation Needs: No Transportation Needs (10/24/2023)  Utilities: Not At Risk (10/24/2023)  Social Connections: Socially Isolated (10/24/2023)  Tobacco Use: High Risk (10/23/2023)    Readmission Risk Interventions    10/27/2023   11:45 AM  Readmission Risk Prevention Plan  Transportation Screening Complete  PCP or Specialist Appt within 5-7 Days Complete  Medication Review (RN CM) Complete

## 2023-11-01 NOTE — Progress Notes (Signed)
 Daily Progress Note   Patient Name: Parker Brown       Date: 11/01/2023 DOB: [birth year]  Age: 68 y.o. MRN#: [id number] Attending Physician: Josette Ade, MD Primary Care Physician: Pcp, No Admit Date: 10/23/2023  Reason for Consultation/Follow-up: Establishing goals of care  HPI/Brief Hospital Review: 68 y.o. male  with past medical history of COPD, HTN, HLD, current tobacco use, polysubstance abuse (cocaine, marijuana), PTSD, osteoarthritis with chronic low back pain, diverticulosis, carpal tunnel syndrome, and chronic hep C admitted on 10/23/2023 with right sided weakness.   As per chart review, patient experienced right sided weakness resulting in fall while walking to her neighbor's house.  He was found outside lying on the ground by his sister who subsequently called EMS.    Upon presentation to the ED, patient was hypertensive.  Additionally, RN noted patient was obviously intoxicated (Etoh negative) and endorsed striking his head with no loss of consciousness during fall.   Stat noncontrast CT obtained which revealed acute left basal ganglia intraparenchymal hemorrhage with mild edema and minimal mass effect.  Follow-up CTA of head and neck revealed no acute intracranial abnormalities or LVO.   Patient is being treated for intracranial hemorrhage with hemorrhagic stroke, hypertensive emergency, and cocaine abuse.   PMT was consulted to support patient and family with goals of care discussions.  Subjective: Extensive chart review has been completed prior to meeting patient including labs, vital signs, imaging, progress notes, orders, and available advanced directive documents from current and previous encounters.    Visited with Parker Brown at his bedside. He is  awake, alert and able to engage in conversation. Working on bed exercises, appears to have improvement in upper extremity movement. No family or visitors at bedside.  Reviewed Advanced Directive packet, HCPOA and Living Will. Parker Brown again confirms he wishes to appoint his ex wife and daughters as surrogate decision makers. He would like for his ex wife to be included in these conversations. Shared with [name] I attempted to call Parker Brown yesterday unsuccessfully. Parker Brown shares Parker Brown does not have a working phone currently, he would expect her to visit in the next few days. Left AD packet at bedside and PMT contact information. Encouraged Parker Brown to have Parker Brown reach out to PMT provider next time she is visiting bedside.  Parker Brown reports  feeling well today without acute complaints.  PMT will continue to follow for ongoing needs and support.  Objective:  Physical Exam Constitutional:      General: He is not in acute distress. HENT:     Head: Normocephalic.     Mouth/Throat:     Mouth: Mucous membranes are moist.  Pulmonary:     Effort: Pulmonary effort is normal. No respiratory distress.  Skin:    General: Skin is warm and dry.  Neurological:     Mental Status: He is alert and oriented to person, place, and time.     Motor: Weakness present.  Psychiatric:        Mood and Affect: Mood normal.        Behavior: Behavior normal.        Thought Content: Thought content normal.             Vital Signs: BP (!) 137/94 (BP Location: Left Arm)   Pulse 82   Temp 99.3 F (37.4 C)   Resp 20   Ht 5' 5 (1.651 m)   Wt 52.7 kg   SpO2 100%   BMI 19.33 kg/m  SpO2: SpO2: 100 % O2 Device: O2 Device: Room Air O2 Flow Rate:     Palliative Care Assessment & Plan   Assessment/Recommendation/Plan  Completion of AD once Parker Brown available Continue with current plan of care  Thank you for allowing the Palliative Medicine Team to assist in the care of this  patient.  Visit includes: Detailed review of medical records (labs, imaging, vital signs), medically appropriate exam (mental status, respiratory, cardiac, skin), discussed with treatment team, counseling and educating patient, family and staff, documenting clinical information, medication management and coordination of care.  Waddell Lesches, DNP, AGNP-C Palliative Medicine   Please contact Palliative Medicine Team phone at [phone] for questions and concerns.

## 2023-11-02 DIAGNOSIS — R29898 Other symptoms and signs involving the musculoskeletal system: Secondary | ICD-10-CM | POA: Diagnosis not present

## 2023-11-02 DIAGNOSIS — I6389 Other cerebral infarction: Secondary | ICD-10-CM | POA: Diagnosis not present

## 2023-11-02 DIAGNOSIS — G9341 Metabolic encephalopathy: Secondary | ICD-10-CM | POA: Diagnosis not present

## 2023-11-02 DIAGNOSIS — I161 Hypertensive emergency: Secondary | ICD-10-CM | POA: Diagnosis not present

## 2023-11-02 LAB — GLUCOSE, CAPILLARY: Glucose-Capillary: 89 mg/dL (ref 70–99)

## 2023-11-02 MED ORDER — TRAZODONE HCL 50 MG PO TABS
75.0000 mg | ORAL_TABLET | Freq: Every day | ORAL | Status: DC
  Administered 2023-11-02 – 2023-11-09 (×8): 75 mg via ORAL
  Filled 2023-11-02 (×8): qty 2

## 2023-11-02 NOTE — Plan of Care (Signed)
  Problem: Education: Goal: Knowledge of disease or condition will improve Outcome: Progressing Goal: Knowledge of secondary prevention will improve (MUST DOCUMENT ALL) Outcome: Progressing   Problem: Intracerebral Hemorrhage Tissue Perfusion: Goal: Complications of Intracerebral Hemorrhage will be minimized Outcome: Progressing   Problem: Self-Care: Goal: Ability to participate in self-care as condition permits will improve Outcome: Progressing Goal: Verbalization of feelings and concerns over difficulty with self-care will improve Outcome: Progressing Goal: Ability to communicate needs accurately will improve Outcome: Progressing   Problem: Activity: Goal: Risk for activity intolerance will decrease Outcome: Progressing   Problem: Safety: Goal: Ability to remain free from injury will improve Outcome: Progressing

## 2023-11-02 NOTE — Progress Notes (Signed)
 Progress Note   Patient: Parker Brown FMW:[id number] DOB: 07/08/1955 DOA: 10/23/2023     9 DOS: the patient was seen and examined on 11/02/2023   Brief hospital course: Hospital course / significant events:   68 y.o male with significant PMHx of COPD, polysubstance abuse (cocaine, marijuana, current everyday smoker), HTN, HLD, and arthritis who presented to the ED with chief complaints of right-sided weakness and a fall.   HPI: acute onset of right sided weakness which caused him to fall while walking to a neighbor's house. He denied hitting his head, LOC or any other associated symptoms. Patient admitted to smoking and using cocaine prior to presenting to the ED. On EMS arrival patient was noted with blood pressure of 204/104. 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 Headache reported in PM, CT head repeated 10/19: feels more weak in the right side, able to verbalize and follow commands. Repeat CT head ordered given NIH increased to 15. 10/20 persistent R hemiplegia. Weaned off cleviprex and ok to transfer to hospitalist. Plan placement for  inpatient rehab  10/21: EEG done pending read. If no concerns there can dc, if epileptiform can d/w neiro re: Rx but should not hold up dc  10/22.  EEG did not show any seizures but did show cortical dysfunction in the left hemisphere.  Patient had some agitation in the afternoon. 10/23.  Patient was able to move his right leg for me.  Patient has shoulder twitch right side. 10/24.  Patient able to straight leg raise with his right leg and flex in his right knee.  Patient able to lift right arm up off the bed but incoordinated with his hand and shoulder. 10/25.  Patient advised to continue doing exercises in the bed with straight leg raises and  moving his arm. 10/26.  Patient able to extend with his right fingers and make a fist.  Patient able to lift up his right arm but still a little uncoordinated.  Still no movement right ankle and foot but able to flex and extend at the knee and hip. 10/27.  Patient still not able to move right ankle and foot but able to straight leg raise and bend at the knee.  Little bit more movement out of his arm today  Consultants:  PCCU initially admitted to ICU Neurology Palliative care   Procedures/Surgeries: none        Assessment and Plan: * Acute hemorrhagic infarction of brain [facility]) Neurology recommends no antiplatelet agents at this time.  Patient has right-sided weakness.  Patient still with no movement of ankle and right foot.  Able to straight leg raise and hold it there for 10 seconds.  Able to flex and extend at the knee.  Today able to extend his fingers and grip my hand.  Able to push and pull my hand with a bending at the elbow.  Able to hold his arm up off the bed but not able to lift it over his head and keep it there.  Acute metabolic encephalopathy This problem has resolved.  I think Ativan  earlier in the hospitalization made him worse.  No need for meds for agitation at this point.  Patient's mental status is much improved.  Hypertensive emergency Secondary to cocaine.  Blood pressure much improved.  Patient on Norvasc  and Cozaar  Cocaine abuse (HCC) Must stop this.  Alcohol abuse No signs of withdrawal during the hospital course.  I think when they gave him Ativan  it did make him worse.  Ativan  was discontinued        Subjective: Patient states he has been working hard with his right side and trying to get stronger.  Admitted with hemorrhagic stroke.  Physical Exam: Vitals:   11/01/23 1936 11/02/23 0428 11/02/23 0500 11/02/23 0749  BP: (!) 147/96 113/70  130/89  Pulse: 86 97  89  Resp:    18  Temp: 98.1 F (36.7 C) 98.1 F (36.7 C)  98.1 F (36.7 C)   TempSrc:    Oral  SpO2: 100% 100%  100%  Weight:   51.4 kg   Height:       Physical Exam HENT:     Head: Normocephalic.  Eyes:     General: Lids are normal.  Cardiovascular:     Rate and Rhythm: Normal rate and regular rhythm.     Heart sounds: Normal heart sounds, S1 normal and S2 normal.  Pulmonary:     Breath sounds: No decreased breath sounds, wheezing, rhonchi or rales.  Abdominal:     Palpations: Abdomen is soft.     Tenderness: There is no abdominal tenderness.  Musculoskeletal:     Right lower leg: No swelling.     Left lower leg: No swelling.  Skin:    General: Skin is warm.     Findings: No rash.  Neurological:     Mental Status: He is alert.     Comments: Patient unable to move his right foot or toes.  Able to straight leg raise with the right leg and hold it up there for 10 seconds.  Able to flex at the right knee and extend.  Patient still with weak right hand grip but able to grip my hand and extend his fingers.  Able to pull and push with bending at the elbow.  Able to hold right arm up off the bed but not over his head.     Data Reviewed: No new data  Family Communication: Tried to reach family but voicemail box is full  Disposition: Status is: Inpatient Remains inpatient appropriate because: Medically stable to go on to rehab once we obtain a bed  Planned Discharge Destination: Rehab    Time spent: 28 minutes  Author: Charlie Patterson, MD 11/02/2023 12:14 PM  For on call review [email or web address].

## 2023-11-02 NOTE — Progress Notes (Signed)
 Occupational Therapy Treatment Patient Details Name: Parker Brown MRN: [id number] DOB: 07-13-1955 Today's Date: 11/02/2023   History of present illness Patient is a 68 year old male with fall, acute left basal ganglia ICH, hypertensive emergency. Repeat head CT stable. PMH: hypertension and polysubstance abuse   OT comments  Patient seen for OT treatment on this date. Upon arrival to room patient semi fowlers in bed, finishing breakfast, agreeable to treatment. Patient transitioned to EOB (see below for LOA). Focus of session was on improving sitting balance and neuro re-ed thru RUE, patient required cues to complete task and to maintain attention, able to complete 10 reps of cross midline reach with LUE while weight bearing thru RUE forearm with scap stability provided. Patient with 1 posterior LOB while sitting EOB but was able to self correct and bring back to midline. Patient able to don shoes (see below for LOA). Patient performed SPT from EOB to recliner using platform r/w (see below for LOA).  Patient ended treatment in recliner with chair alarm on and all needs within reach. Patient making good progress toward goals, will continue to follow POC. Discharge recommendation remains appropriate.        If plan is discharge home, recommend the following:  Two people to help with walking and/or transfers;Two people to help with bathing/dressing/bathroom;Assistance with cooking/housework;Assistance with feeding;Direct supervision/assist for medications management;Assist for transportation;Direct supervision/assist for financial management;Help with stairs or ramp for entrance;Supervision due to cognitive status   Equipment Recommendations  Other (comment)    Recommendations for Other Services      Precautions / Restrictions Precautions Precautions: Fall Recall of Precautions/Restrictions: Impaired Precaution/Restrictions Comments: SBP< 150 Restrictions Weight Bearing  Restrictions Per Provider Order: No       Mobility Bed Mobility Overal bed mobility: Needs Assistance Bed Mobility: Supine to Sit     Supine to sit: HOB elevated, Used rails, Contact guard          Transfers Overall transfer level: Needs assistance Equipment used: Right platform walker Transfers: Bed to chair/wheelchair/BSC Sit to Stand: Mod assist Stand pivot transfers: Mod assist               Balance Overall balance assessment: Needs assistance Sitting-balance support: Feet supported, No upper extremity supported Sitting balance-Leahy Scale: Fair Sitting balance - Comments: less leaning to the L in sitting. Postural control: Posterior lean, Left lateral lean Standing balance support: Bilateral upper extremity supported, During functional activity, Reliant on assistive device for balance Standing balance-Leahy Scale: Poor Standing balance comment: constant steadying A                           ADL either performed or assessed with clinical judgement   ADL Overall ADL's : Needs assistance/impaired     Grooming: Wash/dry face;Set up               Lower Body Dressing: Moderate assistance Lower Body Dressing Details (indicate cue type and reason): able to don L shoe, A to don R shoe                    Extremity/Trunk Assessment Upper Extremity Assessment Upper Extremity Assessment: RUE deficits/detail RUE Deficits / Details: bicep 2+, low tone   Lower Extremity Assessment Lower Extremity Assessment: Defer to PT evaluation        Vision       Perception     Praxis     Communication Communication Communication: Impaired  Factors Affecting Communication: Difficulty expressing self   Cognition Arousal: Alert Behavior During Therapy: WFL for tasks assessed/performed Cognition: Cognition impaired   Orientation impairments: Situation, Time Awareness: Intellectual awareness intact, Online awareness intact   Attention  impairment (select first level of impairment): Focused attention                     Following commands: Impaired Following commands impaired: Follows one step commands inconsistently      Cueing   Cueing Techniques: Verbal cues, Tactile cues  Exercises      Shoulder Instructions       General Comments      Pertinent Vitals/ Pain       Pain Assessment Pain Assessment: No/denies pain  Home Living                                          Prior Functioning/Environment              Frequency  Min 2X/week        Progress Toward Goals  OT Goals(current goals can now be found in the care plan section)  Progress towards OT goals: Progressing toward goals  Acute Rehab OT Goals Patient Stated Goal: get a new arm. OT Goal Formulation: With patient Time For Goal Achievement: 11/09/23 Potential to Achieve Goals: Fair ADL Goals Pt Will Perform Grooming: with min assist;sitting Pt Will Perform Lower Body Dressing: with min assist;sitting/lateral leans Pt Will Transfer to Toilet: with min assist;stand pivot transfer Pt Will Perform Toileting - Clothing Manipulation and hygiene: with min assist;sit to/from stand  Plan      Co-evaluation                 AM-PAC OT 6 Clicks Daily Activity     Outcome Measure   Help from another person eating meals?: A Lot Help from another person taking care of personal grooming?: A Lot Help from another person toileting, which includes using toliet, bedpan, or urinal?: A Lot Help from another person bathing (including washing, rinsing, drying)?: A Lot Help from another person to put on and taking off regular upper body clothing?: A Lot Help from another person to put on and taking off regular lower body clothing?: Total 6 Click Score: 11    End of Session Equipment Utilized During Treatment: Gait belt  OT Visit Diagnosis: Unsteadiness on feet (R26.81);Repeated falls (R29.6);Muscle weakness  (generalized) (M62.81)   Activity Tolerance Patient tolerated treatment well   Patient Left in chair;with call bell/phone within reach;with chair alarm set;with nursing/sitter in room   Nurse Communication          Time: [phone] OT Time Calculation (min): 20 min  Charges: OT General Charges $OT Visit: 1 Visit OT Treatments $Self Care/Home Management : 8-22 mins  Rogers Brown, OT/L MSOT, 11/02/2023   Parker Brown 11/02/2023, 9:54 AM

## 2023-11-02 NOTE — Plan of Care (Signed)
 Problem: Education: Goal: Knowledge of disease or condition will improve 11/02/2023 0138 by Kennyth Fleming LABOR, RN Outcome: Progressing 11/02/2023 0135 by Kennyth Fleming LABOR, RN Outcome: Progressing Goal: Knowledge of secondary prevention will improve (MUST DOCUMENT ALL) 11/02/2023 0138 by Kennyth Fleming LABOR, RN Outcome: Progressing 11/02/2023 0135 by Kennyth Fleming LABOR, RN Outcome: Progressing Goal: Knowledge of patient specific risk factors will improve (DELETE if not current risk factor) Outcome: Progressing   Problem: Intracerebral Hemorrhage Tissue Perfusion: Goal: Complications of Intracerebral Hemorrhage will be minimized 11/02/2023 0138 by Kennyth Fleming LABOR, RN Outcome: Progressing 11/02/2023 0135 by Kennyth Fleming LABOR, RN Outcome: Progressing   Problem: Coping: Goal: Will verbalize positive feelings about self Outcome: Progressing Goal: Will identify appropriate support needs Outcome: Progressing   Problem: Health Behavior/Discharge Planning: Goal: Ability to manage health-related needs will improve Outcome: Progressing Goal: Goals will be collaboratively established with patient/family Outcome: Progressing   Problem: Self-Care: Goal: Ability to participate in self-care as condition permits will improve 11/02/2023 0138 by Kennyth Fleming LABOR, RN Outcome: Progressing 11/02/2023 0135 by Kennyth Fleming LABOR, RN Outcome: Progressing Goal: Verbalization of feelings and concerns over difficulty with self-care will improve 11/02/2023 0138 by Kennyth Fleming LABOR, RN Outcome: Progressing 11/02/2023 0135 by Kennyth Fleming LABOR, RN Outcome: Progressing Goal: Ability to communicate needs accurately will improve 11/02/2023 0138 by Kennyth Fleming LABOR, RN Outcome: Progressing 11/02/2023 0135 by Kennyth Fleming LABOR, RN Outcome: Progressing   Problem: Nutrition: Goal: Risk of aspiration will decrease Outcome: Progressing Goal: Dietary intake will improve Outcome: Progressing   Problem:  Education: Goal: Knowledge of General Education information will improve Description: Including pain rating scale, medication(s)/side effects and non-pharmacologic comfort measures Outcome: Progressing   Problem: Health Behavior/Discharge Planning: Goal: Ability to manage health-related needs will improve Outcome: Progressing   Problem: Clinical Measurements: Goal: Ability to maintain clinical measurements within normal limits will improve Outcome: Progressing Goal: Will remain free from infection Outcome: Progressing Goal: Diagnostic test results will improve Outcome: Progressing Goal: Respiratory complications will improve Outcome: Progressing Goal: Cardiovascular complication will be avoided Outcome: Progressing   Problem: Activity: Goal: Risk for activity intolerance will decrease 11/02/2023 0138 by Kennyth Fleming LABOR, RN Outcome: Progressing 11/02/2023 0135 by Kennyth Fleming LABOR, RN Outcome: Progressing   Problem: Nutrition: Goal: Adequate nutrition will be maintained Outcome: Progressing   Problem: Coping: Goal: Level of anxiety will decrease Outcome: Progressing   Problem: Elimination: Goal: Will not experience complications related to bowel motility Outcome: Progressing Goal: Will not experience complications related to urinary retention Outcome: Progressing   Problem: Pain Managment: Goal: General experience of comfort will improve and/or be controlled Outcome: Progressing   Problem: Safety: Goal: Ability to remain free from injury will improve 11/02/2023 0138 by Kennyth Fleming LABOR, RN Outcome: Progressing 11/02/2023 0135 by Kennyth Fleming LABOR, RN Outcome: Progressing   Problem: Skin Integrity: Goal: Risk for impaired skin integrity will decrease Outcome: Progressing   Problem: Education: Goal: Knowledge of disease or condition will improve Outcome: Progressing Goal: Knowledge of secondary prevention will improve (MUST DOCUMENT ALL) Outcome:  Progressing Goal: Knowledge of patient specific risk factors will improve (DELETE if not current risk factor) Outcome: Progressing   Problem: Ischemic Stroke/TIA Tissue Perfusion: Goal: Complications of ischemic stroke/TIA will be minimized Outcome: Progressing   Problem: Coping: Goal: Will verbalize positive feelings about self Outcome: Progressing Goal: Will identify appropriate support needs Outcome: Progressing   Problem: Health Behavior/Discharge Planning: Goal: Ability to manage health-related needs will improve Outcome: Progressing Goal: Goals will be collaboratively established with patient/family  Outcome: Progressing   Problem: Self-Care: Goal: Ability to participate in self-care as condition permits will improve Outcome: Progressing Goal: Verbalization of feelings and concerns over difficulty with self-care will improve Outcome: Progressing Goal: Ability to communicate needs accurately will improve Outcome: Progressing   Problem: Nutrition: Goal: Risk of aspiration will decrease Outcome: Progressing Goal: Dietary intake will improve Outcome: Progressing

## 2023-11-03 DIAGNOSIS — G9341 Metabolic encephalopathy: Secondary | ICD-10-CM | POA: Diagnosis not present

## 2023-11-03 DIAGNOSIS — R29898 Other symptoms and signs involving the musculoskeletal system: Secondary | ICD-10-CM | POA: Diagnosis not present

## 2023-11-03 DIAGNOSIS — I161 Hypertensive emergency: Secondary | ICD-10-CM | POA: Diagnosis not present

## 2023-11-03 DIAGNOSIS — I6389 Other cerebral infarction: Secondary | ICD-10-CM | POA: Diagnosis not present

## 2023-11-03 NOTE — Plan of Care (Signed)
  Problem: Ischemic Stroke/TIA Tissue Perfusion: Goal: Complications of ischemic stroke/TIA will be minimized Outcome: Progressing   Problem: Coping: Goal: Will identify appropriate support needs Outcome: Progressing   Problem: Self-Care: Goal: Ability to participate in self-care as condition permits will improve Outcome: Progressing   Problem: Nutrition: Goal: Risk of aspiration will decrease Outcome: Progressing Goal: Dietary intake will improve Outcome: Progressing

## 2023-11-03 NOTE — Plan of Care (Signed)
  Problem: Self-Care: Goal: Ability to participate in self-care as condition permits will improve Outcome: Progressing Note: Able to participate with self. Brushes teeth and shaved independently Goal: Ability to communicate needs accurately will improve Outcome: Progressing Note: Able to make needs known   Problem: Nutrition: Goal: Dietary intake will improve Outcome: Progressing Note: Appetite good   Problem: Clinical Measurements: Goal: Will remain free from infection Outcome: Progressing Note: No signs of infection present

## 2023-11-03 NOTE — Progress Notes (Signed)
 Progress Note   Patient: Parker Brown FMW:[id number] DOB: 1955-10-07 DOA: 10/23/2023     10 DOS: the patient was seen and examined on 11/03/2023   Brief hospital course: Hospital course / significant events:   68 y.o male with significant PMHx of COPD, polysubstance abuse (cocaine, marijuana, current everyday smoker), HTN, HLD, and arthritis who presented to the ED with chief complaints of right-sided weakness and a fall.   HPI: acute onset of right sided weakness which caused him to fall while walking to a neighbor's house. He denied hitting his head, LOC or any other associated symptoms. Patient admitted to smoking and using cocaine prior to presenting to the ED. On EMS arrival patient was noted with blood pressure of 204/104. 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 Headache reported in PM, CT head repeated 10/19: feels more weak in the right side, able to verbalize and follow commands. Repeat CT head ordered given NIH increased to 15. 10/20 persistent R hemiplegia. Weaned off cleviprex and ok to transfer to hospitalist. Plan placement for  inpatient rehab  10/21: EEG done pending read. If no concerns there can dc, if epileptiform can d/w neiro re: Rx but should not hold up dc  10/22.  EEG did not show any seizures but did show cortical dysfunction in the left hemisphere.  Patient had some agitation in the afternoon. 10/23.  Patient was able to move his right leg for me.  Patient has shoulder twitch right side. 10/24.  Patient able to straight leg raise with his right leg and flex in his right knee.  Patient able to lift right arm up off the bed but incoordinated with his hand and shoulder. 10/25.  Patient advised to continue doing exercises in the bed with straight leg raises and  moving his arm. 10/26.  Patient able to extend with his right fingers and make a fist.  Patient able to lift up his right arm but still a little uncoordinated.  Still no movement right ankle and foot but able to flex and extend at the knee and hip. 10/27.  Patient still not able to move right ankle and foot but able to straight leg raise and bend at the knee.  Little bit more movement out of his arm today 10/28.  Patient stated he walked with physical therapy today.  Consultants:  PCCU initially admitted to ICU Neurology Palliative care   Procedures/Surgeries: none        Assessment and Plan: * Acute hemorrhagic infarction of brain [facility]) Neurology recommends no antiplatelet agents at this time.  Patient has right-sided weakness.  Patient's right side has improved during the hospital course.  Patient still with no movement of ankle and right foot.  Patient able to straight leg raise and flex at the knee.  Able to extend fingers very slowly and grip very slowly.  Able to bend at the elbow flex and extend.  Incoordination with the right shoulder.  Acute metabolic encephalopathy This problem has resolved.  I think Ativan  earlier in the hospitalization made him worse.  No need for meds for agitation at this point.  Patient's mental status is much improved.  Hypertensive emergency Secondary to cocaine.  Blood pressure much improved.  Patient on Norvasc  and Cozaar  Cocaine abuse (HCC) Must stop this.  Alcohol abuse No signs  of withdrawal during the hospital course.  I think when they gave him Ativan  it did make him worse.  Ativan  was discontinued        Subjective: Patient states he is working hard and moving his right side.  Still not able to move his right ankle.  Physical Exam: Vitals:   11/02/23 1925 11/03/23 0419 11/03/23 0500 11/03/23 0743  BP: (!) 141/83 116/63  (!) 135/95  Pulse: 94 89  85  Resp:    18  Temp: 98.4 F (36.9 C) 98.2 F (36.8 C)  97.9 F (36.6 C)   TempSrc:      SpO2: 100% 100%  100%  Weight:   51.1 kg   Height:       Physical Exam HENT:     Head: Normocephalic.  Eyes:     General: Lids are normal.  Cardiovascular:     Rate and Rhythm: Normal rate and regular rhythm.     Heart sounds: Normal heart sounds, S1 normal and S2 normal.  Pulmonary:     Breath sounds: No decreased breath sounds, wheezing, rhonchi or rales.  Abdominal:     Palpations: Abdomen is soft.     Tenderness: There is no abdominal tenderness.  Musculoskeletal:     Right lower leg: No swelling.     Left lower leg: No swelling.  Skin:    General: Skin is warm.     Findings: No rash.  Neurological:     Mental Status: He is alert.     Comments: Patient unable to move his right foot or toes.  Able to straight leg raise with the right leg and flex with the knee.  Still has some weak grip strength and weak with extension of the fingers.  Incoordination of the right arm.  Able to bend at the elbow.     Data Reviewed: No new data  Family Communication: mailbox full on contact  Disposition: Status is: Inpatient Remains inpatient appropriate because: Continue working with physical therapy until strong enough to get out of the hospital versus finding a rehab bed  Planned Discharge Destination: Physical therapy recommending rehab    Time spent: 28 minutes  Author: Charlie Patterson, MD 11/03/2023 2:14 PM  For on call review [email or web address].

## 2023-11-03 NOTE — Progress Notes (Signed)
 Occupational Therapy Treatment Patient Details Name: Parker Brown MRN: [id number] DOB: [birth year] Today's Date: 11/03/2023   History of present illness Patient is a 68 year old male with fall, acute left basal ganglia ICH, hypertensive emergency. Repeat head CT stable. PMH: hypertension and polysubstance abuse   OT comments  Patient seen for OT/PT treatment on this date. Upon arrival to room patient sitting EOB with PT, prepping for gait training. Patient engaged in 3 trials of gait training with varying levels of A, prominently required tactile cues and assist to shift weight into LLE for advancing RLE with PT facilitating advancement of RLE and blocking R knee while advancing LLE. Cues needed throughout for UB posture/positioning with short term carryover. Patient receptive to cues to use LUE to push up/lower down for sit<>stands from recliner.  Once back in room, patient engaged in neuro reed for RUE with weight bearing/crossing midline, AAROM/AROM scapular abduction/adduction, elbow flexion/extension and scapular intervention rotation. OT provided patient with built up grip to address digit flexion and patient with return demo, OT positioned pillow underneath RUE for proper GH alignment. Patient ended treatment in recliner with chair alarm on and all needs within reach. Patient making good progress toward goals, will continue to follow POC. Discharge recommendation remains appropriate.        If plan is discharge home, recommend the following:  Two people to help with walking and/or transfers;Two people to help with bathing/dressing/bathroom;Assistance with cooking/housework;Assistance with feeding;Direct supervision/assist for medications management;Assist for transportation;Direct supervision/assist for financial management;Help with stairs or ramp for entrance;Supervision due to cognitive status   Equipment Recommendations  Other (comment)    Recommendations for Other Services       Precautions / Restrictions Precautions Precautions: Fall Recall of Precautions/Restrictions: Impaired Precaution/Restrictions Comments: SBP< 150 Restrictions Weight Bearing Restrictions Per Provider Order: No       Mobility Bed Mobility                    Transfers Overall transfer level: Needs assistance Equipment used: Right platform walker Transfers: Sit to/from Stand Sit to Stand: Min assist           General transfer comment: cues to use LUE to push up from seated surface     Balance Overall balance assessment: Needs assistance Sitting-balance support: Feet supported, No upper extremity supported Sitting balance-Leahy Scale: Fair Sitting balance - Comments: less leaning to the L in sitting. Postural control: Posterior lean, Left lateral lean Standing balance support: Bilateral upper extremity supported, During functional activity, Reliant on assistive device for balance Standing balance-Leahy Scale: Poor Standing balance comment: constant steadying A                           ADL either performed or assessed with clinical judgement   ADL                                              Extremity/Trunk Assessment Upper Extremity Assessment Upper Extremity Assessment: RUE deficits/detail RUE Deficits / Details: improvement in tricep activiation            Vision       Perception     Praxis     Communication Communication Communication: Impaired Factors Affecting Communication: Difficulty expressing self   Cognition Arousal: Alert Behavior During Therapy: WFL for tasks assessed/performed Cognition:  Cognition impaired     Awareness: Intellectual awareness intact, Online awareness impaired                         Following commands: Impaired Following commands impaired: Follows one step commands with increased time      Cueing   Cueing Techniques: Verbal cues, Tactile cues  Exercises  Exercises: Other exercises Other Exercises Other Exercises: weight bearing thru RUE Other Exercises: min resistance x 10 reps of bicep flexion, tricep extension, scapular adduction, interal rotation Other Exercises: PROM scapular abduction    Shoulder Instructions       General Comments      Pertinent Vitals/ Pain       Pain Assessment Pain Assessment: No/denies pain  Home Living                                          Prior Functioning/Environment              Frequency  Min 2X/week        Progress Toward Goals  OT Goals(current goals can now be found in the care plan section)  Progress towards OT goals: Progressing toward goals  Acute Rehab OT Goals Patient Stated Goal: make my hand work OT Goal Formulation: With patient Time For Goal Achievement: 11/09/23 Potential to Achieve Goals: Fair ADL Goals Pt Will Perform Grooming: with min assist;sitting Pt Will Perform Lower Body Dressing: with min assist;sitting/lateral leans Pt Will Transfer to Toilet: with min assist;stand pivot transfer Pt Will Perform Toileting - Clothing Manipulation and hygiene: with min assist;sit to/from stand  Plan      Co-evaluation    PT/OT/SLP Co-Evaluation/Treatment: Yes Reason for Co-Treatment: Complexity of the patient's impairments (multi-system involvement) PT goals addressed during session: Mobility/safety with mobility;Balance;Proper use of DME;Strengthening/ROM OT goals addressed during session: ADL's and self-care;Proper use of Adaptive equipment and DME      AM-PAC OT 6 Clicks Daily Activity     Outcome Measure   Help from another person eating meals?: A Lot Help from another person taking care of personal grooming?: A Lot Help from another person toileting, which includes using toliet, bedpan, or urinal?: A Lot Help from another person bathing (including washing, rinsing, drying)?: A Lot Help from another person to put on and taking off  regular upper body clothing?: A Lot Help from another person to put on and taking off regular lower body clothing?: Total 6 Click Score: 11    End of Session Equipment Utilized During Treatment: Gait belt  OT Visit Diagnosis: Unsteadiness on feet (R26.81);Repeated falls (R29.6);Muscle weakness (generalized) (M62.81)   Activity Tolerance Patient tolerated treatment well   Patient Left in chair;with call bell/phone within reach;with chair alarm set;with nursing/sitter in room   Nurse Communication          Time: 8997-8974 OT Time Calculation (min): 23 min  Charges: OT General Charges $OT Visit: 1 Visit OT Treatments $Neuromuscular Re-education: 23-37 mins  Rogers Clause, OT/L MSOT, 11/03/2023

## 2023-11-03 NOTE — Progress Notes (Signed)
 Pt received in bed agreeable to PT/OT co-tx to safely progress and provide manual assist needed during mobility. Pt able to transfer sit to stand if cued to push off with L UE requiring MinA. Once standing at [employer], assist given for UE placement. MaxA of 2 to complete gait training 15'x 2. Heavy assist needed for wt shifting and management of R LE during stance and swing through phase. Pt has difficulty wt shifting to R. Will attempt gait with regular RW next visit.  11/03/23 1020  PT Visit Information  Assistance Needed +2  Reason for Co-Treatment Complexity of the patient's impairments (multi-system involvement)  PT goals addressed during session Mobility/safety with mobility;Balance;Proper use of DME;Strengthening/ROM  OT goals addressed during session ADL's and self-care;Proper use of Adaptive equipment and DME  History of Present Illness Patient is a 68 year old male with fall, acute left basal ganglia ICH, hypertensive emergency. Repeat head CT stable. PMH: hypertension and polysubstance abuse  Subjective Data  Patient Stated Goal none stated  Precautions  Precautions Fall  Recall of Precautions/Restrictions Impaired  Restrictions  Weight Bearing Restrictions Per Provider Order No  Pain Assessment  Pain Assessment No/denies pain  Cognition  Arousal Alert  Behavior During Therapy WFL for tasks assessed/performed  PT - Cognitive impairments Safety/Judgement;Problem solving;Awareness  Following Commands  Following commands Impaired  Following commands impaired Follows one step commands inconsistently  Cueing  Cueing Techniques Verbal cues;Tactile cues  Communication  Communication Impaired  Factors Affecting Communication Difficulty expressing self  Bed Mobility  Overal bed mobility Needs Assistance  Bed Mobility Supine to Sit  Supine to sit Contact guard;HOB elevated;Used rails  General bed mobility comments  (Cues for technique and spacial awareness of R UE)  Transfers   Overall transfer level Needs assistance  Equipment used Right platform walker  Transfers Sit to/from Stand  Sit to Stand Min assist  General transfer comment  (Able to raise to standing with cues to push off with L UE)  Ambulation/Gait  Ambulation/Gait assistance Mod assist;+2 physical assistance (+3 for Chair follow)  Gait Distance (Feet)  (15)  Assistive device Right platform walker  Gait Pattern/deviations Step-to pattern;Decreased step length - right;Knees buckling;Decreased weight shift to left;Decreased stride length;Ataxic;Narrow base of support  General Gait Details  (Ace wrap to R LE to simulate AFO and improve heel strike during gait. Increased assist to encourage wt shift to Left and manually assist R LE forward and block knee in stance phase to prevent buckling)  Gait velocity decr  Balance  Overall balance assessment Needs assistance  Sitting-balance support Feet supported;No upper extremity supported  Sitting balance-Leahy Scale Fair  Standing balance support Bilateral upper extremity supported;During functional activity;Reliant on assistive device for balance  Standing balance-Leahy Scale Poor  Standing balance comment constant steadying A  Exercises  Exercises Other exercises  Other Exercises  Other Exercises  (Pt educated verbally and through demonstration regarding safe gait technique and wt shifting in order to facilitate R LE swing through with fair carry over)  PT - End of Session  Equipment Utilized During Treatment Gait belt  Activity Tolerance Patient tolerated treatment well  Patient left in chair;with call bell/phone within reach;Other (comment) (With OT)  Nurse Communication Mobility status   PT - Assessment/Plan  PT Visit Diagnosis Hemiplegia and hemiparesis;Other abnormalities of gait and mobility (R26.89)  Hemiplegia - Right/Left Right  Hemiplegia - dominant/non-dominant Dominant  Hemiplegia - caused by Nontraumatic intracerebral hemorrhage  PT  Frequency (ACUTE ONLY) Min 3X/week  Recommendations  for Other Services Rehab consult  Follow Up Recommendations Acute inpatient rehab (3hours/day)  Can patient physically be transported by private vehicle No  Patient can return home with the following Two people to help with walking and/or transfers;Two people to help with bathing/dressing/bathroom;Assistance with cooking/housework;Direct supervision/assist for medications management;Direct supervision/assist for financial management;Assist for transportation;Help with stairs or ramp for entrance;Supervision due to cognitive status  PT equipment Other (comment) (TBD)  AM-PAC PT 6 Clicks Mobility Outcome Measure (Version 2)  Help needed turning from your back to your side while in a flat bed without using bedrails? 2  Help needed moving from lying on your back to sitting on the side of a flat bed without using bedrails? 2  Help needed moving to and from a bed to a chair (including a wheelchair)? 2  Help needed standing up from a chair using your arms (e.g., wheelchair or bedside chair)? 2  Help needed to walk in hospital room? 2  Help needed climbing 3-5 steps with a railing?  1  6 Click Score 11  Consider Recommendation of Discharge To: CIR/SNF/LTACH  Progressive Mobility  What is the highest level of mobility based on the mobility assessment? Level 3 (Stands with assistance) - Balance while standing  and cannot march in place  Activity Stood at bedside;Ambulated with assistance  PT Goal Progression  Progress towards PT goals Progressing toward goals  PT Time Calculation  PT Start Time (ACUTE ONLY) 1000  PT Stop Time (ACUTE ONLY) 1020  PT Time Calculation (min) (ACUTE ONLY) 20 min  PT General Charges  $$ ACUTE PT VISIT 1 Visit  PT Treatments  $Therapeutic Activity 8-22 mins  Darice Bohr, PTA

## 2023-11-04 DIAGNOSIS — R29898 Other symptoms and signs involving the musculoskeletal system: Secondary | ICD-10-CM | POA: Diagnosis not present

## 2023-11-04 DIAGNOSIS — I6389 Other cerebral infarction: Secondary | ICD-10-CM | POA: Diagnosis not present

## 2023-11-04 DIAGNOSIS — I619 Nontraumatic intracerebral hemorrhage, unspecified: Secondary | ICD-10-CM | POA: Diagnosis not present

## 2023-11-04 DIAGNOSIS — Z515 Encounter for palliative care: Secondary | ICD-10-CM | POA: Diagnosis not present

## 2023-11-04 LAB — GLUCOSE, CAPILLARY: Glucose-Capillary: 102 mg/dL — ABNORMAL HIGH (ref 70–99)

## 2023-11-04 LAB — BASIC METABOLIC PANEL WITH GFR
Anion gap: 13 (ref 5–15)
BUN: 26 mg/dL — ABNORMAL HIGH (ref 8–23)
CO2: 23 mmol/L (ref 22–32)
Calcium: 9.1 mg/dL (ref 8.9–10.3)
Chloride: 102 mmol/L (ref 98–111)
Creatinine, Ser: 1.12 mg/dL (ref 0.61–1.24)
GFR, Estimated: >60 mL/min (ref >60)
Glucose, Bld: 96 mg/dL (ref 70–99)
Potassium: 4.1 mmol/L (ref 3.5–5.1)
Sodium: 138 mmol/L (ref 135–145)

## 2023-11-04 LAB — CBC
HCT: 37.9 % — ABNORMAL LOW (ref 39.0–52.0)
Hemoglobin: 12.3 g/dL — ABNORMAL LOW (ref 13.0–17.0)
MCH: 29.4 pg (ref 26.0–34.0)
MCHC: 32.5 g/dL (ref 30.0–36.0)
MCV: 90.7 fL (ref 80.0–100.0)
Platelets: 301 K/uL (ref 150–400)
RBC: 4.18 MIL/uL — ABNORMAL LOW (ref 4.22–5.81)
RDW: 14.1 % (ref 11.5–15.5)
WBC: 4.6 K/uL (ref 4.0–10.5)
nRBC: 0 % (ref 0.0–0.2)

## 2023-11-04 NOTE — Progress Notes (Signed)
 Palliative Care Progress Note, Assessment & Plan   Patient Name: Parker Brown       Date: 11/04/2023 DOB: [birth year]  Age: 68 y.o. MRN#: [id number] Attending Physician: Jhonny Calvin NOVAK, MD Primary Care Physician: Pcp, No Admit Date: 10/23/2023  Subjective: Patient is out of bed and sitting in recliner eating his lunch.  He is awake, alert, and oriented x 4.  He acknowledges my presence and is able to make his wishes known.  No family or friends present at bedside during my visit.  HPI: 68 y.o. male  with past medical history of COPD, HTN, HLD, current tobacco use, polysubstance abuse (cocaine, marijuana), PTSD, osteoarthritis with chronic low back pain, diverticulosis, carpal tunnel syndrome, and chronic hep C admitted on 10/23/2023 with right sided weakness.   As per chart review, patient experienced right sided weakness resulting in fall while walking to his neighbor's house.  He was found outside lying on the ground by his sister who subsequently called EMS.    Upon presentation to the ED, patient was hypertensive.  Additionally, RN noted patient was obviously intoxicated (Etoh negative) and endorsed striking his head with no loss of consciousness during fall.   Stat noncontrast CT obtained which revealed acute left basal ganglia intraparenchymal hemorrhage with mild edema and minimal mass effect.  Follow-up CTA of head and neck revealed no acute intracranial abnormalities or LVO.   Patient is being treated for intracranial hemorrhage with hemorrhagic stroke, hypertensive emergency, and cocaine abuse.   PMT was consulted to support patient and family with goals of care discussions.  Summary of counseling/coordination of care: Extensive chart review completed prior to meeting patient  including labs, vital signs, imaging, progress notes, orders, and available advanced directive documents from current and previous encounters.   After reviewing the patient's chart and assessing the patient at bedside, I spoke with patient in regards to symptom management and goals of care.   Symptoms assessed.  Patient shares he is in no pain.  He is happy that he has some additional movement in his right arm and right leg.  He shares he continues to work with therapies to get better.  He has no acute physical complaints at this time.  No adjustment to floor if needed.  I discussed next steps as well as next of kin decision maker with patient.  He points to the advance directive paperwork at bedside and shares she is ready to name his ex-wife is the person in charge.  Patient endorses understanding that in the event he is unable to speak for himself that he would like for his ex-wife Parker Brown to be his first next of kin decision maker and then his daughter is his second line.  Education provided on completion of advance directives.  Specifically, discussed that spiritual care team will facilitate witnesses and notary in order to complete paperwork.  Reviewed that it is on their timeline and coordinating volunteers with a notary cannot be done quickly.  Advised patient that I will reach out to spiritual care and asked them to meet with him again to complete paperwork.  He was appreciative of this discussion.  He had no further concerns at this time.  Patient  continues to endorse wanting anything and everything done to keep him alive.  Full code and full scope remain.  PMT will continue to follow and support.  No adjustment to plan of care at this time.    Physical Exam Vitals reviewed.  Constitutional:      General: He is not in acute distress.    Appearance: He is normal weight. He is not ill-appearing.  HENT:     Head: Normocephalic.     Mouth/Throat:     Mouth: Mucous membranes are moist.   Eyes:     Pupils: Pupils are equal, round, and reactive to light.  Pulmonary:     Effort: Pulmonary effort is normal.  Abdominal:     Palpations: Abdomen is soft.  Skin:    General: Skin is warm and dry.  Neurological:     Mental Status: He is alert and oriented to person, place, and time.  Psychiatric:        Behavior: Behavior normal.             35 minute visit includes: Detailed review of medical records (labs, imaging, vital signs), medically appropriate exam (mental status, respiratory, cardiac, skin), discussed with treatment team, counseling and educating patient, family and staff, documenting clinical information, medication management and coordination of care.  Parker L. Arvid, DNP, FNP-BC Palliative Medicine Team

## 2023-11-04 NOTE — Plan of Care (Signed)
  Problem: Education: Goal: Knowledge of patient specific risk factors will improve (DELETE if not current risk factor) Outcome: Progressing   Problem: Coping: Goal: Will verbalize positive feelings about self Outcome: Progressing   Problem: Health Behavior/Discharge Planning: Goal: Ability to manage health-related needs will improve Outcome: Progressing   Problem: Self-Care: Goal: Ability to communicate needs accurately will improve Outcome: Progressing

## 2023-11-04 NOTE — Progress Notes (Signed)
 PROGRESS NOTE    Parker Brown  FMW:[id number] DOB: 10-Jan-1955 DOA: 10/23/2023 PCP: Pcp, No    Brief Narrative:  68 y.o male with significant PMHx of COPD, polysubstance abuse (cocaine, marijuana, current everyday smoker), HTN, HLD, and arthritis who presented to the ED with chief complaints of right-sided weakness and a fall.   HPI: acute onset of right sided weakness which caused him to fall while walking to a neighbor's house. He denied hitting his head, LOC or any other associated symptoms. Patient admitted to smoking and using cocaine prior to presenting to the ED. On EMS arrival patient was noted with blood pressure of 204/104. 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 Headache reported in PM, CT head repeated 10/19: feels more weak in the right side, able to verbalize and follow commands. Repeat CT head ordered given NIH increased to 15. 10/20 persistent R hemiplegia. Weaned off cleviprex and ok to transfer to hospitalist. Plan placement for  inpatient rehab  10/21: EEG done pending read. If no concerns there can dc, if epileptiform can d/w neiro re: Rx but should not hold up dc  10/22.  EEG did not show any seizures but did show cortical dysfunction in the left hemisphere.  Patient had some agitation in the afternoon. 10/23.  Patient was able to move his right leg for me.  Patient has shoulder twitch right side. 10/24.  Patient able to straight leg raise with his right leg and flex in his right knee.  Patient able to lift right arm up off the bed but incoordinated with his hand and shoulder. 10/25.  Patient advised to continue doing exercises in the bed with straight leg raises and moving his arm. 10/26.  Patient able to extend with his right fingers and make a fist.  Patient  able to lift up his right arm but still a little uncoordinated.  Still no movement right ankle and foot but able to flex and extend at the knee and hip. 10/27.  Patient still not able to move right ankle and foot but able to straight leg raise and bend at the knee.  Little bit more movement out of his arm today 10/28.  Patient stated he walked with physical therapy today.   Assessment & Plan:   Principal Problem:   Acute hemorrhagic infarction of brain [facility]) Active Problems:   Acute metabolic encephalopathy   Hypertensive emergency   Cocaine abuse (HCC)   Alcohol abuse   Intraparenchymal hemorrhage of brain (HCC)   Paralysis (HCC)   Weakness of right lower extremity   Weakness of right upper extremity  * Acute hemorrhagic infarction of brain [facility]) Neurology recommends no antiplatelet agents at this time.  Patient has right-sided weakness.  Patient's right side has improved during the hospital course.  Patient still with no movement of ankle and right foot.  Patient able to straight leg raise and flex at the knee.  Able to extend fingers very slowly and grip very slowly.  Able to bend at the elbow flex and extend.  Incoordination with the right shoulder.  Would benefit from continued treatment at a skilled nursing facility versus acute inpatient rehab as next level of care.  The patient is medically stable and motivated for improvement.  It is the recommendation of the inpatient care team that this  patient dispo to acute inpatient rehab versus skilled nursing facility as a next level of care.   Acute metabolic encephalopathy Resolved.  Mental status improved.  Not agitated.  Adherent with recommended therapy   Hypertensive emergency Secondary to cocaine use.  Blood pressure improved.  Continue Norvasc  and Cozaar   Cocaine abuse (HCC) Advised on cessation   Alcohol abuse No evidence of withdrawal     DVT prophylaxis: SCD Code Status: Full Family Communication: None Disposition  Plan: Status is: Inpatient Remains inpatient appropriate because: Unsafe discharge plan.  Need skilled nursing facility.   Level of care: Telemetry Medical  Consultants:  None  Procedures:  None  Antimicrobials: None   Subjective: Seen and examined.  Sitting up in chair.  No visible distress.  Answers questions appropriately.  Objective: Vitals:   11/03/23 0743 11/03/23 1922 11/04/23 0500 11/04/23 0504  BP: (!) 135/95 118/88  (!) 116/91  Pulse: 85 82  77  Resp: 18 17  16   Temp: 97.9 F (36.6 C) 97.7 F (36.5 C)  98.6 F (37 C)  TempSrc:  Oral  Oral  SpO2: 100% 100%  100%  Weight:   56.5 kg   Height:        Intake/Output Summary (Last 24 hours) at 11/04/2023 1323 Last data filed at 11/04/2023 1001 Gross per 24 hour  Intake 600 ml  Output --  Net 600 ml   Filed Weights   11/02/23 0500 11/03/23 0500 11/04/23 0500  Weight: 51.4 kg 51.1 kg 56.5 kg    Examination:  General exam: Appears calm and comfortable  Respiratory system: Clear to auscultation. Respiratory effort normal. Cardiovascular system: S1 S2, RRR, no murmurs, no pedal edema Gastrointestinal system: Thin, soft, NT/ND, normal bowel sounds Central nervous system: Alert.  Oriented x 3.  Decreased range of motion right lower extremity.  Weak grip strength on right Extremities: Symmetric 5 x 5 power. Skin: No rashes, lesions or ulcers Psychiatry: Judgement and insight appear normal. Mood & affect appropriate.     Data Reviewed: I have personally reviewed following labs and imaging studies  CBC: Recent Labs  Lab 10/31/23 0602 11/04/23 0436  WBC 4.3 4.6  HGB 12.4* 12.3*  HCT 37.9* 37.9*  MCV 90.2 90.7  PLT 245 301   Basic Metabolic Panel: Recent Labs  Lab 10/31/23 0602 11/04/23 0436  NA 136 138  K 4.1 4.1  CL 103 102  CO2 23 23  GLUCOSE 105* 96  BUN 26* 26*  CREATININE 1.19 1.12  CALCIUM 8.6* 9.1   GFR: Estimated Creatinine Clearance: 49 mL/min (by C-G formula based on SCr of  1.12 mg/dL). Liver Function Tests: No results for input(s): AST, ALT, ALKPHOS, BILITOT, PROT, ALBUMIN in the last 168 hours. No results for input(s): LIPASE, AMYLASE in the last 168 hours. No results for input(s): AMMONIA in the last 168 hours. Coagulation Profile: No results for input(s): INR, PROTIME in the last 168 hours. Cardiac Enzymes: No results for input(s): CKTOTAL, CKMB, CKMBINDEX, TROPONINI in the last 168 hours. BNP (last 3 results) No results for input(s): PROBNP in the last 8760 hours. HbA1C: No results for input(s): HGBA1C in the last 72 hours. CBG: Recent Labs  Lab 10/31/23 1751 11/01/23 0759 11/01/23 1210 11/01/23 1637 11/02/23 0753  GLUCAP 98 109* 109* 128* 89   Lipid Profile: No results for input(s): CHOL, HDL, LDLCALC, TRIG, CHOLHDL, LDLDIRECT in the last 72 hours. Thyroid Function Tests: No results for input(s): TSH, T4TOTAL, FREET4, T3FREE, THYROIDAB in the last 72 hours.  Anemia Panel: No results for input(s): VITAMINB12, FOLATE, FERRITIN, TIBC, IRON, RETICCTPCT in the last 72 hours. Sepsis Labs: No results for input(s): PROCALCITON, LATICACIDVEN in the last 168 hours.  No results found for this or any previous visit (from the past 240 hours).       Radiology Studies: No results found.      Scheduled Meds:  amLODipine   10 mg Oral Daily   folic acid  1 mg Oral Daily   losartan  100 mg Oral Daily   multivitamin with minerals  1 tablet Oral Daily   thiamine  100 mg Oral Daily   Or   thiamine  100 mg Intravenous Daily   traZODone   75 mg Oral QHS   Continuous Infusions:   LOS: 11 days       Calvin KATHEE Robson, MD [facility]   If 7PM-7AM, please contact night-coverage  11/04/2023, 1:23 PM

## 2023-11-04 NOTE — Plan of Care (Signed)
  Problem: Intracerebral Hemorrhage Tissue Perfusion: Goal: Complications of Intracerebral Hemorrhage will be minimized Outcome: Progressing Note: No complications noted.   Problem: Self-Care: Goal: Ability to participate in self-care as condition permits will improve Outcome: Progressing Note: Participates in care. Shaves self, brushes teeth. Transfers from bed or chair to [facility] with assist of 2 people. Goal: Ability to communicate needs accurately will improve Outcome: Progressing Note: Speech is clear. Able to make needs known   Problem: Nutrition: Goal: Risk of aspiration will decrease Outcome: Progressing Note: No signs or symptoms of aspiration and no difficulty swallowing Goal: Dietary intake will improve Outcome: Progressing Note: Able to feed self. Appetite good.   Problem: Clinical Measurements: Goal: Will remain free from infection Outcome: Progressing Note: No signs and symptoms of infection Goal: Respiratory complications will improve Outcome: Progressing Note: No respiratory issues noted. On RA.   Problem: Activity: Goal: Risk for activity intolerance will decrease Outcome: Progressing   Problem: Elimination: Goal: Will not experience complications related to bowel motility Outcome: Progressing Note: Bowel movement today Goal: Will not experience complications related to urinary retention Outcome: Progressing Note: Urinal at bedside. No signs of urinary retention   Problem: Pain Managment: Goal: General experience of comfort will improve and/or be controlled Outcome: Progressing Note: No complaints of pain   Problem: Skin Integrity: Goal: Risk for impaired skin integrity will decrease Outcome: Progressing Note: Skin intact   Problem: Safety: Goal: Ability to remain free from injury will improve Note: Has remained free from injury

## 2023-11-04 NOTE — TOC Progression Note (Addendum)
 Transition of Care [facility]) - Progression Note    Patient Details  Name: Parker Brown MRN: [id number] Date of Birth: 1955/02/05  [facility]) CM/SW Contact  Marinda Cooks, RN Phone Number: 11/04/2023, 1:11 PM  Clinical Narrative:    Per Chart review t does not have any bed offers . THIS CM called and spoke with admission liaison at [facility] per pending status and was informed that there is currently on male beds available today and his referral will cont to be considered when a male bed opens up. This CM also called and spoke with admission liaison [place] at [facility] to discuss pending bed ofer and was informed they have to decline b/c of pt's (+) UDS. TOC will cont to follow dc planning / care coordination and update as applicable.      Barriers to Discharge: Continued Medical Work up No bed offers pt has complex PMH including (+) UDS for cocaine prior to this admission     Discharge Plan and Services    SNF per PT recommendations    Living arrangements for the past 2 months: Single Family Home Expected Discharge Date: 10/31/23                                     Social Drivers of Health (SDOH) Interventions SDOH Screenings   Food Insecurity: No Food Insecurity (10/24/2023)  Housing: Low Risk  (10/24/2023)  Transportation Needs: No Transportation Needs (10/24/2023)  Utilities: Not At Risk (10/24/2023)  Social Connections: Socially Isolated (10/24/2023)  Tobacco Use: High Risk (10/23/2023)    Readmission Risk Interventions    10/27/2023   11:45 AM  Readmission Risk Prevention Plan  Transportation Screening Complete  PCP or Specialist Appt within 5-7 Days Complete  Medication Review (RN CM) Complete

## 2023-11-04 NOTE — Progress Notes (Signed)
 Occupational Therapy Treatment Patient Details Name: Parker Brown MRN: [id number] DOB: 12-Mar-1955 Today's Date: 11/04/2023   History of present illness Patient is a 68 year old male with fall, acute left basal ganglia ICH, hypertensive emergency. Repeat head CT stable. PMH: hypertension and polysubstance abuse   OT comments  Patient seen for OT treatment on this date. Upon arrival to room patient semi fowlers in bed, agreeable to treatment. Focus of session was on neuro re-ed for RUE, see below for PROM. Patient able to engage in all planes with 3/5 noted in pronation/supination, elbow flexion; 2/5 for wrist extension and digit flexion. OT instructed patient how to perform AAROM shoulder flexion and elbow flexion in bed, patient able to return demo. Patient making excellent progress towards goals.  Patient ended treatment in bed with bed alarm on and all needs within reach. Discharge recommendation remains appropriate.        If plan is discharge home, recommend the following:  Two people to help with walking and/or transfers;Two people to help with bathing/dressing/bathroom;Assistance with cooking/housework;Assistance with feeding;Direct supervision/assist for medications management;Assist for transportation;Direct supervision/assist for financial management;Help with stairs or ramp for entrance;Supervision due to cognitive status   Equipment Recommendations  Other (comment) (defer to next level of care)    Recommendations for Other Services Rehab consult    Precautions / Restrictions Precautions Precautions: Fall Recall of Precautions/Restrictions: Impaired Restrictions Weight Bearing Restrictions Per Provider Order: No       Mobility Bed Mobility Overal bed mobility: Needs Assistance Bed Mobility: Supine to Sit     Supine to sit: Contact guard, HOB elevated, Used rails Sit to supine: Contact guard assist   General bed mobility comments: cues for body mechanics  to self facilitate R UE/LE movement    Transfers                         Balance Overall balance assessment: Needs assistance Sitting-balance support: Feet supported, No upper extremity supported Sitting balance-Leahy Scale: Good                                     ADL either performed or assessed with clinical judgement   ADL                                              Extremity/Trunk Assessment              Vision       Perception     Praxis     Communication Communication Communication: Impaired Factors Affecting Communication: Difficulty expressing self   Cognition Arousal: Alert Behavior During Therapy: WFL for tasks assessed/performed Cognition: Cognition impaired     Awareness: Intellectual awareness intact, Online awareness impaired                         Following commands: Impaired Following commands impaired: Follows one step commands inconsistently      Cueing   Cueing Techniques: Verbal cues, Tactile cues  Exercises Exercises: Other exercises Other Exercises Other Exercises: PROM 10 reps 1 set: abduction/adduction, ER/IR, elbow flexion/extension, pronation/supination, wrist flexion/extension, digit flexion/extension and thumb abduction/adduction.    Shoulder Instructions       General Comments  Pertinent Vitals/ Pain       Pain Assessment Pain Assessment: No/denies pain Pain Score: 0-No pain  Home Living                                          Prior Functioning/Environment              Frequency  Min 2X/week        Progress Toward Goals  OT Goals(current goals can now be found in the care plan section)  Progress towards OT goals: Progressing toward goals  Acute Rehab OT Goals Patient Stated Goal: get to rehab OT Goal Formulation: With patient Time For Goal Achievement: 11/09/23 Potential to Achieve Goals: Fair ADL Goals Pt Will  Perform Grooming: with min assist;sitting Pt Will Perform Lower Body Dressing: with min assist;sitting/lateral leans Pt Will Transfer to Toilet: with min assist;stand pivot transfer Pt Will Perform Toileting - Clothing Manipulation and hygiene: with min assist;sit to/from stand  Plan      Co-evaluation                 AM-PAC OT 6 Clicks Daily Activity     Outcome Measure   Help from another person eating meals?: A Little Help from another person taking care of personal grooming?: A Little Help from another person toileting, which includes using toliet, bedpan, or urinal?: A Lot Help from another person bathing (including washing, rinsing, drying)?: A Lot Help from another person to put on and taking off regular upper body clothing?: A Lot Help from another person to put on and taking off regular lower body clothing?: A Lot 6 Click Score: 14    End of Session    OT Visit Diagnosis: Unsteadiness on feet (R26.81);Repeated falls (R29.6);Muscle weakness (generalized) (M62.81)   Activity Tolerance Patient tolerated treatment well   Patient Left in bed;with call bell/phone within reach;with bed alarm set   Nurse Communication          Time: 8469-8450 OT Time Calculation (min): 19 min  Charges: OT General Charges $OT Visit: 1 Visit OT Treatments $Neuromuscular Re-education: 8-22 mins  Rogers Clause, OT/L MSOT, 11/04/2023

## 2023-11-04 NOTE — Progress Notes (Signed)
 Physical Therapy Treatment Patient Details Name: Parker Brown MRN: [id number] DOB: [birth year] Today's Date: 11/04/2023   History of Present Illness Patient is a 68 year old male with fall, acute left basal ganglia ICH, hypertensive emergency. Repeat head CT stable. PMH: hypertension and polysubstance abuse    PT Comments  Pt was long sitting in bed upon arrival. Pt is alert, oriented and motivated. Agreeable to session and eager for OOB activity. Pt does have some impulsivity and remains at high risk of falls. Much improved R side deficits versus  on 10/30/23 session. Pt was able to stand pivot from EOB > recliner towards L with HHA +1 and author protecting R knee from buckling. Once in recliner, pushed out into hallway. Pt ambulated 2 x 12 ft with LUE support on hallway railing and with +2 safety for chair follow. Overall pt tolerates session well. Does fatigue quickly. Acute PT will continue to follow and progress per current POC.    If plan is discharge home, recommend the following: Two people to help with walking and/or transfers;Two people to help with bathing/dressing/bathroom;Assistance with cooking/housework;Direct supervision/assist for medications management;Direct supervision/assist for financial management;Assist for transportation;Help with stairs or ramp for entrance     Equipment Recommendations  Rolling walker (2 wheels);Wheelchair (measurements PT);Wheelchair cushion (measurements PT);Hospital bed;BSC/3in1 (If pt has to DC directly home, DME list above recommended)       Precautions / Restrictions Precautions Precautions: Fall Recall of Precautions/Restrictions: Impaired Restrictions Weight Bearing Restrictions Per Provider Order: No     Mobility  Bed Mobility Overal bed mobility: Needs Assistance Bed Mobility: Supine to Sit Rolling: Supervision Supine to sit: Supervision  General bed mobility comments: Pt is slightly impulsive. Vcs for slowing down. Pt  seems very excited to move.    Transfers Overall transfer level: Needs assistance Equipment used: 1 person hand held assist Transfers: Sit to/from Stand, Bed to chair/wheelchair/BSC Sit to Stand: Min assist Stand pivot transfers: Min assist, Mod assist  General transfer comment: Pt stood EOB with HHA +1 Author protected R knee form buckling however no buckling observed. Pt's slight impulsivity with pivot to recliner continues to make him high fall risk. Once in recliner, author pushed pt to hallway. Pt easily able to stand with LUE support on hallway railing with CGA-min.    Ambulation/Gait Ambulation/Gait assistance: +2 safety/equipment, Min assist, Mod assist (+2 FOR CHAIR FOLLOW) Gait Distance (Feet): 12 Feet Assistive device: 1 person hand held assist (RUE HHA +1) Gait Pattern/deviations: Step-to pattern, Decreased step length - right, Knees buckling, Decreased weight shift to left, Decreased stride length, Ataxic, Narrow base of support Gait velocity: decreased     General Gait Details: Pt ambulated with use of hallway railing 2 x 12' and 1 x 8'. Author protetcs R knee from buckling however pt is able to lateral wt shift to allow L/R LE progression. Poor coordination and placement of RLE however no buckling observed during LLE advancement.     Balance Overall balance assessment: Needs assistance Sitting-balance support: Feet supported, No upper extremity supported Sitting balance-Leahy Scale: Good     Standing balance support: Single extremity supported, During functional activity, Reliant on assistive device for balance Standing balance-Leahy Scale: Poor Standing balance comment: Pt remains high fall risk       Communication Communication Communication: Impaired Factors Affecting Communication: Difficulty expressing self  Cognition Arousal: Alert Behavior During Therapy: WFL for tasks assessed/performed      PT - Cognition Comments: Pt is A and O x 4 Following  commands: Impaired Following commands impaired: Follows one step commands inconsistently    Cueing Cueing Techniques: Verbal cues, Tactile cues     General Comments General comments (skin integrity, edema, etc.): Pt was educated and encouraged to perform RLE ther ex to promote return. Pt did perform AROM to RLE but continues to have severe weakness on RUE/RLE      Pertinent Vitals/Pain Pain Assessment Pain Assessment: No/denies pain Pain Score: 0-No pain Pain Intervention(s): Limited activity within patient's tolerance, Monitored during session, Premedicated before session, Repositioned     PT Goals (current goals can now be found in the care plan section) Acute Rehab PT Goals Patient Stated Goal: none stated Progress towards PT goals: Progressing toward goals    Frequency    Min 3X/week           Co-evaluation     PT goals addressed during session: Mobility/safety with mobility;Strengthening/ROM;Balance        AM-PAC PT 6 Clicks Mobility   Outcome Measure  Help needed turning from your back to your side while in a flat bed without using bedrails?: A Lot Help needed moving from lying on your back to sitting on the side of a flat bed without using bedrails?: A Lot Help needed moving to and from a bed to a chair (including a wheelchair)?: A Lot Help needed standing up from a chair using your arms (e.g., wheelchair or bedside chair)?: A Lot Help needed to walk in hospital room?: A Lot Help needed climbing 3-5 steps with a railing? : A Lot 6 Click Score: 12    End of Session Equipment Utilized During Treatment: Gait belt Activity Tolerance: Patient tolerated treatment well Patient left: in chair;with call bell/phone within reach;Other (comment);with chair alarm set;with nursing/sitter in room Manufacturing Engineer in room) Nurse Communication: Mobility status PT Visit Diagnosis: Hemiplegia and hemiparesis;Other abnormalities of gait and mobility (R26.89) Hemiplegia -  Right/Left: Right Hemiplegia - dominant/non-dominant: Dominant Hemiplegia - caused by: Nontraumatic intracerebral hemorrhage     Time: 8371-8348 PT Time Calculation (min) (ACUTE ONLY): 23 min  Charges:    $Gait Training: 8-22 mins $Neuromuscular Re-education: 8-22 mins PT General Charges $$ ACUTE PT VISIT: 1 Visit                    Rankin Essex PTA 11/04/23, 5:38 PM

## 2023-11-05 DIAGNOSIS — I6389 Other cerebral infarction: Secondary | ICD-10-CM | POA: Diagnosis not present

## 2023-11-05 DIAGNOSIS — Z515 Encounter for palliative care: Secondary | ICD-10-CM | POA: Diagnosis not present

## 2023-11-05 DIAGNOSIS — R29898 Other symptoms and signs involving the musculoskeletal system: Secondary | ICD-10-CM | POA: Diagnosis not present

## 2023-11-05 DIAGNOSIS — I619 Nontraumatic intracerebral hemorrhage, unspecified: Secondary | ICD-10-CM | POA: Diagnosis not present

## 2023-11-05 NOTE — Progress Notes (Signed)
  Chaplain On-Call received referral from Chaplain Resident Sari Hugh, and later found a Spiritual Care Consult Order from Lamarr Gunner, FNP. The referral and the Order were to provide Advance Directives information to the patient.  This Chaplain attempted to visit with the patient at three times:  [phone]  At each time, the patient was either attended by the medical team; speaking on the phone, or asleep.  Chaplain will refer to Oncoming Chaplain for follow up.  Chaplain Bebe Ardean EMERSON Hershal., [facility]

## 2023-11-05 NOTE — Progress Notes (Signed)
 Physical Therapy Treatment Patient Details Name: Parker Brown MRN: [id number] DOB: Feb 07, 1955 Today's Date: 11/05/2023   History of Present Illness Patient is a 68 year old male with fall, acute left basal ganglia ICH, hypertensive emergency. Repeat head CT stable. PMH: hypertension and polysubstance abuse    PT Comments  Pt was finishing up breakfast upon arrival. He remains A and motivated. Oriented and cooperative. Daughter called pt during session and pt request she bring some of his personal belongings. Pt also requested author assist with writing her contact information down and giving her an update on his progress. Pt continues to show improvements in his abilities however remains far from his baseline. He tolerated getting OOB towards L and standing to RW with RUE walker splint in place. Continues to be at high risk of falls with all standing activity. He ambulated~ 8 ft with mod assist to prevent scissoring and proper placement of RLE. Ataxic/scissoring gait with RLE advancement. Pt will benefit from > 3 hours of PT/OT daily to maximize his independence and safety with all ADLs while returning to PLOF.    If plan is discharge home, recommend the following: A lot of help with walking and/or transfers;A lot of help with bathing/dressing/bathroom;Assistance with cooking/housework;Direct supervision/assist for medications management;Direct supervision/assist for financial management;Assist for transportation;Help with stairs or ramp for entrance     Equipment Recommendations  Other (comment) (Defer to next level of care)       Precautions / Restrictions Precautions Precautions: Fall Recall of Precautions/Restrictions: Impaired Precaution/Restrictions Comments: SBP< 150 Restrictions Weight Bearing Restrictions Per Provider Order: No     Mobility  Bed Mobility Overal bed mobility: Needs Assistance Bed Mobility: Supine to Sit Rolling: Supervision Supine to sit: Min  assist  General bed mobility comments: min assist to safely achieve EOB short sitting. I'l appliued L shoe however requires assistance to apply R shoe    Transfers Overall transfer level: Needs assistance Equipment used: Rolling walker (2 wheels) (with RUE walkersplint) Transfers: Sit to/from Stand Sit to Stand: Min assist  General transfer comment: Pt stood from EOB surface and from recliner to walker with min assist + vcs for improved handplacement and technique    Ambulation/Gait Ambulation/Gait assistance: +2 safety/equipment, Min assist, Mod assist Gait Distance (Feet): 8 Feet Assistive device: Rolling walker (2 wheels) (+ RUE walker splint to grasp  RUE handple) Gait Pattern/deviations: Step-to pattern, Decreased step length - right, Knees buckling, Decreased weight shift to left, Decreased stride length, Ataxic, Narrow base of support Gait velocity: decreased  General Gait Details: Pt ambulated ~ 8 ft with RW + RUE walkersplint to assist to gripp weakness/holding RW on R handle    Balance Overall balance assessment: Needs assistance Sitting-balance support: Feet supported Sitting balance-Leahy Scale: Good Sitting balance - Comments: no LOB while seated EOB. pt I'ly applied L shoe but requires assistance to apply R shoe   Standing balance support: Bilateral upper extremity supported, During functional activity, Reliant on assistive device for balance Standing balance-Leahy Scale: Poor Standing balance comment: Pt remains at high risk of falls. required several occasions to prevent lOB/fall during gait training       Communication Communication Communication: No apparent difficulties  Cognition Arousal: Alert Behavior During Therapy: WFL for tasks assessed/performed, Impulsive   PT - Cognitive impairments: Safety/Judgement, Problem solving, Awareness   Orientation impairments: Time  PT - Cognition Comments: Pt remains A and O x 3. cooperative and motivated Following  commands: Intact Following commands impaired: Only follows one step commands  consistently    Cueing Cueing Techniques: Verbal cues, Tactile cues     General Comments General comments (skin integrity, edema, etc.): Discussed at length DC recs and that pt wopuld benefit form most aggressive rehab to assist pt towards PLOF. pt is agreeable to [facility] and knows that [facility] is offering bed in [place]. Pt's daughter called during session and per pt's request, author updated      Pertinent Vitals/Pain Pain Assessment Pain Assessment: 0-10 Pain Score: 4  Pain Location: Bilateral knee Pain Descriptors / Indicators: Discomfort Pain Intervention(s): Limited activity within patient's tolerance, Monitored during session, Repositioned     PT Goals (current goals can now be found in the care plan section) Acute Rehab PT Goals Patient Stated Goal: walk again Progress towards PT goals: Progressing toward goals    Frequency    Min 3X/week       Co-evaluation     PT goals addressed during session: Mobility/safety with mobility;Balance;Proper use of DME;Strengthening/ROM        AM-PAC PT 6 Clicks Mobility   Outcome Measure  Help needed turning from your back to your side while in a flat bed without using bedrails?: A Lot Help needed moving from lying on your back to sitting on the side of a flat bed without using bedrails?: A Lot Help needed moving to and from a bed to a chair (including a wheelchair)?: A Lot Help needed standing up from a chair using your arms (e.g., wheelchair or bedside chair)?: A Lot Help needed to walk in hospital room?: A Lot Help needed climbing 3-5 steps with a railing? : A Lot 6 Click Score: 12    End of Session Equipment Utilized During Treatment: Gait belt Activity Tolerance: Patient tolerated treatment well Patient left: in chair;with call bell/phone within reach;with chair alarm set Nurse Communication: Mobility status PT Visit  Diagnosis: Hemiplegia and hemiparesis;Other abnormalities of gait and mobility (R26.89) Hemiplegia - Right/Left: Right Hemiplegia - dominant/non-dominant: Dominant Hemiplegia - caused by: Nontraumatic intracerebral hemorrhage     Time: 9055-8992 PT Time Calculation (min) (ACUTE ONLY): 23 min  Charges:    $Gait Training: 8-22 mins $Therapeutic Activity: 8-22 mins PT General Charges $$ ACUTE PT VISIT: 1 Visit                    Rankin Essex PTA 11/05/23, 10:42 AM

## 2023-11-05 NOTE — Progress Notes (Signed)
   11/05/23 1730  Spiritual Encounters  Type of Visit Initial  Care provided to: Patient  Conversation partners present during encounter Nurse  Referral source Other (comment) ([facility])  Reason for visit Advance directives  OnCall Visit Yes   Chaplain visited patient per a [place] Consult in the EPIC system for an AD.  Patient has paperwork completed (in the windowsill) but Chaplain can't secure a notary and witnesses during the evening hours.  Chaplain will leave the Consult open so colleagues know patient needs to complete the process.  Chaplain didn't want to confuse anything for the patient by closing the Consult and asking him to request it be entered again so, Chaplain will let colleagues know the Consult is there.  Chaplain spoke to staff regarding patient being upset that clinical staff are coming in and speaking about God and faith.  He doesn't want anyone to speak to him about that.  Chaplain also shared this with staff verbally.     Rev. Rana M. Nicholaus, M.Div.  Chaplain Resident [facility]

## 2023-11-05 NOTE — Plan of Care (Signed)
  Problem: Education: Goal: Knowledge of disease or condition will improve Outcome: Progressing Goal: Knowledge of secondary prevention will improve (MUST DOCUMENT ALL) Outcome: Progressing   Problem: Coping: Goal: Will verbalize positive feelings about self Outcome: Progressing Goal: Will identify appropriate support needs Outcome: Progressing   Problem: Health Behavior/Discharge Planning: Goal: Ability to manage health-related needs will improve Outcome: Progressing Goal: Goals will be collaboratively established with patient/family Outcome: Progressing   Problem: Nutrition: Goal: Risk of aspiration will decrease Outcome: Progressing Goal: Dietary intake will improve Outcome: Progressing

## 2023-11-05 NOTE — Progress Notes (Signed)
 PROGRESS NOTE    Parker Brown  FMW:[id number] DOB: [birth year] DOA: 10/23/2023 PCP: Pcp, No    Brief Narrative:  68 y.o male with significant PMHx of COPD, polysubstance abuse (cocaine, marijuana, current everyday smoker), HTN, HLD, and arthritis who presented to the ED with chief complaints of right-sided weakness and a fall.   HPI: acute onset of right sided weakness which caused him to fall while walking to a neighbor's house. He denied hitting his head, LOC or any other associated symptoms. Patient admitted to smoking and using cocaine prior to presenting to the ED. On EMS arrival patient was noted with blood pressure of 204/104. 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 Headache reported in PM, CT head repeated 10/19: feels more weak in the right side, able to verbalize and follow commands. Repeat CT head ordered given NIH increased to 15. 10/20 persistent R hemiplegia. Weaned off cleviprex and ok to transfer to hospitalist. Plan placement for  inpatient rehab  10/21: EEG done pending read. If no concerns there can dc, if epileptiform can d/w neiro re: Rx but should not hold up dc  10/22.  EEG did not show any seizures but did show cortical dysfunction in the left hemisphere.  Patient had some agitation in the afternoon. 10/23.  Patient was able to move his right leg for me.  Patient has shoulder twitch right side. 10/24.  Patient able to straight leg raise with his right leg and flex in his right knee.  Patient able to lift right arm up off the bed but incoordinated with his hand and shoulder. 10/25.  Patient advised to continue doing exercises in the bed with straight leg raises and moving his arm. 10/26.  Patient able to extend with his right fingers and make a fist.  Patient  able to lift up his right arm but still a little uncoordinated.  Still no movement right ankle and foot but able to flex and extend at the knee and hip. 10/27.  Patient still not able to move right ankle and foot but able to straight leg raise and bend at the knee.  Little bit more movement out of his arm today 10/28.  Patient stated he walked with physical therapy today. 10/29: Bed offered at [facility]   Assessment & Plan:   Principal Problem:   Acute hemorrhagic infarction of brain [facility]) Active Problems:   Acute metabolic encephalopathy   Hypertensive emergency   Cocaine abuse (HCC)   Alcohol abuse   Intraparenchymal hemorrhage of brain (HCC)   Paralysis (HCC)   Weakness of right lower extremity   Weakness of right upper extremity  * Acute hemorrhagic infarction of brain [facility]) Neurology recommends no antiplatelet agents at this time.  Patient has right-sided weakness.  Patient's right side has improved during the hospital course.  Patient still with no movement of ankle and right foot.  Patient able to straight leg raise and flex at the knee.  Able to extend fingers very slowly and grip very slowly.  Able to bend at the elbow flex and extend.  Incoordination with the right shoulder.  Would benefit from continued treatment at a skilled nursing facility versus acute inpatient rehab as next level of care.  The patient is medically stable and motivated for improvement.   Plan: Patient is medically  stable and ready for discharge.  Patient will benefit from intensive inpatient rehab greater than 3 hours of therapy per day   Acute metabolic encephalopathy Resolved.  Mental status improved.  Not agitated.  Adherent with recommended therapy   Hypertensive emergency Secondary to cocaine use.  Blood pressure improved.  Continue Norvasc  and Cozaar   Cocaine abuse (HCC) Advised on cessation   Alcohol abuse No evidence of withdrawal     DVT prophylaxis: SCD Code Status: Full Family  Communication: None Disposition Plan: Status is: Inpatient Remains inpatient appropriate because: Unsafe discharge plan.  Need skilled nursing facility.   Level of care: Telemetry Medical  Consultants:  None  Procedures:  None  Antimicrobials: None   Subjective: Seen and examined.  Motivated and working with physical therapy.  No complaints.  Objective: Vitals:   11/04/23 2009 11/05/23 0451 11/05/23 0500 11/05/23 0828  BP: 124/80 117/73  (!) 134/97  Pulse: 72 75  73  Resp: 18 16  16   Temp:  98 F (36.7 C)  98 F (36.7 C)  TempSrc:  Oral  Oral  SpO2: 100% 100%  99%  Weight:   58.1 kg   Height:        Intake/Output Summary (Last 24 hours) at 11/05/2023 1108 Last data filed at 11/05/2023 1041 Gross per 24 hour  Intake --  Output 925 ml  Net -925 ml   Filed Weights   11/03/23 0500 11/04/23 0500 11/05/23 0500  Weight: 51.1 kg 56.5 kg 58.1 kg    Examination:  General exam: NAD Respiratory system: Clear.  Normal work of breathing.  Room air Cardiovascular system: S1 S2, RRR, no murmurs, no pedal edema Gastrointestinal system: Thin, soft, NT/ND, normal bowel sounds Central nervous system: Alert.  Oriented x 3.  Decreased range of motion right lower extremity.  Weak grip strength on right Extremities: Symmetric 5 x 5 power. Skin: No rashes, lesions or ulcers Psychiatry: Judgement and insight appear normal. Mood & affect appropriate.     Data Reviewed: I have personally reviewed following labs and imaging studies  CBC: Recent Labs  Lab 10/31/23 0602 11/04/23 0436  WBC 4.3 4.6  HGB 12.4* 12.3*  HCT 37.9* 37.9*  MCV 90.2 90.7  PLT 245 301   Basic Metabolic Panel: Recent Labs  Lab 10/31/23 0602 11/04/23 0436  NA 136 138  K 4.1 4.1  CL 103 102  CO2 23 23  GLUCOSE 105* 96  BUN 26* 26*  CREATININE 1.19 1.12  CALCIUM 8.6* 9.1   GFR: Estimated Creatinine Clearance: 50.4 mL/min (by C-G formula based on SCr of 1.12 mg/dL). Liver Function Tests: No  results for input(s): AST, ALT, ALKPHOS, BILITOT, PROT, ALBUMIN in the last 168 hours. No results for input(s): LIPASE, AMYLASE in the last 168 hours. No results for input(s): AMMONIA in the last 168 hours. Coagulation Profile: No results for input(s): INR, PROTIME in the last 168 hours. Cardiac Enzymes: No results for input(s): CKTOTAL, CKMB, CKMBINDEX, TROPONINI in the last 168 hours. BNP (last 3 results) No results for input(s): PROBNP in the last 8760 hours. HbA1C: No results for input(s): HGBA1C in the last 72 hours. CBG: Recent Labs  Lab 11/01/23 0759 11/01/23 1210 11/01/23 1637 11/02/23 0753 11/04/23 1650  GLUCAP 109* 109* 128* 89 102*   Lipid Profile: No results for input(s): CHOL, HDL, LDLCALC, TRIG, CHOLHDL, LDLDIRECT in the last 72 hours. Thyroid Function Tests: No results for input(s): TSH, T4TOTAL, FREET4, T3FREE, THYROIDAB in the last 72 hours. Anemia Panel: No  results for input(s): VITAMINB12, FOLATE, FERRITIN, TIBC, IRON, RETICCTPCT in the last 72 hours. Sepsis Labs: No results for input(s): PROCALCITON, LATICACIDVEN in the last 168 hours.  No results found for this or any previous visit (from the past 240 hours).       Radiology Studies: No results found.      Scheduled Meds:  amLODipine   10 mg Oral Daily   folic acid  1 mg Oral Daily   losartan  100 mg Oral Daily   multivitamin with minerals  1 tablet Oral Daily   thiamine  100 mg Oral Daily   Or   thiamine  100 mg Intravenous Daily   traZODone   75 mg Oral QHS   Continuous Infusions:   LOS: 12 days       Calvin KATHEE Robson, MD [facility]   If 7PM-7AM, please contact night-coverage  11/05/2023, 11:08 AM

## 2023-11-05 NOTE — TOC Progression Note (Addendum)
 Transition of Care [facility]) - Progression Note    Patient Details  Name: Parker Brown MRN: [id number] Date of Birth: 1955/06/28  [facility] CM/SW Contact  Dalia GORMAN Fuse, RN Phone Number: 11/05/2023, 8:38 AM  Clinical Narrative:     Therapy continues to recommend CIR for this patient. Referral faxed to [facility] [phone] and TOC lvmm with Deatrice [phone] in admissions. TOC sent referral to [facility] in the hub.  TOC will continue to follow for discharge planning.  [facility] has offered a bed.   TOC spoke with [place] at the [facility]. To review the request they need the following:  Completed and signed RFS form  H&P   MD Progress notes including documentation the patient is medically ready for discharge and that he will benefit from > 3 hours of therapy a day.   Therapy notes including documentation that he will benefit from > 3 hours of therapy a day.   MAR  12:00 TOC received a call from [facility]. TOC advised the MD notes and PT notes are available. [facility] to start ins auth. They will accept the patient today if shara is obtained.  10/30: TOC received call from [place] at [facility], they had questions about whether the patient has a solid discharge plan post SNF. TOC advised the patient has accepted a bed at [facility] and ins shara is pending. Deatrice advised he will hold off on his assessment at this time.  1500: TOC received a call from [facility]. The patient is not active with a physician at the [facility], so they will not approve his admission to inpatient rehab. The patient also has [employer] Medicare, ins shara has been started with [employer]. Ins shara is pending.  TOC will continue to follow.   Barriers to Discharge: Continued Medical Work up               Expected Discharge Plan and Services       Living arrangements for the past 2 months: Single Family Home Expected Discharge Date: 10/31/23                                     Social  Drivers of Health (SDOH) Interventions SDOH Screenings   Food Insecurity: No Food Insecurity (10/24/2023)  Housing: Low Risk  (10/24/2023)  Transportation Needs: No Transportation Needs (10/24/2023)  Utilities: Not At Risk (10/24/2023)  Social Connections: Socially Isolated (10/24/2023)  Tobacco Use: High Risk (10/23/2023)    Readmission Risk Interventions    10/27/2023   11:45 AM  Readmission Risk Prevention Plan  Transportation Screening Complete  PCP or Specialist Appt within 5-7 Days Complete  Medication Review (RN CM) Complete

## 2023-11-05 NOTE — Progress Notes (Signed)
 Occupational Therapy Treatment Patient Details Name: Parker Brown MRN: [id number] DOB: [birth year] Today's Date: 11/05/2023   History of present illness Patient is a 68 year old male with fall, acute left basal ganglia ICH, hypertensive emergency. Repeat head CT stable. PMH: hypertension and polysubstance abuse   OT comments  Patient seen for OT treatment on this date. Upon arrival to room patient on phone with daughter, but agreeable to treatment. Patient got to EOB with supervision and increased time, he began discussing his discharged plan, OT noted decreased clarity in speech, when attempting to further assess change patient reports everyone always thinks I'm agitated. Patient was tangential, difficult to redirect and became agitated discussing how his sister is always stealing from him, he was cursing and OT was unable to redirect him. OT did briefly reassess his RUE to make sure no changes in mobility (RUE presenting similarly to yesterday). OT attempted to have patient return to bed but he was refusing, RN present at this time- OT left room to notify patients RN of change in status and sent secure chat to team. Patients RN and PT in room.        If plan is discharge home, recommend the following:  Two people to help with walking and/or transfers;Two people to help with bathing/dressing/bathroom;Assistance with cooking/housework;Assistance with feeding;Direct supervision/assist for medications management;Assist for transportation;Direct supervision/assist for financial management;Help with stairs or ramp for entrance;Supervision due to cognitive status   Equipment Recommendations  Other (comment) (defer to next venue of care)    Recommendations for Other Services      Precautions / Restrictions Precautions Precautions: Fall Recall of Precautions/Restrictions: Impaired Restrictions Weight Bearing Restrictions Per Provider Order: No       Mobility Bed Mobility Overal  bed mobility: Needs Assistance Bed Mobility: Supine to Sit     Supine to sit: Supervision          Transfers                         Balance   Sitting-balance support: Feet supported Sitting balance-Leahy Scale: Good                                     ADL either performed or assessed with clinical judgement   ADL                                              Extremity/Trunk Assessment              Vision       Perception     Praxis     Communication Communication Communication: Impaired Factors Affecting Communication: Reduced clarity of speech   Cognition Arousal: Alert Behavior During Therapy: Agitated Cognition: Cognition impaired             OT - Cognition Comments: agitated, cursing, upset about family dynamics (primarily his sister)`                 Following commands: Impaired Following commands impaired: Follows one step commands inconsistently      Cueing   Cueing Techniques: Verbal cues  Exercises      Shoulder Instructions       General Comments      Pertinent Vitals/ Pain  Pain Assessment Pain Assessment: No/denies pain  Home Living                                          Prior Functioning/Environment              Frequency  Min 2X/week        Progress Toward Goals  OT Goals(current goals can now be found in the care plan section)  Progress towards OT goals: Progressing toward goals  ADL Goals Pt Will Perform Grooming: with min assist;sitting Pt Will Perform Lower Body Dressing: with min assist;sitting/lateral leans Pt Will Transfer to Toilet: with min assist;stand pivot transfer Pt Will Perform Toileting - Clothing Manipulation and hygiene: with min assist;sit to/from stand  Plan      Co-evaluation                 AM-PAC OT 6 Clicks Daily Activity     Outcome Measure   Help from another person eating meals?: A  Little Help from another person taking care of personal grooming?: A Little Help from another person toileting, which includes using toliet, bedpan, or urinal?: A Lot Help from another person bathing (including washing, rinsing, drying)?: A Lot Help from another person to put on and taking off regular upper body clothing?: A Lot Help from another person to put on and taking off regular lower body clothing?: A Lot 6 Click Score: 14    End of Session    OT Visit Diagnosis: Unsteadiness on feet (R26.81);Repeated falls (R29.6);Muscle weakness (generalized) (M62.81)   Activity Tolerance Treatment limited secondary to agitation   Patient Left in bed;with bed alarm set;with nursing/sitter in room   Nurse Communication Other (comment) (agitated)        Time: 8579-8550 OT Time Calculation (min): 29 min  Charges: OT General Charges $OT Visit: 1 Visit  Rogers Clause, OT/L MSOT, 11/05/2023

## 2023-11-05 NOTE — Progress Notes (Signed)
 Palliative Care Progress Note, Assessment & Plan   Patient Name: Parker Brown       Date: 11/05/2023 DOB: 1955-11-22  Age: 68 y.o. MRN#: [id number] Attending Physician: Jhonny Calvin NOVAK, MD Primary Care Physician: Pcp, No Admit Date: 10/23/2023  Subjective: Patient is out of bed and sitting in chair, washing himself.  Breakfast tray is in front of him.  He acknowledges the presence, is able to make his wishes known.  No family or friends present at bedside during my visit.  HPI: 68 y.o. male  with past medical history of COPD, HTN, HLD, current tobacco use, polysubstance abuse (cocaine, marijuana), PTSD, osteoarthritis with chronic low back pain, diverticulosis, carpal tunnel syndrome, and chronic hep C admitted on 10/23/2023 with right sided weakness.   As per chart review, patient experienced right sided weakness resulting in fall while walking to his neighbor's house.  He was found outside lying on the ground by his sister who subsequently called EMS.    Upon presentation to the ED, patient was hypertensive.  Additionally, RN noted patient was obviously intoxicated (Etoh negative) and endorsed striking his head with no loss of consciousness during fall.   Stat noncontrast CT obtained which revealed acute left basal ganglia intraparenchymal hemorrhage with mild edema and minimal mass effect.  Follow-up CTA of head and neck revealed no acute intracranial abnormalities or LVO.   Patient is being treated for intracranial hemorrhage with hemorrhagic stroke, hypertensive emergency, and cocaine abuse.   PMT was consulted to support patient and family with goals of care discussions.  Summary of counseling/coordination of care: Extensive chart review completed prior to meeting patient  including labs, vital signs, imaging, progress notes, orders, and available advanced directive documents from current and previous encounters.   After reviewing the patient's chart and assessing the patient at bedside, I spoke with patient in regards to symptom management and goals of care.   Patient endorses he is ready to get out of here. He shares he needs to go to the bathroom.  He request that I return at a later date/time.  Prior to leaving to allow privacy for toileting (urinal while sitting in recliner) patient denies pain or discomfort at this time.  No adjustment to [facility] needed.  After visiting with the patient, spiritual care consult placed for creation of advance directive.  Patient has paperwork at bedside and would like to leave his ex-wife understanding this is next of kin decision maker.  Spiritual.  To follow-up to complete AD.  As per chart review of this afternoon, [facility] and may be discharged today.  No change to plan of care at this time.  PMT will continue to follow and support.  Physical Exam Vitals reviewed.  Constitutional:      General: He is not in acute distress.    Appearance: He is normal weight.  HENT:     Head: Normocephalic.     Nose: Nose normal.     Mouth/Throat:     Mouth: Mucous membranes are moist.  Eyes:     Pupils: Pupils are equal, round, and reactive to light.  Pulmonary:     Effort: Pulmonary effort is normal.  Abdominal:     Palpations: Abdomen is soft.  Musculoskeletal:     Comments: LLE weakness MAETC  Skin:    General: Skin is warm and dry.  Neurological:     Mental Status: He is alert and oriented to person, place, and time.             25 minute visit includes: Detailed review of medical records (labs, imaging, vital signs), medically appropriate exam (mental status, respiratory, cardiac, skin), discussed with treatment team, counseling and educating patient, family and staff, documenting clinical  information, medication management and coordination of care.  Parker L. Arvid, DNP, FNP-BC Palliative Medicine Team

## 2023-11-05 NOTE — Plan of Care (Signed)
  Problem: Education: Goal: Knowledge of secondary prevention will improve (MUST DOCUMENT ALL) Outcome: Progressing   Problem: Intracerebral Hemorrhage Tissue Perfusion: Goal: Complications of Intracerebral Hemorrhage will be minimized Outcome: Progressing   Problem: Coping: Goal: Will identify appropriate support needs Outcome: Progressing   Problem: Health Behavior/Discharge Planning: Goal: Goals will be collaboratively established with patient/family Outcome: Progressing

## 2023-11-06 DIAGNOSIS — I619 Nontraumatic intracerebral hemorrhage, unspecified: Secondary | ICD-10-CM | POA: Diagnosis not present

## 2023-11-06 DIAGNOSIS — R29898 Other symptoms and signs involving the musculoskeletal system: Secondary | ICD-10-CM | POA: Diagnosis not present

## 2023-11-06 DIAGNOSIS — I6389 Other cerebral infarction: Secondary | ICD-10-CM | POA: Diagnosis not present

## 2023-11-06 DIAGNOSIS — Z515 Encounter for palliative care: Secondary | ICD-10-CM | POA: Diagnosis not present

## 2023-11-06 NOTE — Plan of Care (Signed)
  Problem: Education: Goal: Knowledge of disease or condition will improve Outcome: Progressing Goal: Knowledge of secondary prevention will improve (MUST DOCUMENT ALL) Outcome: Progressing Goal: Knowledge of patient specific risk factors will improve (DELETE if not current risk factor) Outcome: Progressing   Problem: Intracerebral Hemorrhage Tissue Perfusion: Goal: Complications of Intracerebral Hemorrhage will be minimized Outcome: Progressing   Problem: Coping: Goal: Will verbalize positive feelings about self Outcome: Progressing Goal: Will identify appropriate support needs Outcome: Progressing   Problem: Self-Care: Goal: Ability to participate in self-care as condition permits will improve Outcome: Progressing Goal: Verbalization of feelings and concerns over difficulty with self-care will improve Outcome: Progressing Goal: Ability to communicate needs accurately will improve Outcome: Progressing   Problem: Education: Goal: Knowledge of General Education information will improve Description: Including pain rating scale, medication(s)/side effects and non-pharmacologic comfort measures Outcome: Progressing   Problem: Health Behavior/Discharge Planning: Goal: Ability to manage health-related needs will improve Outcome: Progressing

## 2023-11-06 NOTE — Progress Notes (Signed)
   11/06/23 1530  Spiritual Encounters  Type of Visit Follow up  Care provided to: Patient  Reason for visit Advance directives  Spiritual Care Plan  Spiritual Care Issues Still Outstanding No further spiritual care needs at this time (see row info)   Chaplain attempted to complete AD, however, pt insisted he had already completed the form at the [facility] in [place] and here at [facility].

## 2023-11-06 NOTE — Progress Notes (Signed)
 PROGRESS NOTE    Parker Brown  FMW:[id number] DOB: 30-Oct-1955 DOA: 10/23/2023 PCP: Pcp, No    Brief Narrative:  68 y.o male with significant PMHx of COPD, polysubstance abuse (cocaine, marijuana, current everyday smoker), HTN, HLD, and arthritis who presented to the ED with chief complaints of right-sided weakness and a fall.   HPI: acute onset of right sided weakness which caused him to fall while walking to a neighbor's house. He denied hitting his head, LOC or any other associated symptoms. Patient admitted to smoking and using cocaine prior to presenting to the ED. On EMS arrival patient was noted with blood pressure of 204/104. 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 Headache reported in PM, CT head repeated 10/19: feels more weak in the right side, able to verbalize and follow commands. Repeat CT head ordered given NIH increased to 15. 10/20 persistent R hemiplegia. Weaned off cleviprex and ok to transfer to hospitalist. Plan placement for  inpatient rehab  10/21: EEG done pending read. If no concerns there can dc, if epileptiform can d/w neiro re: Rx but should not hold up dc  10/22.  EEG did not show any seizures but did show cortical dysfunction in the left hemisphere.  Patient had some agitation in the afternoon. 10/23.  Patient was able to move his right leg for me.  Patient has shoulder twitch right side. 10/24.  Patient able to straight leg raise with his right leg and flex in his right knee.  Patient able to lift right arm up off the bed but incoordinated with his hand and shoulder. 10/25.  Patient advised to continue doing exercises in the bed with straight leg raises and moving his arm. 10/26.  Patient able to extend with his right fingers and make a fist.  Patient  able to lift up his right arm but still a little uncoordinated.  Still no movement right ankle and foot but able to flex and extend at the knee and hip. 10/27.  Patient still not able to move right ankle and foot but able to straight leg raise and bend at the knee.  Little bit more movement out of his arm today 10/28.  Patient stated he walked with physical therapy today. 10/29: Bed offered at [facility] 10/31: Pending insurance authorization for [facility]   Assessment & Plan:   Principal Problem:   Acute hemorrhagic infarction of brain [facility]) Active Problems:   Acute metabolic encephalopathy   Hypertensive emergency   Cocaine abuse (HCC)   Alcohol abuse   Intraparenchymal hemorrhage of brain (HCC)   Paralysis (HCC)   Weakness of right lower extremity   Weakness of right upper extremity  * Acute hemorrhagic infarction of brain [facility]) Neurology recommends no antiplatelet agents at this time.  Patient has right-sided weakness.  Patient's right side has improved during the hospital course.  Patient still with no movement of ankle and right foot.  Patient able to straight leg raise and flex at the knee.  Able to extend fingers very slowly and grip very slowly.  Able to bend at the elbow flex and extend.  Incoordination with the right shoulder.  Would benefit from continued treatment at a skilled nursing facility versus acute inpatient rehab as next level of care.  The patient is medically stable and motivated for improvement.  Plan: Patient remains medically stable and ready for discharge.  Patient will benefit from intensive inpatient rehab greater than 3 hours of therapy per day   Acute metabolic encephalopathy Resolved.  Mental status improved.  Not agitated.  Adherent with recommended therapy   Hypertensive emergency Secondary to cocaine use.  Blood pressure improved.  Continue Norvasc  and Cozaar   Cocaine abuse (HCC) Advised on cessation   Alcohol abuse No evidence of withdrawal      DVT prophylaxis: SCD Code Status: Full Family Communication: Daughter at bedside 10/31 Disposition Plan: Status is: Inpatient Remains inpatient appropriate because: Unsafe discharge plan.  Need skilled nursing facility.   Level of care: Telemetry Medical  Consultants:  None  Procedures:  None  Antimicrobials: None   Subjective: Seen and examined.  No acute events overnight.  No new complaints  Objective: Vitals:   11/05/23 1615 11/05/23 1924 11/06/23 0450 11/06/23 0744  BP: 125/80 124/68 112/76 126/88  Pulse: 72 76 73 74  Resp: 16 18 18 18   Temp: 97.8 F (36.6 C) 98.1 F (36.7 C) 98.3 F (36.8 C) 97.7 F (36.5 C)  TempSrc:   Oral Oral  SpO2: 99% 100% 100% 100%  Weight:      Height:        Intake/Output Summary (Last 24 hours) at 11/06/2023 1444 Last data filed at 11/06/2023 1300 Gross per 24 hour  Intake 1080 ml  Output 1325 ml  Net -245 ml   Filed Weights   11/03/23 0500 11/04/23 0500 11/05/23 0500  Weight: 51.1 kg 56.5 kg 58.1 kg    Examination:  General exam: No acute distress Respiratory system: Lungs clear.  Normal work of breathing.  Room air Cardiovascular system: S1 S2, RRR, no murmurs, no pedal edema Gastrointestinal system: Thin, soft, NT/ND, normal bowel sounds Central nervous system: Alert.  Oriented x 3.  Decreased range of motion right lower extremity.  Weak grip strength on right Extremities: Symmetric 5 x 5 power. Skin: No rashes, lesions or ulcers Psychiatry: Judgement and insight appear normal. Mood & affect appropriate.     Data Reviewed: I have personally reviewed following labs and imaging studies  CBC: Recent Labs  Lab 10/31/23 0602 11/04/23 0436  WBC 4.3 4.6  HGB 12.4* 12.3*  HCT 37.9* 37.9*  MCV 90.2 90.7  PLT 245 301   Basic Metabolic Panel: Recent Labs  Lab 10/31/23 0602 11/04/23 0436  NA 136 138  K 4.1 4.1  CL 103 102  CO2 23 23  GLUCOSE 105* 96  BUN 26* 26*  CREATININE 1.19 1.12  CALCIUM 8.6*  9.1   GFR: Estimated Creatinine Clearance: 50.4 mL/min (by C-G formula based on SCr of 1.12 mg/dL). Liver Function Tests: No results for input(s): AST, ALT, ALKPHOS, BILITOT, PROT, ALBUMIN in the last 168 hours. No results for input(s): LIPASE, AMYLASE in the last 168 hours. No results for input(s): AMMONIA in the last 168 hours. Coagulation Profile: No results for input(s): INR, PROTIME in the last 168 hours. Cardiac Enzymes: No results for input(s): CKTOTAL, CKMB, CKMBINDEX, TROPONINI in the last 168 hours. BNP (last 3 results) No results for input(s): PROBNP in the last 8760 hours. HbA1C: No results for input(s): HGBA1C in the last 72 hours. CBG: Recent Labs  Lab 11/01/23 0759 11/01/23 1210 11/01/23 1637 11/02/23 0753 11/04/23 1650  GLUCAP 109* 109* 128* 89 102*   Lipid Profile: No results for input(s): CHOL, HDL, LDLCALC, TRIG, CHOLHDL, LDLDIRECT in the last 72 hours. Thyroid Function Tests: No results for  input(s): TSH, T4TOTAL, FREET4, T3FREE, THYROIDAB in the last 72 hours. Anemia Panel: No results for input(s): VITAMINB12, FOLATE, FERRITIN, TIBC, IRON, RETICCTPCT in the last 72 hours. Sepsis Labs: No results for input(s): PROCALCITON, LATICACIDVEN in the last 168 hours.  No results found for this or any previous visit (from the past 240 hours).       Radiology Studies: No results found.      Scheduled Meds:  amLODipine   10 mg Oral Daily   folic acid  1 mg Oral Daily   losartan  100 mg Oral Daily   multivitamin with minerals  1 tablet Oral Daily   thiamine  100 mg Oral Daily   Or   thiamine  100 mg Intravenous Daily   traZODone   75 mg Oral QHS   Continuous Infusions:   LOS: 13 days       Calvin KATHEE Robson, MD [facility]   If 7PM-7AM, please contact night-coverage  11/06/2023, 2:44 PM

## 2023-11-06 NOTE — Plan of Care (Signed)
  Problem: Education: Goal: Knowledge of secondary prevention will improve (MUST DOCUMENT ALL) Outcome: Progressing   Problem: Intracerebral Hemorrhage Tissue Perfusion: Goal: Complications of Intracerebral Hemorrhage will be minimized Outcome: Progressing   Problem: Health Behavior/Discharge Planning: Goal: Ability to manage health-related needs will improve Outcome: Progressing Goal: Goals will be collaboratively established with patient/family Outcome: Progressing

## 2023-11-06 NOTE — Progress Notes (Signed)
 Palliative Care Progress Note, Assessment & Plan   Patient Name: Parker Brown       Date: 11/06/2023 DOB: 09/06/1955  Age: 68 y.o. MRN#: [id number] Attending Physician: Jhonny Calvin NOVAK, MD Primary Care Physician: Pcp, No Admit Date: 10/23/2023  Subjective: Patient is in bed, awake and alert.  His daughter Parker Brown is at bedside during my visit.  Patient acknowledges my presence and is able to make his wishes known.  HPI: 68 y.o. male  with past medical history of COPD, HTN, HLD, current tobacco use, polysubstance abuse (cocaine, marijuana), PTSD, osteoarthritis with chronic low back pain, diverticulosis, carpal tunnel syndrome, and chronic hep C admitted on 10/23/2023 with right sided weakness.   As per chart review, patient experienced right sided weakness resulting in fall while walking to his neighbor's house.  He was found outside lying on the ground by his sister who subsequently called EMS.    Upon presentation to the ED, patient was hypertensive.  Additionally, RN noted patient was obviously intoxicated (Etoh negative) and endorsed striking his head with no loss of consciousness during fall.   Stat noncontrast CT obtained which revealed acute left basal ganglia intraparenchymal hemorrhage with mild edema and minimal mass effect.  Follow-up CTA of head and neck revealed no acute intracranial abnormalities or LVO.   Patient is being treated for intracranial hemorrhage with hemorrhagic stroke, hypertensive emergency, and cocaine abuse.   PMT was consulted to support patient and family with goals of care discussions.  Summary of counseling/coordination of care: Extensive chart review completed prior to meeting patient including labs, vital signs, imaging, progress notes, orders, and  available advanced directive documents from current and previous encounters.   After reviewing the patient's chart and assessing the patient at bedside, I spoke with patient in regards to symptom management and goals of care.   Symptoms assessed.  Patient denies pain, discomfort, N/V/D, and other acute issues at this time.  He shares he is ready to continue with rehab and believes he will be discharged today.  Discussed that TOC is following closely for discharge planning.  Again discussed importance of medication adherence as risk of stroke is significantly increased given patient has already suffered a stroke.  Patient again discussed that he would like to complete his advance directive.  He shares he is completed up until the point he needs a conservation officer, nature and witnesses.  I again shared that spiritual care will need to complete documentation with patient.  Requested that spiritual care meet with patient again in an attempt to complete AD before discharge.  Symptom burden is low.  Goals are clear.  PMT will step back from daily visits and monitor the patient peripherally.  Please re-engage with PMT if goals change, at patient/family's request, or if patient's health deteriorates during hospitalization.    Physical Exam Vitals reviewed.  Constitutional:      General: He is not in acute distress.    Appearance: He is normal weight.  HENT:     Head: Normocephalic.     Nose: Nose normal.     Mouth/Throat:     Mouth: Mucous membranes are moist.  Eyes:     Pupils: Pupils are equal, round, and  reactive to light.  Pulmonary:     Effort: Pulmonary effort is normal.  Abdominal:     Palpations: Abdomen is soft.  Musculoskeletal:     Comments: RLE weakness, RUE weakness MAETC  Skin:    General: Skin is warm and dry.  Neurological:     Mental Status: He is alert and oriented to person, place, and time.  Psychiatric:        Mood and Affect: Mood normal.        Behavior: Behavior normal.              50 minute visit includes: Detailed review of medical records (labs, imaging, vital signs), medically appropriate exam (mental status, respiratory, cardiac, skin), discussed with treatment team, counseling and educating patient, family and staff, documenting clinical information, medication management and coordination of care.  Parker L. Arvid, DNP, FNP-BC Palliative Medicine Team

## 2023-11-07 DIAGNOSIS — I6389 Other cerebral infarction: Secondary | ICD-10-CM | POA: Diagnosis not present

## 2023-11-07 NOTE — TOC Progression Note (Addendum)
 Transition of Care [facility]) - Progression Note    Patient Details  Name: Parker Brown MRN: [id number] Date of Birth: 1955/11/28  Transition of Care [facility]) CM/SW Contact  Victory Jackquline RAMAN, RN Phone Number: 11/07/2023, 2:05 PM  Clinical Narrative:  RNCM received a secure chat from the MD and the PT stating that the patient remains medically stable for discharge and wanted to know if the insurance shara has been approved.   I followed up the CMA and she doesn't see an auth started in the system and that she get one started.  2:20pm: CMA informed me that she called [facility] and they said that they started the Auth and that it is still pending.   RNCM will continue to follow for discharge planning/care coordination and update as applicable.       Barriers to Discharge: Continued Medical Work up               Expected Discharge Plan and Services       Living arrangements for the past 2 months: Single Family Home Expected Discharge Date: 10/31/23                                     Social Drivers of Health (SDOH) Interventions SDOH Screenings   Food Insecurity: No Food Insecurity (10/24/2023)  Housing: Low Risk  (10/24/2023)  Transportation Needs: No Transportation Needs (10/24/2023)  Utilities: Not At Risk (10/24/2023)  Social Connections: Socially Isolated (10/24/2023)  Tobacco Use: High Risk (10/23/2023)    Readmission Risk Interventions    10/27/2023   11:45 AM  Readmission Risk Prevention Plan  Transportation Screening Complete  PCP or Specialist Appt within 5-7 Days Complete  Medication Review (RN CM) Complete

## 2023-11-07 NOTE — Progress Notes (Signed)
 PROGRESS NOTE    Parker Brown  FMW:[id number] DOB: 08/16/1955 DOA: 10/23/2023 PCP: Pcp, No    Brief Narrative:  68 y.o male with significant PMHx of COPD, polysubstance abuse (cocaine, marijuana, current everyday smoker), HTN, HLD, and arthritis who presented to the ED with chief complaints of right-sided weakness and a fall.   HPI: acute onset of right sided weakness which caused him to fall while walking to a neighbor's house. He denied hitting his head, LOC or any other associated symptoms. Patient admitted to smoking and using cocaine prior to presenting to the ED. On EMS arrival patient was noted with blood pressure of 204/104. 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 Headache reported in PM, CT head repeated 10/19: feels more weak in the right side, able to verbalize and follow commands. Repeat CT head ordered given NIH increased to 15. 10/20 persistent R hemiplegia. Weaned off cleviprex and ok to transfer to hospitalist. Plan placement for  inpatient rehab  10/21: EEG done pending read. If no concerns there can dc, if epileptiform can d/w neiro re: Rx but should not hold up dc  10/22.  EEG did not show any seizures but did show cortical dysfunction in the left hemisphere.  Patient had some agitation in the afternoon. 10/23.  Patient was able to move his right leg for me.  Patient has shoulder twitch right side. 10/24.  Patient able to straight leg raise with his right leg and flex in his right knee.  Patient able to lift right arm up off the bed but incoordinated with his hand and shoulder. 10/25.  Patient advised to continue doing exercises in the bed with straight leg raises and moving his arm. 10/26.  Patient able to extend with his right fingers and make a fist.  Patient  able to lift up his right arm but still a little uncoordinated.  Still no movement right ankle and foot but able to flex and extend at the knee and hip. 10/27.  Patient still not able to move right ankle and foot but able to straight leg raise and bend at the knee.  Little bit more movement out of his arm today 10/28.  Patient stated he walked with physical therapy today. 10/29: [facility] offered at [facility] 10/31: Pending insurance authorization for [facility]   Assessment & Plan:   Principal Problem:   Acute hemorrhagic infarction of brain [facility]) Active Problems:   Acute metabolic encephalopathy   Hypertensive emergency   Cocaine abuse (HCC)   Alcohol abuse   Intraparenchymal hemorrhage of brain (HCC)   Paralysis (HCC)   Weakness of right lower extremity   Weakness of right upper extremity  * Acute hemorrhagic infarction of brain [facility]) Neurology recommends no antiplatelet agents at this time.  Patient has right-sided weakness.  Patient's right side has improved during the hospital course.  Patient still with no movement of ankle and right foot.  Patient able to straight leg raise and flex at the knee.  Able to extend fingers very slowly and grip very slowly.  Able to bend at the elbow flex and extend.  Incoordination with the right shoulder.  Would benefit from continued treatment at a skilled nursing facility versus acute inpatient rehab as next level of care.  The patient is medically stable and motivated for improvement.  Plan: Patient remains medically stable and ready for discharge.  Patient will benefit from intensive inpatient rehab greater than 3 hours of therapy per day.  We are currently pending insurance authorization for skilled nursing facility.   Acute metabolic encephalopathy Resolved.  Mental status improved.  Not agitated.  Adherent with recommended therapy   Hypertensive emergency Secondary to cocaine use.  Blood pressure improved.  Continue Norvasc  and Cozaar   Cocaine  abuse (HCC) Advised on cessation   Alcohol abuse No evidence of withdrawal     DVT prophylaxis: SCD Code Status: Full Family Communication: Daughter at bedside 10/31 Disposition Plan: Status is: Inpatient Remains inpatient appropriate because: Unsafe discharge plan.  Need skilled nursing facility.   Level of care: Telemetry Medical  Consultants:  None  Procedures:  None  Antimicrobials: None   Subjective: Seen and examined.  Sitting up in chair.  No acute events overnight.  No new complaints.  Objective: Vitals:   11/06/23 1624 11/06/23 1941 11/07/23 0422 11/07/23 0825  BP: 137/80 (!) 140/90 124/79 128/85  Pulse: 82 84 88 73  Resp:  18 18 20   Temp: 98 F (36.7 C) 98.4 F (36.9 C) 98.2 F (36.8 C) 98.2 F (36.8 C)  TempSrc: Oral Oral    SpO2: 100% 100% 100% 100%  Weight:      Height:        Intake/Output Summary (Last 24 hours) at 11/07/2023 1124 Last data filed at 11/07/2023 0145 Gross per 24 hour  Intake 360 ml  Output 700 ml  Net -340 ml   Filed Weights   11/03/23 0500 11/04/23 0500 11/05/23 0500  Weight: 51.1 kg 56.5 kg 58.1 kg    Examination:  General exam: NAD Respiratory system: Clear.  Normal breathing.  Room air Cardiovascular system: S1 S2, RRR, no murmurs, no pedal edema Gastrointestinal system: Thin, soft, NT/ND, normal bowel sounds Central nervous system: Alert.  Oriented x 3.  Decreased range of motion right lower extremity.  Weak grip strength on right Extremities: Symmetric 5 x 5 power. Skin: No rashes, lesions or ulcers Psychiatry: Judgement and insight appear normal. Mood & affect appropriate.     Data Reviewed: I have personally reviewed following labs and imaging studies  CBC: Recent Labs  Lab 11/04/23 0436  WBC 4.6  HGB 12.3*  HCT 37.9*  MCV 90.7  PLT 301   Basic Metabolic Panel: Recent Labs  Lab 11/04/23 0436  NA 138  K 4.1  CL 102  CO2 23  GLUCOSE 96  BUN 26*  CREATININE 1.12  CALCIUM 9.1    GFR: Estimated Creatinine Clearance: 50.4 mL/min (by C-G formula based on SCr of 1.12 mg/dL). Liver Function Tests: No results for input(s): AST, ALT, ALKPHOS, BILITOT, PROT, ALBUMIN in the last 168 hours. No results for input(s): LIPASE, AMYLASE in the last 168 hours. No results for input(s): AMMONIA in the last 168 hours. Coagulation Profile: No results for input(s): INR, PROTIME in the last 168 hours. Cardiac Enzymes: No results for input(s): CKTOTAL, CKMB, CKMBINDEX, TROPONINI in the last 168 hours. BNP (last 3 results) No results for input(s): PROBNP in the last 8760 hours. HbA1C: No results for input(s): HGBA1C in the last 72 hours. CBG: Recent Labs  Lab 11/01/23 0759 11/01/23 1210 11/01/23 1637 11/02/23 0753 11/04/23 1650  GLUCAP 109* 109* 128* 89 102*   Lipid Profile: No results for input(s): CHOL, HDL, LDLCALC, TRIG, CHOLHDL, LDLDIRECT in the last 72 hours. Thyroid Function Tests: No results for input(s): TSH, T4TOTAL, FREET4, T3FREE,  THYROIDAB in the last 72 hours. Anemia Panel: No results for input(s): VITAMINB12, FOLATE, FERRITIN, TIBC, IRON, RETICCTPCT in the last 72 hours. Sepsis Labs: No results for input(s): PROCALCITON, LATICACIDVEN in the last 168 hours.  No results found for this or any previous visit (from the past 240 hours).       Radiology Studies: No results found.      Scheduled Meds:  amLODipine   10 mg Oral Daily   folic acid  1 mg Oral Daily   losartan  100 mg Oral Daily   multivitamin with minerals  1 tablet Oral Daily   thiamine  100 mg Oral Daily   Or   thiamine  100 mg Intravenous Daily   traZODone   75 mg Oral QHS   Continuous Infusions:   LOS: 14 days       Calvin KATHEE Robson, MD [facility]   If 7PM-7AM, please contact night-coverage  11/07/2023, 11:24 AM

## 2023-11-07 NOTE — Progress Notes (Signed)
 Physical Therapy Treatment Patient Details Name: Parker Brown MRN: [id number] DOB: 11-Sep-1955 Today's Date: 11/07/2023   History of Present Illness Patient is a 68 year old male with fall, acute left basal ganglia ICH, hypertensive emergency. Repeat head CT stable. PMH: hypertension and polysubstance abuse    PT Comments  Pt was I'ly giving himself a bath while seated in recliner upon arrival. He is is A and O but remains impulsive at times. Overall demonstrated much improved safety throughout session with less overall assistance required. RUE walker splint applied with pt educated on how he can independently play hand and strap without assistance. He stood from recliner with min assist + moderate vcs for improved fwd wt shift and hand placement. Much improved RLE step quality versus last gait attempts with RW. He will continue to benefit from skilled PT at DC to maximize his independence and safety with all ADLs. AIR is most appropriate DC rec to assist pt to PLOF.       If plan is discharge home, recommend the following: A lot of help with walking and/or transfers;A lot of help with bathing/dressing/bathroom;Assistance with cooking/housework;Direct supervision/assist for medications management;Direct supervision/assist for financial management;Assist for transportation     Equipment Recommendations  Other (comment) (Defer to next level of care)       Precautions / Restrictions Precautions Precautions: Fall Recall of Precautions/Restrictions: Impaired Restrictions Weight Bearing Restrictions Per Provider Order: No     Mobility  Bed Mobility General bed mobility comments: Pt was seated in recliner pre/post session  Transfers Overall transfer level: Needs assistance Equipment used: Rolling walker (2 wheels) (RUE walkersplint) Transfers: Sit to/from Stand Sit to Stand: Min assist  General transfer comment: Alot of vcs for how to place RUE in walkersplint and use strap to  hold in place. Vcing for fwd wt shift and hand placement however only min assist to safely stand 4 x throughout session from recliner    Ambulation/Gait Ambulation/Gait assistance: +2 safety/equipment, Min assist, Mod assist Gait Distance (Feet): 60 Feet Assistive device: Rolling walker (2 wheels) Gait Pattern/deviations: Step-to pattern, Decreased step length - right, Knees buckling, Decreased weight shift to left, Decreased stride length, Ataxic, Narrow base of support Gait velocity: decreased  General Gait Details: Pt demonstrated much improved safety with during ambulation today with less overall assistance. improved step quality with RLE advancement however still have ataxic/scissoring/ adduction when progressing RLE. requires only physical assistance for lateral wt shift L with gait trials. 1 x 60, 1 x 50 with seated rest between trials    Balance Overall balance assessment: Needs assistance Sitting-balance support: Feet supported Sitting balance-Leahy Scale: Good     Standing balance support: Bilateral upper extremity supported, During functional activity, Reliant on assistive device for balance Standing balance-Leahy Scale: Fair       Hotel Manager: No apparent difficulties  Cognition Arousal: Alert Behavior During Therapy: WFL for tasks assessed/performed, Impulsive   PT - Cognitive impairments: Safety/Judgement      PT - Cognition Comments: Pt was I'ly talking abth while seated in recliner upon arrival. He agrees to session and is oriented x 4 today. Pt's daughter brought his personal DME (Bilateral LE knee braces  donjoy LLE OA knee brace and RLE neo. hindge brace) Following commands: Intact Following commands impaired: Follows one step commands with increased time, Follows multi-step commands with increased time    Cueing Cueing Techniques: Verbal cues         Pertinent Vitals/Pain Pain Assessment Pain Assessment: 0-10 Pain  Score: 3   Pain Location: Bilateral knee Pain Descriptors / Indicators: Discomfort Pain Intervention(s): Limited activity within patient's tolerance, Monitored during session, Premedicated before session, Repositioned     PT Goals (current goals can now be found in the care plan section) Acute Rehab PT Goals Patient Stated Goal: get home to by dog roof Progress towards PT goals: Progressing toward goals    Frequency    Min 3X/week           Co-evaluation     PT goals addressed during session: Mobility/safety with mobility;Balance;Proper use of DME;Strengthening/ROM        AM-PAC PT 6 Clicks Mobility   Outcome Measure  Help needed turning from your back to your side while in a flat bed without using bedrails?: A Lot Help needed moving from lying on your back to sitting on the side of a flat bed without using bedrails?: A Lot Help needed moving to and from a bed to a chair (including a wheelchair)?: A Lot Help needed standing up from a chair using your arms (e.g., wheelchair or bedside chair)?: A Lot Help needed to walk in hospital room?: A Lot Help needed climbing 3-5 steps with a railing? : A Lot 6 Click Score: 12    End of Session Equipment Utilized During Treatment: Gait belt Activity Tolerance: Patient tolerated treatment well Patient left: in chair;with call bell/phone within reach;with chair alarm set Nurse Communication: Mobility status PT Visit Diagnosis: Hemiplegia and hemiparesis;Other abnormalities of gait and mobility (R26.89) Hemiplegia - Right/Left: Right Hemiplegia - dominant/non-dominant: Dominant Hemiplegia - caused by: Nontraumatic intracerebral hemorrhage     Time: 8879-8862 PT Time Calculation (min) (ACUTE ONLY): 17 min  Charges:    $Gait Training: 8-22 mins PT General Charges $$ ACUTE PT VISIT: 1 Visit                    Rankin Essex PTA 11/07/23, 12:40 PM

## 2023-11-08 DIAGNOSIS — I6389 Other cerebral infarction: Secondary | ICD-10-CM | POA: Diagnosis not present

## 2023-11-08 NOTE — Progress Notes (Signed)
 PROGRESS NOTE    Parker Brown  FMW:[id number] DOB: [birth year] DOA: 10/23/2023 PCP: Pcp, No    Brief Narrative:  68 y.o male with significant PMHx of COPD, polysubstance abuse (cocaine, marijuana, current everyday smoker), HTN, HLD, and arthritis who presented to the ED with chief complaints of right-sided weakness and a fall.   HPI: acute onset of right sided weakness which caused him to fall while walking to a neighbor's house. He denied hitting his head, LOC or any other associated symptoms. Patient admitted to smoking and using cocaine prior to presenting to the ED. On EMS arrival patient was noted with blood pressure of 204/104. 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 Headache reported in PM, CT head repeated 10/19: feels more weak in the right side, able to verbalize and follow commands. Repeat CT head ordered given NIH increased to 15. 10/20 persistent R hemiplegia. Weaned off cleviprex and ok to transfer to hospitalist. Plan placement for  inpatient rehab  10/21: EEG done pending read. If no concerns there can dc, if epileptiform can d/w neiro re: Rx but should not hold up dc  10/22.  EEG did not show any seizures but did show cortical dysfunction in the left hemisphere.  Patient had some agitation in the afternoon. 10/23.  Patient was able to move his right leg for me.  Patient has shoulder twitch right side. 10/24.  Patient able to straight leg raise with his right leg and flex in his right knee.  Patient able to lift right arm up off the bed but incoordinated with his hand and shoulder. 10/25.  Patient advised to continue doing exercises in the bed with straight leg raises and moving his arm. 10/26.  Patient able to extend with his right fingers and make a fist.  Patient  able to lift up his right arm but still a little uncoordinated.  Still no movement right ankle and foot but able to flex and extend at the knee and hip. 10/27.  Patient still not able to move right ankle and foot but able to straight leg raise and bend at the knee.  Little bit more movement out of his arm today 10/28.  Patient stated he walked with physical therapy today. 10/29: [facility] offered at [facility] 10/31: Pending insurance authorization for [facility]   Assessment & Plan:   Principal Problem:   Acute hemorrhagic infarction of brain [facility]) Active Problems:   Acute metabolic encephalopathy   Hypertensive emergency   Cocaine abuse (HCC)   Alcohol abuse   Intraparenchymal hemorrhage of brain (HCC)   Paralysis (HCC)   Weakness of right lower extremity   Weakness of right upper extremity  * Acute hemorrhagic infarction of brain [facility]) Neurology recommends no antiplatelet agents at this time.  Patient has right-sided weakness.  Patient's right side has improved during the hospital course.  Patient still with no movement of ankle and right foot.  Patient able to straight leg raise and flex at the knee.  Able to extend fingers very slowly and grip very slowly.  Able to bend at the elbow flex and extend.  Incoordination with the right shoulder.  Would benefit from continued treatment at a skilled nursing facility versus acute inpatient rehab as next level of care.  The patient is medically stable and motivated for improvement.  Plan: Patient remains medically stable and ready for discharge.  Patient will benefit from intensive inpatient rehab greater than 3 hours of therapy per day.  Currently pending insurance authorization for discharge to inpatient rehab.  TOC to update on authorization status   Acute metabolic encephalopathy Resolved.  Mental status improved.  Not agitated.  Adherent with recommended therapy   Hypertensive emergency Secondary to cocaine use.  Blood pressure improved.   Continue Norvasc  and Cozaar   Cocaine abuse (HCC) Advised on cessation   Alcohol abuse No evidence of withdrawal     DVT prophylaxis: SCD Code Status: Full Family Communication: Daughter at bedside 10/31 Disposition Plan: Status is: Inpatient Remains inpatient appropriate because: Unsafe discharge plan.  Need skilled nursing facility.   Level of care: Telemetry Medical  Consultants:  None  Procedures:  None  Antimicrobials: None   Subjective: Seen and examined.  Sitting up in chair.  No acute events overnight.  No new complaints.  Objective: Vitals:   11/07/23 1610 11/07/23 1921 11/08/23 0425 11/08/23 0500  BP: 120/80 137/85 133/70   Pulse: 85 86 81   Resp: 16 16    Temp: 97.9 F (36.6 C) 98.6 F (37 C) 98.1 F (36.7 C)   TempSrc: Oral Oral    SpO2: 99% 100% 100%   Weight:    59.4 kg  Height:        Intake/Output Summary (Last 24 hours) at 11/08/2023 1103 Last data filed at 11/08/2023 0242 Gross per 24 hour  Intake 240 ml  Output 950 ml  Net -710 ml   Filed Weights   11/04/23 0500 11/05/23 0500 11/08/23 0500  Weight: 56.5 kg 58.1 kg 59.4 kg    Examination:  General exam: NAD Respiratory system: Clear.  Normal breathing.  Room air Cardiovascular system: S1 S2, RRR, no murmurs, no pedal edema Gastrointestinal system: Thin, soft, NT/ND, normal bowel sounds Central nervous system: Alert.  Oriented x 3.  Decreased range of motion right lower extremity.  Weak grip strength on right Extremities: Symmetric 5 x 5 power. Skin: No rashes, lesions or ulcers Psychiatry: Judgement and insight appear normal. Mood & affect appropriate.     Data Reviewed: I have personally reviewed following labs and imaging studies  CBC: Recent Labs  Lab 11/04/23 0436  WBC 4.6  HGB 12.3*  HCT 37.9*  MCV 90.7  PLT 301   Basic Metabolic Panel: Recent Labs  Lab 11/04/23 0436  NA 138  K 4.1  CL 102  CO2 23  GLUCOSE 96  BUN 26*  CREATININE 1.12  CALCIUM 9.1    GFR: Estimated Creatinine Clearance: 51.6 mL/min (by C-G formula based on SCr of 1.12 mg/dL). Liver Function Tests: No results for input(s): AST, ALT, ALKPHOS, BILITOT, PROT, ALBUMIN in the last 168 hours. No results for input(s): LIPASE, AMYLASE in the last 168 hours. No results for input(s): AMMONIA in the last 168 hours. Coagulation Profile: No results for input(s): INR, PROTIME in the last 168 hours. Cardiac Enzymes: No results for input(s): CKTOTAL, CKMB, CKMBINDEX, TROPONINI in the last 168 hours. BNP (last 3 results) No results for input(s): PROBNP in the last 8760 hours. HbA1C: No results for input(s): HGBA1C in the last 72 hours. CBG: Recent Labs  Lab 11/01/23 1210 11/01/23 1637 11/02/23 0753 11/04/23 1650  GLUCAP 109* 128* 89 102*   Lipid Profile: No results for input(s): CHOL, HDL, LDLCALC, TRIG, CHOLHDL, LDLDIRECT in the last 72 hours. Thyroid Function Tests: No results for input(s): TSH, T4TOTAL, FREET4, T3FREE,  THYROIDAB in the last 72 hours. Anemia Panel: No results for input(s): VITAMINB12, FOLATE, FERRITIN, TIBC, IRON, RETICCTPCT in the last 72 hours. Sepsis Labs: No results for input(s): PROCALCITON, LATICACIDVEN in the last 168 hours.  No results found for this or any previous visit (from the past 240 hours).       Radiology Studies: No results found.      Scheduled Meds:  amLODipine   10 mg Oral Daily   folic acid  1 mg Oral Daily   losartan  100 mg Oral Daily   multivitamin with minerals  1 tablet Oral Daily   thiamine  100 mg Oral Daily   Or   thiamine  100 mg Intravenous Daily   traZODone   75 mg Oral QHS   Continuous Infusions:   LOS: 15 days       Calvin KATHEE Robson, MD [facility]   If 7PM-7AM, please contact night-coverage  11/08/2023, 11:03 AM

## 2023-11-08 NOTE — Plan of Care (Signed)
  Problem: Education: Goal: Knowledge of General Education information will improve Description: Including pain rating scale, medication(s)/side effects and non-pharmacologic comfort measures Outcome: Progressing   Problem: Clinical Measurements: Goal: Ability to maintain clinical measurements within normal limits will improve Outcome: Progressing Goal: Will remain free from infection Outcome: Progressing Goal: Respiratory complications will improve Outcome: Progressing Goal: Cardiovascular complication will be avoided Outcome: Progressing   Problem: Activity: Goal: Risk for activity intolerance will decrease Outcome: Progressing   Problem: Nutrition: Goal: Adequate nutrition will be maintained Outcome: Progressing   Problem: Coping: Goal: Level of anxiety will decrease Outcome: Progressing   Problem: Elimination: Goal: Will not experience complications related to bowel motility Outcome: Progressing Goal: Will not experience complications related to urinary retention Outcome: Progressing   Problem: Pain Managment: Goal: General experience of comfort will improve and/or be controlled 11/08/2023 0253 by Debby Catheryn Helling, RN Outcome: Progressing 11/08/2023 0251 by Debby Catheryn Helling, RN Outcome: Progressing   Problem: Safety: Goal: Ability to remain free from injury will improve 11/08/2023 0253 by Debby Catheryn Helling, RN Outcome: Progressing 11/08/2023 0251 by Debby Catheryn Helling, RN Outcome: Progressing   Problem: Skin Integrity: Goal: Risk for impaired skin integrity will decrease Outcome: Progressing

## 2023-11-08 NOTE — Plan of Care (Signed)

## 2023-11-08 NOTE — Plan of Care (Signed)
  Problem: Education: Goal: Knowledge of General Education information will improve Description: Including pain rating scale, medication(s)/side effects and non-pharmacologic comfort measures Outcome: Progressing   Problem: Clinical Measurements: Goal: Ability to maintain clinical measurements within normal limits will improve Outcome: Progressing Goal: Will remain free from infection Outcome: Progressing Goal: Respiratory complications will improve Outcome: Progressing Goal: Cardiovascular complication will be avoided Outcome: Progressing   Problem: Activity: Goal: Risk for activity intolerance will decrease Outcome: Progressing   Problem: Nutrition: Goal: Adequate nutrition will be maintained Outcome: Progressing   Problem: Coping: Goal: Level of anxiety will decrease Outcome: Progressing   Problem: Elimination: Goal: Will not experience complications related to bowel motility Outcome: Progressing Goal: Will not experience complications related to urinary retention Outcome: Progressing   Problem: Pain Managment: Goal: General experience of comfort will improve and/or be controlled Outcome: Progressing   Problem: Safety: Goal: Ability to remain free from injury will improve Outcome: Progressing   Problem: Skin Integrity: Goal: Risk for impaired skin integrity will decrease Outcome: Progressing   Problem: Education: Goal: Knowledge of disease or condition will improve Outcome: Not Applicable Previous stroke orders discontinued in [facility] Goal: Knowledge of secondary prevention will improve (MUST DOCUMENT ALL) Outcome: Not Applicable Goal: Knowledge of patient specific risk factors will improve (DELETE if not current risk factor) Outcome: Not Applicable   Problem: Intracerebral Hemorrhage Tissue Perfusion: Goal: Complications of Intracerebral Hemorrhage will be minimized Outcome: Not Applicable   Problem: Coping: Goal: Will verbalize positive feelings about  self Outcome: Not Applicable Goal: Will identify appropriate support needs Outcome: Not Applicable   Problem: Health Behavior/Discharge Planning: Goal: Ability to manage health-related needs will improve Outcome: Not Applicable Goal: Goals will be collaboratively established with patient/family Outcome: Not Applicable   Problem: Self-Care: Goal: Ability to participate in self-care as condition permits will improve Outcome: Not Applicable Goal: Verbalization of feelings and concerns over difficulty with self-care will improve Outcome: Not Applicable Goal: Ability to communicate needs accurately will improve Outcome: Not Applicable   Problem: Nutrition: Goal: Risk of aspiration will decrease Outcome: Not Applicable Goal: Dietary intake will improve Outcome: Not Applicable

## 2023-11-09 DIAGNOSIS — I6389 Other cerebral infarction: Secondary | ICD-10-CM | POA: Diagnosis not present

## 2023-11-09 DIAGNOSIS — Z515 Encounter for palliative care: Secondary | ICD-10-CM | POA: Diagnosis not present

## 2023-11-09 DIAGNOSIS — R29898 Other symptoms and signs involving the musculoskeletal system: Secondary | ICD-10-CM | POA: Diagnosis not present

## 2023-11-09 DIAGNOSIS — I619 Nontraumatic intracerebral hemorrhage, unspecified: Secondary | ICD-10-CM | POA: Diagnosis not present

## 2023-11-09 NOTE — Plan of Care (Signed)
 Pt vitals stable, no significant changes through the night. Problem: Clinical Measurements: Goal: Ability to maintain clinical measurements within normal limits will improve Outcome: Progressing   Problem: Activity: Goal: Risk for activity intolerance will decrease Outcome: Progressing   Problem: Coping: Goal: Level of anxiety will decrease Outcome: Progressing   Problem: Elimination: Goal: Will not experience complications related to bowel motility Outcome: Progressing   Problem: Pain Managment: Goal: General experience of comfort will improve and/or be controlled Outcome: Progressing   Problem: Ischemic Stroke/TIA Tissue Perfusion: Goal: Complications of ischemic stroke/TIA will be minimized Outcome: Progressing   Problem: Self-Care: Goal: Ability to participate in self-care as condition permits will improve Outcome: Progressing

## 2023-11-09 NOTE — Progress Notes (Signed)
 Palliative Care Progress Note, Assessment & Plan   Patient Name: Parker Brown       Date: 11/09/2023 DOB: 13-Apr-1955  Age: 68 y.o. MRN#: [id number] Attending Physician: Jhonny Calvin NOVAK, MD Primary Care Physician: Pcp, No Admit Date: 10/23/2023  Subjective: Patient is out of bed and sitting in recliner.  He has eaten his breakfast which is on the bedside table in front of him.  He is brushing his hair with a hair pick.  No family or friends present at bedside during my visit.  HPI: 68 y.o. male  with past medical history of COPD, HTN, HLD, current tobacco use, polysubstance abuse (cocaine, marijuana), PTSD, osteoarthritis with chronic low back pain, diverticulosis, carpal tunnel syndrome, and chronic hep C admitted on 10/23/2023 with right sided weakness.   As per chart review, patient experienced right sided weakness resulting in fall while walking to his neighbor's house.  He was found outside lying on the ground by his sister who subsequently called EMS.    Upon presentation to the ED, patient was hypertensive.  Additionally, RN noted patient was obviously intoxicated (Etoh negative) and endorsed striking his head with no loss of consciousness during fall.   Stat noncontrast CT obtained which revealed acute left basal ganglia intraparenchymal hemorrhage with mild edema and minimal mass effect.  Follow-up CTA of head and neck revealed no acute intracranial abnormalities or LVO.   Patient is being treated for intracranial hemorrhage with hemorrhagic stroke, hypertensive emergency, and cocaine abuse.   PMT was consulted to support patient and family with goals of care discussions.  Summary of counseling/coordination of care: Extensive chart review completed prior to meeting patient  including labs, vital signs, imaging, progress notes, orders, and available advanced directive documents from current and previous encounters.   After reviewing the patient's chart and assessing the patient at bedside, I spoke with patient in regards to symptom management and goals of care.   Symptoms assessed.  Patient says he feels well.  He shows me his bag and says that he is ready to leave.  He is all packed out, believing that he will discharge this evening.  I assured him that [facility] is following closely and will advise him when discharge is set.  No discharge order in place at this time.  We discussed patient's mobility and functional status.  He shares that his right and able to move much more than it did when he first came to the hospital.  He notes that his right thigh and leg are more mobile but that he is unable to flex and point his foot.  Education provided on foot drop.  He shares he is hopeful this will improve over time.  Again discussed boundaries and goals of care with patient.  He shares he would like to complete the paperwork to name his ex-wife to make decisions for him.  We again discussed advance directives.  He has completed his portion of the advance directive and it is at bedside.  I highlighted to patient that chaplain came bedside on 10/31 to complete paperwork but that patient advised chaplain that he did not need to complete it.  Patient apparently told chaplain that he had completed  paperwork at the [facility].  Patient shares he is now not sure that the paperwork he completed at the [facility] was notarized with 2 witnesses.  Education provided on advance directives and uses spiritual care to complete them.  He shares understanding and advised that he is ready to complete the paperwork now if possible.  Spiritual care consult again placed with detailed message outlining the patient is prepared to complete at at this time.  I secure chatted with TOC Zenobia to inquire about paperwork that  may or may not be present at the [facility].  Zenobia advised me that she is unable to obtain paperwork from the [facility].  No change to plan of care at this time.  Awaiting spiritual care to attempt completion of advance directive prior to discharge.  Physical Exam Vitals reviewed.  Constitutional:      General: He is not in acute distress.    Appearance: He is normal weight. He is not ill-appearing.  HENT:     Head: Normocephalic.     Mouth/Throat:     Mouth: Mucous membranes are moist.  Eyes:     Pupils: Pupils are equal, round, and reactive to light.  Pulmonary:     Effort: Pulmonary effort is normal.  Abdominal:     Palpations: Abdomen is soft.  Musculoskeletal:     Comments: RUE and RLE weakness  Skin:    General: Skin is warm and dry.  Neurological:     Mental Status: He is alert and oriented to person, place, and time.  Psychiatric:        Mood and Affect: Mood normal.        Behavior: Behavior normal.             50 minute visit includes: Detailed review of medical records (labs, imaging, vital signs), medically appropriate exam (mental status, respiratory, cardiac, skin), discussed with treatment team, counseling and educating patient, family and staff, documenting clinical information, medication management and coordination of care.  Lamarr L. Arvid, DNP, FNP-BC Palliative Medicine Team

## 2023-11-09 NOTE — Plan of Care (Signed)

## 2023-11-09 NOTE — TOC Progression Note (Addendum)
 Transition of Care [facility]) - Progression Note    Patient Details  Name: Parker Brown MRN: [id number] Date of Birth: Oct 08, 1955  Transition of Care [facility]) CM/SW Contact  Dalia GORMAN Fuse, RN Phone Number: 11/09/2023, 11:11 AM  Clinical Narrative:     Awaiting insurance auth for [facility]. TOC will continue to follow  11:07: TOC received message from [facility]. They received ins auth, but don't have a bed today. They will admit him tomorrow.     Barriers to Discharge: Continued Medical Work up               Expected Discharge Plan and Services       Living arrangements for the past 2 months: Single Family Home Expected Discharge Date: 10/31/23                                     Social Drivers of Health (SDOH) Interventions SDOH Screenings   Food Insecurity: No Food Insecurity (10/24/2023)  Housing: Low Risk  (10/24/2023)  Transportation Needs: No Transportation Needs (10/24/2023)  Utilities: Not At Risk (10/24/2023)  Social Connections: Socially Isolated (10/24/2023)  Tobacco Use: High Risk (10/23/2023)    Readmission Risk Interventions    10/27/2023   11:45 AM  Readmission Risk Prevention Plan  Transportation Screening Complete  PCP or Specialist Appt within 5-7 Days Complete  Medication Review (RN CM) Complete

## 2023-11-09 NOTE — Progress Notes (Signed)
 PROGRESS NOTE    Parker Brown  FMW:[id number] DOB: [birth year] DOA: 10/23/2023 PCP: Pcp, No    Brief Narrative:  68 y.o male with significant PMHx of COPD, polysubstance abuse (cocaine, marijuana, current everyday smoker), HTN, HLD, and arthritis who presented to the ED with chief complaints of right-sided weakness and a fall.   HPI: acute onset of right sided weakness which caused him to fall while walking to a neighbor's house. He denied hitting his head, LOC or any other associated symptoms. Patient admitted to smoking and using cocaine prior to presenting to the ED. On EMS arrival patient was noted with blood pressure of 204/104. 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 Headache reported in PM, CT head repeated 10/19: feels more weak in the right side, able to verbalize and follow commands. Repeat CT head ordered given NIH increased to 15. 10/20 persistent R hemiplegia. Weaned off cleviprex and ok to transfer to hospitalist. Plan placement for  inpatient rehab  10/21: EEG done pending read. If no concerns there can dc, if epileptiform can d/w neiro re: Rx but should not hold up dc  10/22.  EEG did not show any seizures but did show cortical dysfunction in the left hemisphere.  Patient had some agitation in the afternoon. 10/23.  Patient was able to move his right leg for me.  Patient has shoulder twitch right side. 10/24.  Patient able to straight leg raise with his right leg and flex in his right knee.  Patient able to lift right arm up off the bed but incoordinated with his hand and shoulder. 10/25.  Patient advised to continue doing exercises in the bed with straight leg raises and moving his arm. 10/26.  Patient able to extend with his right fingers and make a fist.  Patient  able to lift up his right arm but still a little uncoordinated.  Still no movement right ankle and foot but able to flex and extend at the knee and hip. 10/27.  Patient still not able to move right ankle and foot but able to straight leg raise and bend at the knee.  Little bit more movement out of his arm today 10/28.  Patient stated he walked with physical therapy today. 10/29: [facility] 10/31: Pending insurance authorization for [facility] 11/3: Insurance authorization obtained.  [facility] will have a bed available 11/4   Assessment & Plan:   Principal Problem:   Acute hemorrhagic infarction of brain [facility]) Active Problems:   Acute metabolic encephalopathy   Hypertensive emergency   Cocaine abuse (HCC)   Alcohol abuse   Intraparenchymal hemorrhage of brain (HCC)   Paralysis (HCC)   Weakness of right lower extremity   Weakness of right upper extremity  * Acute hemorrhagic infarction of brain [facility]) Neurology recommends no antiplatelet agents at this time.  Patient has right-sided weakness.  Patient's right side has improved during the hospital course.  Patient still with no movement of ankle and right foot.  Patient able to straight leg raise and flex at the knee.  Able to extend fingers very slowly and grip very slowly.  Able to bend at the elbow flex and extend.  Incoordination with the right shoulder.  Would benefit from continued treatment at a skilled nursing facility versus acute inpatient rehab as next level  of care.  The patient is medically stable and motivated for improvement.   Plan: Patient remains medically stable and ready for discharge.  Patient will benefit from intensive inpatient rehab greater than 3 hours of therapy per day.  We have insurance authorization.  [facility] stated they will have a bed for him tomorrow   Acute metabolic encephalopathy Resolved.  Mental status improved.  Not agitated.  Adherent with recommended therapy   Hypertensive  emergency Secondary to cocaine use.  Blood pressure improved.  Continue Norvasc  and Cozaar   Cocaine abuse (HCC) Advised on cessation   Alcohol abuse No evidence of withdrawal     DVT prophylaxis: SCD Code Status: Full Family Communication: Daughter at bedside 10/31 Disposition Plan: Status is: Inpatient Remains inpatient appropriate because: Unsafe discharge plan.  Need skilled nursing facility.   Level of care: Telemetry Medical  Consultants:  None  Procedures:  None  Antimicrobials: None   Subjective: And examined.  Sitting up in chair.  No acute events overnight.  No new complaints.  Objective: Vitals:   11/08/23 1948 11/09/23 0415 11/09/23 0500 11/09/23 0840  BP: 138/88 135/87  (!) 147/91  Pulse: 81 80  78  Resp: 18 18  20   Temp: 97.6 F (36.4 C) (!) 97.4 F (36.3 C)  (!) 97.5 F (36.4 C)  TempSrc:    Oral  SpO2: 99% 98%  100%  Weight:   59.4 kg   Height:        Intake/Output Summary (Last 24 hours) at 11/09/2023 1319 Last data filed at 11/09/2023 0700 Gross per 24 hour  Intake --  Output 925 ml  Net -925 ml   Filed Weights   11/05/23 0500 11/08/23 0500 11/09/23 0500  Weight: 58.1 kg 59.4 kg 59.4 kg    Examination:  General exam: NAD Respiratory system: Clear.  Normal breathing.  Room air Cardiovascular system: S1 S2, RRR, no murmurs, no pedal edema Gastrointestinal system: Thin, soft, NT/ND, normal bowel sounds Central nervous system: Alert.  Oriented x 3.  Decreased range of motion right lower extremity.  Weak grip strength on right Extremities: Symmetric 5 x 5 power. Skin: No rashes, lesions or ulcers Psychiatry: Judgement and insight appear normal. Mood & affect appropriate.     Data Reviewed: I have personally reviewed following labs and imaging studies  CBC: Recent Labs  Lab 11/04/23 0436  WBC 4.6  HGB 12.3*  HCT 37.9*  MCV 90.7  PLT 301   Basic Metabolic Panel: Recent Labs  Lab 11/04/23 0436  NA 138  K 4.1  CL 102   CO2 23  GLUCOSE 96  BUN 26*  CREATININE 1.12  CALCIUM 9.1   GFR: Estimated Creatinine Clearance: 51.6 mL/min (by C-G formula based on SCr of 1.12 mg/dL). Liver Function Tests: No results for input(s): AST, ALT, ALKPHOS, BILITOT, PROT, ALBUMIN in the last 168 hours. No results for input(s): LIPASE, AMYLASE in the last 168 hours. No results for input(s): AMMONIA in the last 168 hours. Coagulation Profile: No results for input(s): INR, PROTIME in the last 168 hours. Cardiac Enzymes: No results for input(s): CKTOTAL, CKMB, CKMBINDEX, TROPONINI in the last 168 hours. BNP (last 3 results) No results for input(s): PROBNP in the last 8760 hours. HbA1C: No results for input(s): HGBA1C in the last 72 hours. CBG: Recent Labs  Lab 11/04/23 1650  GLUCAP 102*   Lipid Profile: No results for input(s): CHOL, HDL, LDLCALC, TRIG, CHOLHDL, LDLDIRECT in the last 72 hours. Thyroid Function Tests: No results for  input(s): TSH, T4TOTAL, FREET4, T3FREE, THYROIDAB in the last 72 hours. Anemia Panel: No results for input(s): VITAMINB12, FOLATE, FERRITIN, TIBC, IRON, RETICCTPCT in the last 72 hours. Sepsis Labs: No results for input(s): PROCALCITON, LATICACIDVEN in the last 168 hours.  No results found for this or any previous visit (from the past 240 hours).       Radiology Studies: No results found.      Scheduled Meds:  amLODipine   10 mg Oral Daily   folic acid  1 mg Oral Daily   losartan  100 mg Oral Daily   multivitamin with minerals  1 tablet Oral Daily   thiamine  100 mg Oral Daily   Or   thiamine  100 mg Intravenous Daily   traZODone   75 mg Oral QHS   Continuous Infusions:   LOS: 16 days       Calvin KATHEE Robson, MD [facility]   If 7PM-7AM, please contact night-coverage  11/09/2023, 1:19 PM

## 2023-11-09 NOTE — Progress Notes (Signed)
 Occupational Therapy Treatment Patient Details Name: Parker Brown MRN: [id number] DOB: 10-07-1955 Today's Date: 11/09/2023   History of present illness Patient is a 68 year old male with fall, acute left basal ganglia ICH, hypertensive emergency. Repeat head CT stable. PMH: hypertension and polysubstance abuse   OT comments  Patient seen for OT treatment on this date. Upon arrival to room patient sitting in recliner, agreeable to treatment. While standing, patietn engaged in NDT weight bearing thru RUE/RLE using walker reaching in all planes with min A to maintain balance, no LOB noted.  Patient ended treatment in bed with bed/chair alarm on and all needs within reach. Patient making good progress toward goals, will continue to follow POC. Discharge recommendation remains appropriate. Plan to dc to CIR tomorrow.       If plan is discharge home, recommend the following:  Two people to help with walking and/or transfers;Two people to help with bathing/dressing/bathroom;Assistance with cooking/housework;Assistance with feeding;Direct supervision/assist for medications management;Assist for transportation;Direct supervision/assist for financial management;Help with stairs or ramp for entrance;Supervision due to cognitive status   Equipment Recommendations  Other (comment) (defer to next venue of care)    Recommendations for Other Services      Precautions / Restrictions Precautions Precautions: Fall Recall of Precautions/Restrictions: Impaired Precaution/Restrictions Comments: SBP< 150 Restrictions Weight Bearing Restrictions Per Provider Order: No       Mobility Bed Mobility Overal bed mobility: Needs Assistance Bed Mobility: Sit to Supine       Sit to supine: Supervision        Transfers Overall transfer level: Needs assistance Equipment used: Rolling walker (2 wheels) Transfers: Sit to/from Stand Sit to Stand: Min assist           General transfer  comment: cues for hand placement with sit<>stand     Balance Overall balance assessment: Needs assistance Sitting-balance support: Feet supported Sitting balance-Leahy Scale: Good Sitting balance - Comments: no LOB while seated EOB. pt I'ly applied L shoe but requires assistance to apply R shoe Postural control: Posterior lean, Left lateral lean Standing balance support: Bilateral upper extremity supported, During functional activity, Reliant on assistive device for balance Standing balance-Leahy Scale: Fair Standing balance comment: Pt remains at high risk of falls. required assist several occasions to prevent lOB/fall during gait training. w/c follow for additional safety                           ADL either performed or assessed with clinical judgement   ADL                                              Extremity/Trunk Assessment              Vision       Perception     Praxis     Communication Communication Communication: No apparent difficulties Factors Affecting Communication: Reduced clarity of speech   Cognition Arousal: Alert Behavior During Therapy: WFL for tasks assessed/performed, Impulsive Cognition: Cognition impaired     Awareness: Online awareness impaired                         Following commands: Intact Following commands impaired: Follows one step commands with increased time, Follows multi-step commands with increased time      Cueing  Cueing Techniques: Verbal cues  Exercises Exercises: Other exercises Other Exercises Other Exercises: weight shifting in standing x 10 reps to R side, x 10 reps to L side, crossing midline x 10 reps, reaching up/below knees x 10 reps, reaching forward x 10 reps; 2 seated restb reaks    Shoulder Instructions       General Comments      Pertinent Vitals/ Pain       Pain Assessment Pain Assessment: No/denies pain  Home Living                                           Prior Functioning/Environment              Frequency  Min 2X/week        Progress Toward Goals  OT Goals(current goals can now be found in the care plan section)  Progress towards OT goals: Progressing toward goals  Acute Rehab OT Goals Patient Stated Goal: to go to rehab OT Goal Formulation: With patient Time For Goal Achievement: 11/09/23 Potential to Achieve Goals: Fair ADL Goals Pt Will Perform Grooming: with min assist;sitting Pt Will Perform Lower Body Dressing: with min assist;sitting/lateral leans Pt Will Transfer to Toilet: with min assist;stand pivot transfer Pt Will Perform Toileting - Clothing Manipulation and hygiene: with min assist;sit to/from stand  Plan      Co-evaluation                 AM-PAC OT 6 Clicks Daily Activity     Outcome Measure   Help from another person eating meals?: A Little Help from another person taking care of personal grooming?: A Little Help from another person toileting, which includes using toliet, bedpan, or urinal?: A Lot Help from another person bathing (including washing, rinsing, drying)?: A Lot Help from another person to put on and taking off regular upper body clothing?: A Lot Help from another person to put on and taking off regular lower body clothing?: A Lot 6 Click Score: 14    End of Session Equipment Utilized During Treatment: Gait belt;Rolling walker (2 wheels)  OT Visit Diagnosis: Unsteadiness on feet (R26.81);Repeated falls (R29.6);Muscle weakness (generalized) (M62.81)   Activity Tolerance Patient tolerated treatment well   Patient Left in bed;with call bell/phone within reach;with bed alarm set   Nurse Communication          Time: 8472-8451 OT Time Calculation (min): 21 min  Charges: OT General Charges $OT Visit: 1 Visit OT Treatments $Neuromuscular Re-education: 8-22 mins  Rogers Clause, OT/L MSOT, 11/09/2023

## 2023-11-09 NOTE — Progress Notes (Signed)
 Physical Therapy Treatment Patient Details Name: Parker Brown MRN: [id number] DOB: 25-Apr-1955 Today's Date: 11/09/2023   History of Present Illness Patient is a 68 year old male with fall, acute left basal ganglia ICH, hypertensive emergency. Repeat head CT stable. PMH: hypertension and polysubstance abuse    PT Comments  Pt was long sitting in bed upon arrival. He is A and O x 4 but still has impulsivity at times. He remains cooperative and motivated. No physical assistance to progress to short sitting EOB however once EOB, requires assistance to safely stand and tolerate ambulate with RW. Pt used walker splint + Ace wrapped R ankle/foot. Will benefit from AFO for additional knee/ankle stability. Acute PT will continue to follow and progress per current POC.    If plan is discharge home, recommend the following: A lot of help with walking and/or transfers;A lot of help with bathing/dressing/bathroom;Assistance with cooking/housework;Direct supervision/assist for medications management;Direct supervision/assist for financial management;Assist for transportation     Equipment Recommendations  Other (comment) (Defer to next level of care)       Precautions / Restrictions Precautions Precautions: Fall Recall of Precautions/Restrictions: Impaired Precaution/Restrictions Comments: SBP< 150 Restrictions Weight Bearing Restrictions Per Provider Order: No     Mobility  Bed Mobility Overal bed mobility: Needs Assistance Bed Mobility: Supine to Sit Rolling: Supervision Supine to sit: Supervision, Contact guard  General bed mobility comments: Slightly impulsive with transition form supine to EOB sitting    Transfers Overall transfer level: Needs assistance Equipment used: Rolling walker (2 wheels) (RUE walker splint + ACE wrapped R ankle foot due to instability. Will benefit from AFO in near future) Transfers: Sit to/from Stand Sit to Stand: Min assist  General transfer  comment: Pt continues to require assistance to safely stand form EOB and w/c surfaces. Vcs for L later/fwd wt shift and overall improve handplacement. pt was able to place RUE in walker splint and use strap to hold in place throughout all standing activity    Ambulation/Gait Ambulation/Gait assistance: Min assist, Mod assist Gait Distance (Feet): 30 Feet Assistive device: Rolling walker (2 wheels) Gait Pattern/deviations: Step-to pattern, Decreased step length - right, Decreased weight shift to left, Decreased stride length, Ataxic, Narrow base of support Gait velocity: decreased  General Gait Details: Pt was able to ambulate ~ 30 ft then 1 x 12 ft with RW + walker splint + ACW wrap on R ankle/foot for assist with DF/ankle instability. Will benefit form trial of AFO next session    Balance Overall balance assessment: Needs assistance Sitting-balance support: Feet supported Sitting balance-Leahy Scale: Good     Standing balance support: Bilateral upper extremity supported, During functional activity, Reliant on assistive device for balance Standing balance-Leahy Scale: Fair Standing balance comment: Pt remains at high risk of falls. required assist several occasions to prevent lOB/fall during gait training. w/c follow for additional safety       Communication Communication Communication: No apparent difficulties  Cognition Arousal: Alert Behavior During Therapy: WFL for tasks assessed/performed, Impulsive   PT - Cognitive impairments: Safety/Judgement, Awareness, Problem solving, Orientation    PT - Cognition Comments: Pt remains slightly impulsive but overall remains A and O and motivated. Does endorse feeling impatient about gett9ing out of acute hospital and going to rehab Following commands: Intact Following commands impaired: Follows one step commands with increased time, Follows multi-step commands with increased time    Cueing Cueing Techniques: Verbal cues          Pertinent Vitals/Pain Pain Assessment  Pain Assessment: 0-10 Pain Score: 4  Pain Location: Bilateral knee Pain Descriptors / Indicators: Discomfort Pain Intervention(s): Limited activity within patient's tolerance, Monitored during session, Premedicated before session, Repositioned     PT Goals (current goals can now be found in the care plan section) Acute Rehab PT Goals Patient Stated Goal: Get better so I can return to my spouse and dog Progress towards PT goals: Progressing toward goals    Frequency    Min 3X/week       Co-evaluation     PT goals addressed during session: Mobility/safety with mobility;Balance;Strengthening/ROM;Proper use of DME        AM-PAC PT 6 Clicks Mobility   Outcome Measure  Help needed turning from your back to your side while in a flat bed without using bedrails?: A Little Help needed moving from lying on your back to sitting on the side of a flat bed without using bedrails?: A Little Help needed moving to and from a bed to a chair (including a wheelchair)?: A Little Help needed standing up from a chair using your arms (e.g., wheelchair or bedside chair)?: A Lot Help needed to walk in hospital room?: A Lot Help needed climbing 3-5 steps with a railing? : A Lot 6 Click Score: 15    End of Session Equipment Utilized During Treatment: Gait belt (walker splint + acewrapped RLE (ankle + foot)) Activity Tolerance: Patient tolerated treatment well Patient left: in chair;with call bell/phone within reach;with chair alarm set Nurse Communication: Mobility status PT Visit Diagnosis: Hemiplegia and hemiparesis;Other abnormalities of gait and mobility (R26.89) Hemiplegia - Right/Left: Right Hemiplegia - dominant/non-dominant: Dominant Hemiplegia - caused by: Nontraumatic intracerebral hemorrhage     Time: 8955-8941 PT Time Calculation (min) (ACUTE ONLY): 14 min  Charges:    $Neuromuscular Re-education: 8-22 mins PT General Charges $$  ACUTE PT VISIT: 1 Visit                     Rankin Essex PTA 11/09/23, 11:39 AM

## 2023-11-09 NOTE — Progress Notes (Signed)
 Patient requesting AD completion today. Chaplain unable to secure notary after several calls, messages and visits to floors and administration. Patient was made aware of the notary scarcity and that chaplain would do everything possible to get doc done.   Aneesh Faller Chaplain Intern-[facility]

## 2023-11-10 DIAGNOSIS — I6389 Other cerebral infarction: Secondary | ICD-10-CM | POA: Diagnosis not present

## 2023-11-10 MED ORDER — DOCUSATE SODIUM 100 MG PO CAPS: 100.0000 mg | ORAL_CAPSULE | Freq: Two times a day (BID) | ORAL | Status: AC | PRN

## 2023-11-10 MED ORDER — VITAMIN B-1 100 MG PO TABS: 100.0000 mg | ORAL_TABLET | Freq: Every day | ORAL | Status: AC

## 2023-11-10 MED ORDER — FOLIC ACID 1 MG PO TABS: 1.0000 mg | ORAL_TABLET | Freq: Every day | ORAL | Status: AC

## 2023-11-10 MED ORDER — LOSARTAN POTASSIUM 100 MG PO TABS: 100.0000 mg | ORAL_TABLET | Freq: Every day | ORAL | Status: AC

## 2023-11-10 NOTE — Progress Notes (Signed)
 Discharge report called to [employer] to [employer]. Awaiting pickup.

## 2023-11-10 NOTE — Plan of Care (Signed)
  Problem: Clinical Measurements: Goal: Ability to maintain clinical measurements within normal limits will improve Outcome: Progressing   Problem: Elimination: Goal: Will not experience complications related to bowel motility Outcome: Progressing   Problem: Pain Managment: Goal: General experience of comfort will improve and/or be controlled Outcome: Progressing   Problem: Safety: Goal: Ability to remain free from injury will improve Outcome: Progressing   Problem: Coping: Goal: Level of anxiety will decrease Outcome: Progressing

## 2023-11-10 NOTE — TOC Transition Note (Signed)
 Transition of Care [facility]) - Discharge Note   Patient Details  Name: Parker Brown MRN: [id number] Date of Birth: 1955-08-17  [facility] CM/SW Contact:  Victory Jackquline RAMAN, RN Phone Number: 11/10/2023, 11:26 AM   Clinical Narrative:   Pt discharging to [facility]. Nurse to call report to [phone]. RNCM  Notified [name] at [phone].  Transport set up with [facility]. There are 3 people ahead of him. No further concerns. RNCM Signing off.     Final next level of care: Skilled Nursing Facility Barriers to Discharge: Barriers Resolved   Patient Goals and CMS Choice            Discharge Placement                Patient to be transferred to facility by: [facility] Name of family member notified: Ernistine Patient and family notified of of transfer: 11/10/23  Discharge Plan and Services Additional resources added to the After Visit Summary for                                       Social Drivers of Health (SDOH) Interventions SDOH Screenings   Food Insecurity: No Food Insecurity (10/24/2023)  Housing: Low Risk  (10/24/2023)  Transportation Needs: No Transportation Needs (10/24/2023)  Utilities: Not At Risk (10/24/2023)  Social Connections: Socially Isolated (10/24/2023)  Tobacco Use: High Risk (10/23/2023)     Readmission Risk Interventions    10/27/2023   11:45 AM  Readmission Risk Prevention Plan  Transportation Screening Complete  PCP or Specialist Appt within 5-7 Days Complete  Medication Review (RN CM) Complete

## 2023-11-10 NOTE — Progress Notes (Signed)
 Occupational Therapy Treatment Patient Details Name: Parker Brown MRN: [id number] DOB: [birth year] Today's Date: 11/10/2023   History of present illness Patient is a 68 year old male with fall, acute left basal ganglia ICH, hypertensive emergency. Repeat head CT stable. PMH: hypertension and polysubstance abuse   OT comments  Pt seen for OT treatment session this date. Pt impulsive, tangential and verbose, requiring frequent redirection to task. RUE noted to have 1.5 finger width subluxation, attempted to educate pt on positioning with pt quickly dismissing I know all that already. Pt requires supervision for bed mobility, MIN A for functional STS transfers and requires up to MIN A for standing dynamic balance tasks with LUE support on RW to perform functional reach with hemiplegic RUE. Pt demonstrates ability to perform shoulder flexion to 80-90 deg, and emerging functional grasp/release pattern which improves with use of vision to increase accuracy of release. Pt hopeful to discharge today to [facility]. OT will continue to follow acutely.        If plan is discharge home, recommend the following:  Two people to help with walking and/or transfers;Two people to help with bathing/dressing/bathroom;Assistance with cooking/housework;Assistance with feeding;Direct supervision/assist for medications management;Assist for transportation;Direct supervision/assist for financial management;Help with stairs or ramp for entrance;Supervision due to cognitive status   Equipment Recommendations  Other (comment)    Recommendations for Other Services Rehab consult    Precautions / Restrictions Precautions Precautions: Fall Recall of Precautions/Restrictions: Impaired Precaution/Restrictions Comments: SBP< 150 Restrictions Weight Bearing Restrictions Per Provider Order: No       Mobility Bed Mobility Overal bed mobility: Needs Assistance Bed Mobility: Sit to Supine     Supine to sit:  Supervision Sit to supine: Supervision   General bed mobility comments: impulsive    Transfers Overall transfer level: Needs assistance Equipment used: Rolling walker (2 wheels) Transfers: Sit to/from Stand Sit to Stand: Min assist           General transfer comment: impulsive     Balance Overall balance assessment: Needs assistance Sitting-balance support: Feet supported Sitting balance-Leahy Scale: Good     Standing balance support: During functional activity, Reliant on assistive device for balance, Single extremity supported Standing balance-Leahy Scale: Fair Standing balance comment: able to maintain balance with LUE supported on RW, minA to maintain upright during dynamic reaching tasks                           ADL either performed or assessed with clinical judgement   ADL Overall ADL's : Needs assistance/impaired                                            Extremity/Trunk Assessment Upper Extremity Assessment RUE Deficits / Details: pt has functional reach to 90 degrees, poor functional grasp release pattern, 1.5 finger width sublux RUE: Subluxation noted                     Communication Communication Communication: Impaired (verbose & tangetial) Factors Affecting Communication: Reduced clarity of speech   Cognition Arousal: Alert Behavior During Therapy: WFL for tasks assessed/performed, Impulsive Cognition: Cognition impaired   Orientation impairments: Situation, Time Awareness: Online awareness impaired Memory impairment (select all impairments): Short-term memory Attention impairment (select first level of impairment): Focused attention Executive functioning impairment (select all impairments): Initiation, Organization, Sequencing, Reasoning, Problem solving  OT - Cognition Comments: tangetial, lacks insight into deficits, difficult to direct with pt verbose and self-led throughout                 Following  commands: Impaired (pt is verbose, and does not listen) Following commands impaired: Follows one step commands with increased time, Follows multi-step commands with increased time      Cueing   Cueing Techniques: Verbal cues  Exercises Exercises: Other exercises Other Exercises Other Exercises: functional reach with dynamic standing balance. unilateral LUE support on RW, MIN A at trunk for balance. pt able to perform grasp and release with hemiparetic RUE, difficulties with weight shifting, cues to maintain attn as pt verbose. pt is able to flex shld to 80 degrees, impaired GMC/FMC, but does demo emerging functional grasp with shampoo bottle, reaching across midine to release bottle on tray table            Pertinent Vitals/ Pain       Pain Assessment Pain Assessment: No/denies pain Pain Score: 0-No pain   Frequency  Min 2X/week        Progress Toward Goals  OT Goals(current goals can now be found in the care plan section)  Progress towards OT goals: Progressing toward goals  Acute Rehab OT Goals OT Goal Formulation: With patient Time For Goal Achievement: 11/24/23 Potential to Achieve Goals: Fair ADL Goals Pt Will Perform Grooming: with min assist;sitting Pt Will Perform Lower Body Dressing: with min assist;sitting/lateral leans Pt Will Transfer to Toilet: with min assist;stand pivot transfer Pt Will Perform Toileting - Clothing Manipulation and hygiene: with min assist;sit to/from stand  Plan         AM-PAC OT 6 Clicks Daily Activity     Outcome Measure   Help from another person eating meals?: A Little Help from another person taking care of personal grooming?: A Little Help from another person toileting, which includes using toliet, bedpan, or urinal?: A Lot Help from another person bathing (including washing, rinsing, drying)?: A Lot Help from another person to put on and taking off regular upper body clothing?: A Lot Help from another person to put on and  taking off regular lower body clothing?: A Lot 6 Click Score: 14    End of Session Equipment Utilized During Treatment: Gait belt;Rolling walker (2 wheels)  OT Visit Diagnosis: Unsteadiness on feet (R26.81);Repeated falls (R29.6);Muscle weakness (generalized) (M62.81);Other symptoms and signs involving the nervous system (R29.898);Hemiplegia and hemiparesis Hemiplegia - Right/Left: Right Hemiplegia - dominant/non-dominant: Dominant Hemiplegia - caused by: Nontraumatic intracerebral hemorrhage   Activity Tolerance Patient tolerated treatment well   Patient Left in bed;with call bell/phone within reach;with bed alarm set   Nurse Communication Mobility status        Time: 0955-1006 OT Time Calculation (min): 11 min  Charges: OT General Charges $OT Visit: 1 Visit OT Treatments $Neuromuscular Re-education: 8-22 mins  Kayleena Eke L. Yashas Camilli, OTR/L  11/10/23, 1:00 PM

## 2023-11-10 NOTE — Discharge Summary (Signed)
 Physician Discharge Summary  Parker Brown:[id number] DOB: April 19, 1955 DOA: 10/23/2023  PCP: Pcp, No  Admit date: 10/23/2023 Discharge date: 11/10/2023  Admitted From: Home Disposition:  [facility]  Recommendations for Outpatient Follow-up:  Follow up with PCP in 1-2 weeks   Home Health:No  Equipment/Devices:None   Discharge Condition:Stable  CODE STATUS:FULL  Diet recommendation: Heart   Brief/Interim Summary:  68 y.o male with significant PMHx of COPD, polysubstance abuse (cocaine, marijuana, current everyday smoker), HTN, HLD, and arthritis who presented to the ED with chief complaints of right-sided weakness and a fall.   HPI: acute onset of right sided weakness which caused him to fall while walking to a neighbor's house. He denied hitting his head, LOC or any other associated symptoms. Patient admitted to smoking and using cocaine prior to presenting to the ED. On EMS arrival patient was noted with blood pressure of 204/104. 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 Headache reported in PM, CT head repeated 10/19: feels more weak in the right side, able to verbalize and follow commands. Repeat CT head ordered given NIH increased to 15. 10/20 persistent R hemiplegia. Weaned off cleviprex and ok to transfer to hospitalist. Plan placement for  inpatient rehab  10/21: EEG done pending read. If no concerns there can dc, if epileptiform can d/w neiro re: Rx but should not hold up dc  10/22.  EEG did not show any seizures but did show cortical dysfunction in the left hemisphere.  Patient had some agitation in the afternoon. 10/23.  Patient was able to move his right leg for me.  Patient has shoulder twitch right side. 10/24.  Patient able to straight leg raise with his right leg  and flex in his right knee.  Patient able to lift right arm up off the bed but incoordinated with his hand and shoulder. 10/25.  Patient advised to continue doing exercises in the bed with straight leg raises and moving his arm. 10/26.  Patient able to extend with his right fingers and make a fist.  Patient able to lift up his right arm but still a little uncoordinated.  Still no movement right ankle and foot but able to flex and extend at the knee and hip. 10/27.  Patient still not able to move right ankle and foot but able to straight leg raise and bend at the knee.  Little bit more movement out of his arm today 10/28.  Patient stated he walked with physical therapy today. 10/29: Bed offered at [facility] 10/31: Pending insurance authorization for [facility] 11/3: Insurance authorization obtained.  [facility] will have a bed available 11/4      Discharge Diagnoses:  Principal Problem:   Acute hemorrhagic infarction of brain [facility]) Active Problems:   Acute metabolic encephalopathy   Hypertensive emergency   Cocaine abuse (HCC)   Alcohol abuse   Intraparenchymal hemorrhage of brain (HCC)   Paralysis (HCC)   Weakness of right lower extremity   Weakness of right upper extremity   * Acute hemorrhagic infarction of brain [facility]) Neurology recommends no antiplatelet agents at this time.  Patient has right-sided weakness.  Patient's right side has improved during the hospital course.  Patient still with no movement of ankle and right foot.  Patient able to straight leg raise and flex at the knee.  Able to extend fingers very slowly and grip very slowly.  Able to bend at the elbow flex and extend.  Incoordination with the right shoulder.  Would benefit from continued treatment at a skilled nursing facility versus acute inpatient rehab as next level of care.  The patient is medically stable and motivated for improvement.   Plan: Patient remains medically stable and ready for discharge.  Patient will  benefit from intensive inpatient rehab greater than 3 hours of therapy per day.  We have insurance authorization.  Discharge to [facility].  Ambulatory referral to neurology for hospital follow-up.  8 to 12 weeks.  Considering hemorrhagic infarct will not initiate antiplatelet therapy at this time.   Acute metabolic encephalopathy Resolved.  Mental status improved.  Not agitated.  Adherent with recommended therapy   Hypertensive emergency Secondary to cocaine use.  Blood pressure improved.  Continue Norvasc  and Cozaar Home metoprolol  has been held in setting of known cocaine use.  Blood pressure and heart rate have been stable on Norvasc  and Cozaar.  Can consider reintroduction of beta-blockade at a later date   Cocaine abuse (HCC) Advised on cessation   Alcohol abuse No evidence of withdrawal  Discharge Instructions  Discharge Instructions     Diet - low sodium heart healthy   Complete by: As directed    Increase activity slowly   Complete by: As directed       Allergies as of 11/10/2023       Reactions   Benzoyl Peroxide Anaphylaxis   Fish Oil Anaphylaxis   Methocarbamol Anaphylaxis   Pineapple Anaphylaxis   Carbomer    Tuberculin, Ppd    Other Reaction(s): Eruption of skin   Tuberculin Purified Protein Derivative Hives, Rash        Medication List     STOP taking these medications    metoprolol  tartrate 25 MG tablet Commonly known as: LOPRESSOR        TAKE these medications    acetaminophen  325 MG tablet Commonly known as: TYLENOL  Take 2 tablets (650 mg total) by mouth every 6 (six) hours as needed for mild pain (pain score 1-3), fever or headache.   albuterol  108 (90 Base) MCG/ACT inhaler Commonly known as: VENTOLIN  HFA Inhale 2 puffs into the lungs every 6 (six) hours as needed for wheezing or shortness of breath.   amLODipine  10 MG tablet Commonly known as: NORVASC  Take 1 tablet (10 mg total) by mouth daily. Hold if SBP <130   docusate sodium   100 MG capsule Commonly known as: COLACE Take 1 capsule (100 mg total) by mouth 2 (two) times daily as needed for mild constipation.   folic acid 1 MG tablet Commonly known as: FOLVITE Take 1 tablet (1 mg total) by mouth daily.   losartan 100 MG tablet Commonly known as: COZAAR Take 1 tablet (100 mg total) by mouth daily.   polyethylene glycol 17 g packet Commonly known as: MIRALAX  / GLYCOLAX  Take 17 g by mouth daily as needed for mild constipation.   thiamine 100 MG tablet Commonly known as: Vitamin B-1 Take 1 tablet (100 mg total) by mouth daily.        Allergies  Allergen Reactions   Benzoyl Peroxide Anaphylaxis   Fish Oil Anaphylaxis   Methocarbamol Anaphylaxis   Pineapple Anaphylaxis   Carbomer    Tuberculin, Ppd     Other Reaction(s): Eruption of skin   Tuberculin Purified Protein Derivative Hives and Rash    Consultations: Neurology PCCM   Procedures/Studies: EEG  adult Result Date: 10/26/2023 Matthews Elida HERO, MD     10/28/2023 12:13 PM Routine EEG Report Bishoy Cupp is a 68 y.o. male with a history of altered mental status who is undergoing an EEG to evaluate for seizures. Report: This EEG was acquired with electrodes placed according to the International 10-20 electrode system (including Fp1, Fp2, F3, F4, C3, C4, P3, P4, O1, O2, T3, T4, T5, T6, A1, A2, Fz, Cz, Pz). The following electrodes were missing or displaced: none. The occipital dominant rhythm was 8.5 Hz. This activity is reactive to stimulation. Drowsiness was manifested by background fragmentation; deeper stages of sleep were identified by K complexes and sleep spindles. There was no focal slowing. There were no interictal epileptiform discharges. There were no electrographic seizures identified. There was no abnormal response to photic stimulation or hyperventilation. Impression: This EEG was obtained while awake and asleep and is normal.   Clinical Correlation: Normal EEGs, however, do  not rule out epilepsy. Elida Matthews, MD [facility] [phone] If 7pm- 7am, please page neurology on call as listed in AMION.   MR BRAIN WO CONTRAST Result Date: 10/25/2023 EXAM: MRI BRAIN WITHOUT CONTRAST 10/25/2023 01:05:00 PM TECHNIQUE: Multiplanar multisequence MRI of the head/brain was performed without the administration of intravenous contrast. COMPARISON: Same day CT head. CLINICAL HISTORY: Stroke, follow up. AMS; Latest CT: Stable intraparenchymal hemorrhage within the left basal ganglia and external capsule, measuring approximately 2.4 x 1.6 x 3.2 cm, with mild surrounding edema. No evidence of interval hemorrhage. FINDINGS: BRAIN AND VENTRICLES: Prominent region of signal abnormality and susceptibility within the left external capsule and corona radiata measuring 2.8 x 1.2 cm compatible with intraparenchymal hemorrhage seen on same day CT. There is surrounding edema within the posterior aspect of the left corona radiata and within the left external capsule extending into the subinsular region with edema also extending into the left temporal white matter. Mild local mass effect. There is partial effacement of the atrium and temporal horn of the left lateral ventricle. No significant midline shift. No hydrocephalus. Moderate chronic microvascular ischemic changes. There is a region of restricted diffusion in the left corona radiata extending into the centrum semiovale adjacent to the region of hemorrhage compatible with infarct. The sella is unremarkable. Normal flow voids. ORBITS: Rightward gaze deviation. SINUSES AND MASTOIDS: Mild scattered mucosal thickening in the ethmoid sinuses and right maxillary sinus. BONES AND SOFT TISSUES: Normal marrow signal. No acute soft tissue abnormality. IMPRESSION: 1. Acute hemorrhagic infarct involving the left centrum semiovale, left corona radiata, and external capsule with partial involvement of the posterior left lentiform nucleus. Similar  appearance of intraparenchymal hematoma. 2. Surrounding edema and mild local mass effect. No significant midline shift. 3. Moderate chronic microvascular ischemic changes. Electronically signed by: Donnice Mania MD 10/25/2023 02:07 PM EDT RP Workstation: [id number]   DG Abd 1 View Result Date: 10/25/2023 CLINICAL DATA:  MRI screening for metallic foreign body. EXAM: ABDOMEN - 1 VIEW COMPARISON:  None Available. FINDINGS: No metallic foreign bodies identified. The bowel gas pattern is normal. No radio-opaque calculi or other significant radiographic abnormality are seen. Pelvic phleboliths incidentally noted. IMPRESSION: No metallic foreign bodies identified.  Normal bowel gas pattern. Electronically Signed   By: Norleen DELENA Kil M.D.   On: 10/25/2023 11:56   DG Chest Port 1 View Result Date: 10/25/2023 CLINICAL DATA:  MRI screening for metallic foreign body. EXAM: PORTABLE CHEST 1 VIEW COMPARISON:  06/08/2023 FINDINGS: No metallic foreign bodies seen. The heart size and mediastinal contours are  within normal limits. Both lungs are clear. The visualized skeletal structures are unremarkable. IMPRESSION: No active disease. No metallic foreign body identified. Electronically Signed   By: Norleen DELENA Kil M.D.   On: 10/25/2023 11:54   CT HEAD WO CONTRAST ( ) Result Date: 10/25/2023 EXAM: CT HEAD WITHOUT CONTRAST 10/25/2023 09:33:46 AM TECHNIQUE: CT of the head was performed without the administration of intravenous contrast. Automated exposure control, iterative reconstruction, and/or weight based adjustment of the mA/kV was utilized to reduce the radiation dose to as low as reasonably achievable. COMPARISON: CT of the head dated 10/24/2023. CLINICAL HISTORY: Mental status change, unknown cause; Neuro deficit, acute, stroke suspected; ICH, rising NIH score. ICH followup. FINDINGS: BRAIN AND VENTRICLES: The intraparenchymal hemorrhage previously seen within the left basal ganglia and external capsule has remained  stable in size measuring approximately 2.4 x 1.6 x 3.2 cm. There is mild surrounding edema. There is no evidence of interval hemorrhage. There is mild periventricular white matter disease. No evidence of acute infarct. No hydrocephalus. No extra-axial collection. No mass effect or midline shift. ORBITS: No acute abnormality. SINUSES: No acute abnormality. SOFT TISSUES AND SKULL: No acute soft tissue abnormality. No skull fracture. IMPRESSION: 1. Stable intraparenchymal hemorrhage within the left basal ganglia and external capsule, measuring approximately 2.4 x 1.6 x 3.2 cm, with mild surrounding edema. No evidence of interval hemorrhage. 2. Mild periventricular white matter disease. Electronically signed by: Evalene Coho MD 10/25/2023 09:39 AM EDT RP Workstation: [id number]   CT HEAD WO CONTRAST ( ) Result Date: 10/24/2023 CLINICAL DATA:  Known intracranial hemorrhage EXAM: CT HEAD WITHOUT CONTRAST TECHNIQUE: Contiguous axial images were obtained from the base of the skull through the vertex without intravenous contrast. RADIATION DOSE REDUCTION: This exam was performed according to the departmental dose-optimization program which includes automated exposure control, adjustment of the mA and/or kV according to patient size and/or use of iterative reconstruction technique. COMPARISON:  10/24/2023 FINDINGS: Brain: Stable acute left basal ganglia intraparenchymal hemorrhage, measuring 3.1 x 2.5 x 1.8 cm by my measurement. Minimal surrounding edema wall, without significant mass effect or midline shift. There are no new areas of hemorrhage or infarct. Lateral ventricles and remaining midline structures are unremarkable. No acute extra-axial fluid collections. Vascular: No hyperdense vessel or unexpected calcification. Skull: Normal. Negative for fracture or focal lesion. Sinuses/Orbits: No acute finding. Other: None. IMPRESSION: 1. Stable left basal ganglia acute intraparenchymal hemorrhage, with minimal  surrounding edema. No significant mass effect or midline shift. 2. No new areas of hemorrhage or infarct. Electronically Signed   By: Ozell Daring M.D.   On: 10/24/2023 19:06   ECHOCARDIOGRAM COMPLETE Result Date: 10/24/2023    ECHOCARDIOGRAM REPORT   Patient Name:   Parker Brown Date of Exam: 10/24/2023 Medical Rec #:  [id number]                 Height:       65.0 in Accession #:    [id number]                Weight:       138.7 lb Date of Birth:  Dec 17, 1955                 BSA:          1.693 m Patient Age:    68 years                  BP:           138/87 mmHg Patient  Gender: M                         HR:           87 bpm. Exam Location:  [facility] Procedure: 2D Echo, Cardiac Doppler, Color Doppler and Strain Analysis (Both            Spectral and Color Flow Doppler were utilized during procedure). Indications:     Stroke I63.9  History:         Patient has no prior history of Echocardiogram examinations.  Sonographer:     Thedora Louder RDCS, FASE Referring Phys:  [id number] ALMARIE BAKE OUMA Diagnosing Phys: Cara JONETTA Lovelace MD  Sonographer Comments: Global longitudinal strain was attempted. Challenging study due to both low acoustic windows and chest wall/ lung interference. IMPRESSIONS  1. Left ventricular ejection fraction, by estimation, is 60 to 65%. The left ventricle has normal function. The left ventricle has no regional wall motion abnormalities. There is mild concentric left ventricular hypertrophy. Left ventricular diastolic parameters are consistent with Grade I diastolic dysfunction (impaired relaxation). The average left ventricular global longitudinal strain is 22.7 %. The global longitudinal strain is normal.  2. Right ventricular systolic function is normal. The right ventricular size is normal.  3. The mitral valve is normal in structure. Trivial mitral valve regurgitation.  4. The aortic valve is normal in structure. Aortic valve regurgitation is not visualized. Aortic valve  sclerosis is present, with no evidence of aortic valve stenosis. FINDINGS  Left Ventricle: Left ventricular ejection fraction, by estimation, is 60 to 65%. The left ventricle has normal function. The left ventricle has no regional wall motion abnormalities. The average left ventricular global longitudinal strain is 22.7 %. Strain was performed and the global longitudinal strain is normal. The left ventricular internal cavity size was normal in size. There is mild concentric left ventricular hypertrophy. Left ventricular diastolic parameters are consistent with Grade I diastolic dysfunction (impaired relaxation). Right Ventricle: The right ventricular size is normal. No increase in right ventricular wall thickness. Right ventricular systolic function is normal. Left Atrium: Left atrial size was normal in size. Right Atrium: Right atrial size was normal in size. Pericardium: There is no evidence of pericardial effusion. Mitral Valve: The mitral valve is normal in structure. Trivial mitral valve regurgitation. Tricuspid Valve: The tricuspid valve is normal in structure. Tricuspid valve regurgitation is mild. Aortic Valve: The aortic valve is normal in structure. Aortic valve regurgitation is not visualized. Aortic valve sclerosis is present, with no evidence of aortic valve stenosis. Aortic valve peak gradient measures 8.4 mmHg. Pulmonic Valve: The pulmonic valve was normal in structure. Pulmonic valve regurgitation is not visualized. Aorta: The ascending aorta was not well visualized. IAS/Shunts: No atrial level shunt detected by color flow Doppler. Additional Comments: 3D was performed not requiring image post processing on an independent workstation and was indeterminate.  LEFT VENTRICLE PLAX 2D LVIDd:         4.70 cm   Diastology LVIDs:         3.10 cm   LV e' medial:    5.44 cm/s LV PW:         1.10 cm   LV E/e' medial:  11.8 LV IVS:        1.20 cm   LV e' lateral:   6.53 cm/s LVOT diam:     2.10 cm   LV E/e'  lateral: 9.8 LV SV:  69 LV SV Index:   41        2D Longitudinal Strain LVOT Area:     3.46 cm  2D Strain GLS (A4C):   21.0 %                          2D Strain GLS (A3C):   17.6 %                          2D Strain GLS (A2C):   29.4 %                          2D Strain GLS Avg:     22.7 % RIGHT VENTRICLE RV Basal diam:  3.80 cm RV S prime:     15.40 cm/s TAPSE (M-mode): 2.6 cm LEFT ATRIUM             Index        RIGHT ATRIUM           Index LA diam:        3.30 cm 1.95 cm/m   RA Area:     14.40 cm LA Vol (A2C):   9.7 ml  5.72 ml/m   RA Volume:   32.30 ml  19.08 ml/m LA Vol (A4C):   22.7 ml 13.41 ml/m LA Biplane Vol: 15.4 ml 9.10 ml/m  AORTIC VALVE AV Area (Vmax): 2.63 cm AV Vmax:        145.00 cm/s AV Peak Grad:   8.4 mmHg LVOT Vmax:      110.00 cm/s LVOT Vmean:     70.100 cm/s LVOT VTI:       0.198 m  AORTA Ao Root diam: 4.00 cm Ao Asc diam:  4.70 cm MITRAL VALVE MV Area (PHT): 3.93 cm    SHUNTS MV Decel Time: 193 msec    Systemic VTI:  0.20 m MV E velocity: 64.30 cm/s  Systemic Diam: 2.10 cm MV A velocity: 94.30 cm/s MV E/A ratio:  0.68 Dwayne D Callwood MD Electronically signed by Cara JONETTA Lovelace MD Signature Date/Time: 10/24/2023/2:23:55 PM    Final    CT HEAD WO CONTRAST ( ) Result Date: 10/24/2023 EXAM: CT HEAD WITHOUT CONTRAST 10/24/2023 06:19:16 AM TECHNIQUE: CT of the head was performed without the administration of intravenous contrast. Automated exposure control, iterative reconstruction, and/or weight based adjustment of the mA/kV was utilized to reduce the radiation dose to as low as reasonably achievable. COMPARISON: 10/23/2023 CLINICAL HISTORY: Neuro deficit, acute, stroke suspected. FINDINGS: BRAIN AND VENTRICLES: Stable acute intraparenchymal hemorrhage in left basal ganglia measuring 2.5 x 1.7 x 3.2 cm. Mild surrounding edema with local mass effect. Patchy white matter hypodensities compatible with chronic microvascular ischemic disease. No evidence of acute infarct. No  hydrocephalus. No extra-axial collection. No midline shift. ORBITS: No acute abnormality. SINUSES: No acute abnormality. SOFT TISSUES AND SKULL: No acute soft tissue abnormality. No skull fracture. IMPRESSION: 1. Stable acute intraparenchymal hemorrhage in left basal ganglia measuring 2.5 x 1.7 x 3.2 cm, with mild surrounding edema and local mass effect. 2. Patchy white matter hypodensities compatible with chronic microvascular ischemic disease. Electronically signed by: Evalene Coho MD 10/24/2023 06:23 AM EDT RP Workstation: [id number]   CT ANGIO HEAD NECK W WO CM W PERF (CODE STROKE) Result Date: 10/24/2023 EXAM: CTA Head and Neck with Perfusion 10/24/2023 01:41:45 AM TECHNIQUE: CTA of the head and neck was performed  without and with the administration of 125 mL of iohexol (OMNIPAQUE) 350 MG/ML injection. 3D postprocessing with multiplanar reconstructions and MIPs was performed to evaluate the vascular anatomy. Cerebral perfusion analysis using computed tomography with contrast administration, including post-processing of parametric maps with determination of cerebral blood flow, cerebral blood volume, mean transit time and time-to-maximum. Automated exposure control, iterative reconstruction, and/or weight based adjustment of the mA/kV was utilized to reduce the radiation dose to as low as reasonably achievable. COMPARISON: CT from earlier the same day. CLINICAL HISTORY: Neuro deficit, acute, stroke suspected. FINDINGS: CTA NECK: AORTIC ARCH AND ARCH VESSELS: No dissection or arterial injury. No significant stenosis of the brachiocephalic or subclavian arteries. CERVICAL CAROTID ARTERIES: No dissection, arterial injury, or hemodynamically significant stenosis by NASCET criteria. CERVICAL VERTEBRAL ARTERIES: No dissection, arterial injury, or significant stenosis. LUNGS AND MEDIASTINUM: Unremarkable. SOFT TISSUES: 1.5 cm subcutaneous lipoma noted at the subcutaneous fat of the left chin. BONES: No acute  abnormality. CTA HEAD: ANTERIOR CIRCULATION: No significant stenosis of the internal carotid arteries. No significant stenosis of the anterior cerebral arteries. No significant stenosis of the middle cerebral arteries. No aneurysm. POSTERIOR CIRCULATION: No significant stenosis of the posterior cerebral arteries. No significant stenosis of the basilar artery. No significant stenosis of the vertebral arteries. No aneurysm. OTHER: No vascular abnormality seen underlying the acute left cerebral hemorrhage. No spot sign. No dural venous sinus thrombosis on this non-dedicated study. CT PERFUSION: EXAM QUALITY: Exam quality is adequate with diagnostic perfusion maps. No significant motion artifact. Appropriate arterial inflow and venous outflow curves. CORE INFARCT (CBF<30% volume): 10 mL TOTAL HYPOPERFUSION (Tmax>6s volume): 8.  mL PENUMBRA: Mismatch volume: -2 mL Mismatch ratio: 0.8 Location:  matched perfusion deficit at the left basal ganglia/external capsule, corresponding with the acute left cerebral hemorrhage. No other perfusion abnormality. IMPRESSION: 1. Negative CTA for large vessel occlusion or other emergent findings. No hemodynamically significant or correctable stenosis. 2. No vascular abnormality seen underlying the acute left cerebral hemorrhage. 3. 10 ml matched perfusion deficit within the left cerebral hemisphere, corresponding with the acute hemorrhage. No other perfusion abnormality. Electronically signed by: Morene Hoard MD 10/24/2023 02:08 AM EDT RP Workstation: [id number]   CT CERVICAL SPINE WO CONTRAST Result Date: 10/24/2023 EXAM: CT CERVICAL SPINE WITHOUT CONTRAST 10/24/2023 12:05:39 AM TECHNIQUE: CT of the cervical spine was performed without the administration of intravenous contrast. Multiplanar reformatted images are provided for review. Automated exposure control, iterative reconstruction, and/or weight based adjustment of the mA/kV was utilized to reduce the radiation dose to  as low as reasonably achievable. COMPARISON: None available. CLINICAL HISTORY: Neuro deficit, acute, stroke suspected. The patient suffered an unwitnessed fall and was found outside on the ground by family. FINDINGS: CERVICAL SPINE: BONES AND ALIGNMENT: There is mild osteopenia. There is no evidence of fractures or traumatic malalignment. No focal pathologic bone lesions. There is a 2 mm grade 1 C5-C6 retrolisthesis likely due to discogenic degenerative arthrosis. Similar trace C2-C3 retrolisthesis. DEGENERATIVE CHANGES: There is degenerative disc space loss greatest at C2-C3, C6-C7, and C7-T1, mild disc space loss at C5-C6, normal C3-C4 and C4-C5 disc heights. There are small anterior endplate osteophytes at C2-C3, C5-C6, C6-C7, and C7-T1 but no significant posterior osteophytes. No herniated discs or cord compromise are seen. There is mild facet joint spurring at most levels, but no levels show significant foraminal encroachment. SOFT TISSUES: No prevertebral soft tissue swelling. There is multivocal advanced tooth decay on limited imaging through the posterior oral cavity. Follow up with a dentist is  recommended. No thyroid or laryngeal mass is seen. There is asymmetric calcification at the left carotid bifurcation. . Scarring changes at both lung apices but no apical pneumothorax. IMPRESSION: 1. No acute abnormality of the cervical spine. 2. Osteopenia and degenerative change. 3. Multifocal advanced tooth decay; dental follow-up is recommended. 4. Asymmetric calcification at the left carotid bifurcation. Electronically signed by: Francis Quam MD 10/24/2023 12:35 AM EDT RP Workstation: [id number]   CT HEAD WO CONTRAST ( ) Result Date: 10/24/2023 EXAM: CT HEAD WITHOUT CONTRAST 10/24/2023 12:05:39 AM TECHNIQUE: CT of the head was performed without the administration of intravenous contrast. Automated exposure control, iterative reconstruction, and/or weight based adjustment of the mA/kV was utilized to  reduce the radiation dose to as low as reasonably achievable. COMPARISON: None available. CLINICAL HISTORY: Neuro deficit, acute, stroke suspected. Patient presented to the ED from home via [facility] for a fall. Denies LOC. Patient did strike his head. Patient has a small cut to the right shin. Patient is obviously intoxicated. Patient has decreased sensation and motor function on the right side. BP 204/104. Has not taken medications. HR 77. Unknown last known well. Patient was found outside lying on the ground by his sister. FINDINGS: BRAIN AND VENTRICLES: Acute 2.4 x 1.5 x 3.0 cm hemorrhage within left basal ganglia with mild surrounding edema. Patchy white matter hypodensities, compatible with chronic microvascular ischemic disease. Minimal adjacent mass effect. No midline shift. No hydrocephalus. No extra-axial collection. No evidence of acute infarct. ORBITS: No acute abnormality. SINUSES: No acute abnormality. SOFT TISSUES AND SKULL: No acute soft tissue abnormality. No skull fracture. IMPRESSION: 1. Acute left basal ganglia intraparenchymal hemorrhage with mild edema and minimal mass effect. 2. CRITICAL VALUE/EMERGENT RESULTS WERE CALLED BY TELEPHONE AT THE TIME OF INTERPRETATION ON 10 / 18 / 25 at 12:12 am to Dr. Jossie, WHO VERBALLY ACKNOWLEDGED THESE RESULTS. Electronically signed by: Norman Gatlin MD 10/24/2023 12:19 AM EDT RP Workstation: [id number]      Subjective: Seen and examined on the day of discharge.  Stable no distress.  Appropriate for discharge to inpatient rehab.  Discharge Exam: Vitals:   11/10/23 0500 11/10/23 0805  BP: 122/89 135/84  Pulse: 75 68  Resp: 18 16  Temp: 98.1 F (36.7 C) 98 F (36.7 C)  SpO2: 99% 97%   Vitals:   11/09/23 1620 11/09/23 2100 11/10/23 0500 11/10/23 0805  BP: 123/78 (!) 142/98 122/89 135/84  Pulse: 69 73 75 68  Resp: 18 16 18 16   Temp: 98.1 F (36.7 C) 97.9 F (36.6 C) 98.1 F (36.7 C) 98 F (36.7 C)  TempSrc: Oral Oral Oral Oral  SpO2:  99% 100% 99% 97%  Weight:   60.1 kg   Height:        General: Pt is alert, awake, not in acute distress Cardiovascular: RRR, S1/S2 +, no rubs, no gallops Respiratory: CTA bilaterally, no wheezing, no rhonchi Abdominal: Soft, NT, ND, bowel sounds + Extremities: no edema, no cyanosis    The results of significant diagnostics from this hospitalization (including imaging, microbiology, ancillary and laboratory) are listed below for reference.     Microbiology: No results found for this or any previous visit (from the past 240 hours).   Labs: BNP (last 3 results) Recent Labs    06/08/23 1040  BNP 54.7   Basic Metabolic Panel: Recent Labs  Lab 11/04/23 0436  NA 138  K 4.1  CL 102  CO2 23  GLUCOSE 96  BUN 26*  CREATININE 1.12  CALCIUM 9.1  Liver Function Tests: No results for input(s): AST, ALT, ALKPHOS, BILITOT, PROT, ALBUMIN in the last 168 hours. No results for input(s): LIPASE, AMYLASE in the last 168 hours. No results for input(s): AMMONIA in the last 168 hours. CBC: Recent Labs  Lab 11/04/23 0436  WBC 4.6  HGB 12.3*  HCT 37.9*  MCV 90.7  PLT 301   Cardiac Enzymes: No results for input(s): CKTOTAL, CKMB, CKMBINDEX, TROPONINI in the last 168 hours. BNP: Invalid input(s): POCBNP CBG: Recent Labs  Lab 11/04/23 1650  GLUCAP 102*   D-Dimer No results for input(s): DDIMER in the last 72 hours. Hgb A1c No results for input(s): HGBA1C in the last 72 hours. Lipid Profile No results for input(s): CHOL, HDL, LDLCALC, TRIG, CHOLHDL, LDLDIRECT in the last 72 hours. Thyroid function studies No results for input(s): TSH, T4TOTAL, T3FREE, THYROIDAB in the last 72 hours.  Invalid input(s): FREET3 Anemia work up No results for input(s): VITAMINB12, FOLATE, FERRITIN, TIBC, IRON, RETICCTPCT in the last 72 hours. Urinalysis No results found for: COLORURINE, APPEARANCEUR, LABSPEC, PHURINE,  GLUCOSEU, HGBUR, BILIRUBINUR, KETONESUR, PROTEINUR, UROBILINOGEN, NITRITE, LEUKOCYTESUR Sepsis Labs Recent Labs  Lab 11/04/23 0436  WBC 4.6   Microbiology No results found for this or any previous visit (from the past 240 hours).   Time coordinating discharge: 40 minutes  SIGNED:   Calvin KATHEE Robson, MD  [facility] 11/10/2023, 8:31 AM Pager   If 7PM-7AM, please contact night-coverage

## 2024-01-16 ENCOUNTER — Inpatient Hospital Stay
Admission: EM | Admit: 2024-01-16 | Discharge: 2024-01-19 | DRG: 189 | Disposition: A | Discharge status: Home / Self Care | Attending: Internal Medicine | Admitting: Internal Medicine

## 2024-01-16 ENCOUNTER — Emergency Department

## 2024-01-16 DIAGNOSIS — E785 Hyperlipidemia, unspecified: Secondary | ICD-10-CM | POA: Diagnosis present

## 2024-01-16 DIAGNOSIS — Z1152 Encounter for screening for COVID-19: Secondary | ICD-10-CM

## 2024-01-16 DIAGNOSIS — F17210 Nicotine dependence, cigarettes, uncomplicated: Secondary | ICD-10-CM | POA: Diagnosis present

## 2024-01-16 DIAGNOSIS — J9601 Acute respiratory failure with hypoxia: Principal | ICD-10-CM | POA: Diagnosis present

## 2024-01-16 DIAGNOSIS — Z79899 Other long term (current) drug therapy: Secondary | ICD-10-CM

## 2024-01-16 DIAGNOSIS — J441 Chronic obstructive pulmonary disease with (acute) exacerbation: Secondary | ICD-10-CM | POA: Diagnosis present

## 2024-01-16 DIAGNOSIS — Z888 Allergy status to other drugs, medicaments and biological substances status: Secondary | ICD-10-CM

## 2024-01-16 DIAGNOSIS — Z993 Dependence on wheelchair: Secondary | ICD-10-CM

## 2024-01-16 DIAGNOSIS — I10 Essential (primary) hypertension: Secondary | ICD-10-CM | POA: Diagnosis present

## 2024-01-16 DIAGNOSIS — I6930 Unspecified sequelae of cerebral infarction: Secondary | ICD-10-CM

## 2024-01-16 DIAGNOSIS — I69351 Hemiplegia and hemiparesis following cerebral infarction affecting right dominant side: Secondary | ICD-10-CM

## 2024-01-16 DIAGNOSIS — Z7401 Bed confinement status: Secondary | ICD-10-CM

## 2024-01-16 MED ORDER — IPRATROPIUM-ALBUTEROL 0.5-2.5 (3) MG/3ML IN SOLN
3.0000 mL | Freq: Once | RESPIRATORY_TRACT | Status: AC
  Administered 2024-01-17: 3 mL via RESPIRATORY_TRACT

## 2024-01-16 NOTE — ED Triage Notes (Signed)
 Pt. In via EMS, c/o SOB all day today, 82% on RA when EMS arrived, duoneb, albuterol  and solumedrol en route, hx: COPD, smoker, no O2 at baseline, currently on 15L NRB and 35 Respirations minute

## 2024-01-16 NOTE — ED Notes (Signed)
 Called CCMD spoke with Natalia to place pt on cardiac monitor

## 2024-01-16 NOTE — ED Notes (Signed)
 Fall risk bundle is currently in place.

## 2024-01-17 DIAGNOSIS — I10 Essential (primary) hypertension: Secondary | ICD-10-CM | POA: Diagnosis present

## 2024-01-17 DIAGNOSIS — Z1152 Encounter for screening for COVID-19: Secondary | ICD-10-CM | POA: Diagnosis not present

## 2024-01-17 DIAGNOSIS — E785 Hyperlipidemia, unspecified: Secondary | ICD-10-CM | POA: Diagnosis present

## 2024-01-17 DIAGNOSIS — Z79899 Other long term (current) drug therapy: Secondary | ICD-10-CM | POA: Diagnosis not present

## 2024-01-17 DIAGNOSIS — Z888 Allergy status to other drugs, medicaments and biological substances status: Secondary | ICD-10-CM | POA: Diagnosis not present

## 2024-01-17 DIAGNOSIS — F17210 Nicotine dependence, cigarettes, uncomplicated: Secondary | ICD-10-CM | POA: Diagnosis present

## 2024-01-17 DIAGNOSIS — J9601 Acute respiratory failure with hypoxia: Secondary | ICD-10-CM | POA: Diagnosis present

## 2024-01-17 DIAGNOSIS — Z7401 Bed confinement status: Secondary | ICD-10-CM | POA: Diagnosis not present

## 2024-01-17 DIAGNOSIS — Z993 Dependence on wheelchair: Secondary | ICD-10-CM | POA: Diagnosis not present

## 2024-01-17 DIAGNOSIS — J189 Pneumonia, unspecified organism: Secondary | ICD-10-CM | POA: Insufficient documentation

## 2024-01-17 DIAGNOSIS — J441 Chronic obstructive pulmonary disease with (acute) exacerbation: Secondary | ICD-10-CM | POA: Diagnosis present

## 2024-01-17 DIAGNOSIS — I69351 Hemiplegia and hemiparesis following cerebral infarction affecting right dominant side: Secondary | ICD-10-CM | POA: Diagnosis not present

## 2024-01-17 LAB — CBC WITH DIFFERENTIAL/PLATELET
Abs Immature Granulocytes: 0.01 K/uL (ref 0.00–0.07)
Basophils Absolute: 0.1 K/uL (ref 0.0–0.1)
Basophils Relative: 1 %
Eosinophils Absolute: 0.3 K/uL (ref 0.0–0.5)
Eosinophils Relative: 5 %
HCT: 43.4 % (ref 39.0–52.0)
Hemoglobin: 14.2 g/dL (ref 13.0–17.0)
Immature Granulocytes: 0 %
Lymphocytes Relative: 44 %
Lymphs Abs: 2.3 K/uL (ref 0.7–4.0)
MCH: 29.6 pg (ref 26.0–34.0)
MCHC: 32.7 g/dL (ref 30.0–36.0)
MCV: 90.4 fL (ref 80.0–100.0)
Monocytes Absolute: 0.5 K/uL (ref 0.1–1.0)
Monocytes Relative: 10 %
Neutro Abs: 2 K/uL (ref 1.7–7.7)
Neutrophils Relative %: 40 %
Platelets: 265 K/uL (ref 150–400)
RBC: 4.8 MIL/uL (ref 4.22–5.81)
RDW: 14.5 % (ref 11.5–15.5)
WBC: 5.1 K/uL (ref 4.0–10.5)
nRBC: 0 % (ref 0.0–0.2)

## 2024-01-17 LAB — BLOOD GAS, VENOUS
Acid-Base Excess: 2 mmol/L (ref 0.0–2.0)
Bicarbonate: 29.4 mmol/L — ABNORMAL HIGH (ref 20.0–28.0)
O2 Saturation: 51.5 %
Patient temperature: 37
pCO2, Ven: 57 mmHg (ref 44–60)
pH, Ven: 7.32 (ref 7.25–7.43)
pO2, Ven: 34 mmHg (ref 32–45)

## 2024-01-17 LAB — RESPIRATORY PANEL BY PCR

## 2024-01-17 LAB — COMPREHENSIVE METABOLIC PANEL WITH GFR
ALT: 11 U/L (ref 0–44)
AST: 25 U/L (ref 15–41)
Albumin: 4.3 g/dL (ref 3.5–5.0)
Alkaline Phosphatase: 69 U/L (ref 38–126)
Anion gap: 10 (ref 5–15)
BUN: 12 mg/dL (ref 8–23)
CO2: 25 mmol/L (ref 22–32)
Calcium: 9.1 mg/dL (ref 8.9–10.3)
Chloride: 105 mmol/L (ref 98–111)
Creatinine, Ser: 0.98 mg/dL (ref 0.61–1.24)
GFR, Estimated: >60 mL/min
Glucose, Bld: 102 mg/dL — ABNORMAL HIGH (ref 70–99)
Potassium: 3.7 mmol/L (ref 3.5–5.1)
Sodium: 140 mmol/L (ref 135–145)
Total Bilirubin: 0.3 mg/dL (ref 0.0–1.2)
Total Protein: 7.7 g/dL (ref 6.5–8.1)

## 2024-01-17 LAB — PROTIME-INR
INR: 0.9 (ref 0.8–1.2)
Prothrombin Time: 13.1 s (ref 11.4–15.2)

## 2024-01-17 LAB — RESP PANEL BY RT-PCR (RSV, FLU A&B, COVID)  RVPGX2
Influenza A by PCR: NEGATIVE
Influenza B by PCR: NEGATIVE
Resp Syncytial Virus by PCR: NEGATIVE
SARS Coronavirus 2 by RT PCR: NEGATIVE

## 2024-01-17 LAB — MAGNESIUM: Magnesium: 2.1 mg/dL (ref 1.7–2.4)

## 2024-01-17 LAB — TROPONIN T, HIGH SENSITIVITY
Troponin T High Sensitivity: <15 ng/L (ref 0–19)
Troponin T High Sensitivity: <15 ng/L (ref 0–19)

## 2024-01-17 LAB — PRO BRAIN NATRIURETIC PEPTIDE: Pro Brain Natriuretic Peptide: 72 pg/mL

## 2024-01-17 LAB — LACTIC ACID, PLASMA: Lactic Acid, Venous: 1.8 mmol/L (ref 0.5–1.9)

## 2024-01-17 MED ORDER — ENOXAPARIN SODIUM 40 MG/0.4ML IJ SOSY
40.0000 mg | PREFILLED_SYRINGE | INTRAMUSCULAR | Status: DC
  Administered 2024-01-17 – 2024-01-19 (×3): 40 mg via SUBCUTANEOUS
  Filled 2024-01-17 (×3): qty 0.4

## 2024-01-17 MED ORDER — HYDRALAZINE HCL 20 MG/ML IJ SOLN: 5.0000 mg | Freq: Four times a day (QID) | INTRAMUSCULAR | Status: DC | PRN

## 2024-01-17 MED ORDER — FLUTICASONE FUROATE-VILANTEROL 200-25 MCG/ACT IN AEPB
1.0000 | INHALATION_SPRAY | Freq: Every day | RESPIRATORY_TRACT | Status: DC
  Administered 2024-01-17 – 2024-01-19 (×3): 1 via RESPIRATORY_TRACT
  Filled 2024-01-17 (×2): qty 28

## 2024-01-17 MED ORDER — TRAZODONE HCL 50 MG PO TABS
25.0000 mg | ORAL_TABLET | Freq: Every evening | ORAL | Status: DC | PRN
  Administered 2024-01-18: 25 mg via ORAL
  Filled 2024-01-17: qty 1

## 2024-01-17 MED ORDER — ACETAMINOPHEN 650 MG RE SUPP: 650.0000 mg | Freq: Four times a day (QID) | RECTAL | Status: DC | PRN

## 2024-01-17 MED ORDER — AMLODIPINE BESYLATE 5 MG PO TABS
10.0000 mg | ORAL_TABLET | Freq: Every day | ORAL | Status: DC
  Administered 2024-01-17 – 2024-01-19 (×3): 10 mg via ORAL
  Filled 2024-01-17: qty 2
  Filled 2024-01-17: qty 1
  Filled 2024-01-17: qty 2

## 2024-01-17 MED ORDER — ONDANSETRON HCL 4 MG PO TABS: 4.0000 mg | ORAL_TABLET | Freq: Four times a day (QID) | ORAL | Status: DC | PRN

## 2024-01-17 MED ORDER — ACETAMINOPHEN 325 MG PO TABS: 650.0000 mg | ORAL_TABLET | Freq: Four times a day (QID) | ORAL | Status: DC | PRN

## 2024-01-17 MED ORDER — ALBUTEROL SULFATE (2.5 MG/3ML) 0.083% IN NEBU: 2.5000 mg | INHALATION_SOLUTION | Freq: Four times a day (QID) | RESPIRATORY_TRACT | Status: DC | PRN

## 2024-01-17 MED ORDER — METHYLPREDNISOLONE SODIUM SUCC 125 MG IJ SOLR
80.0000 mg | Freq: Every day | INTRAMUSCULAR | Status: DC
  Administered 2024-01-17 – 2024-01-18 (×2): 80 mg via INTRAVENOUS
  Filled 2024-01-17 (×2): qty 2

## 2024-01-17 MED ORDER — LOSARTAN POTASSIUM 50 MG PO TABS
100.0000 mg | ORAL_TABLET | Freq: Every day | ORAL | Status: DC
  Administered 2024-01-17 – 2024-01-19 (×3): 100 mg via ORAL
  Filled 2024-01-17 (×3): qty 2

## 2024-01-17 MED ORDER — POLYETHYLENE GLYCOL 3350 17 G PO PACK: 17.0000 g | PACK | Freq: Every day | ORAL | Status: DC | PRN

## 2024-01-17 MED ORDER — ONDANSETRON HCL 4 MG/2ML IJ SOLN: 4.0000 mg | Freq: Four times a day (QID) | INTRAMUSCULAR | Status: DC | PRN

## 2024-01-17 MED ORDER — AZITHROMYCIN 500 MG PO TABS
500.0000 mg | ORAL_TABLET | Freq: Every day | ORAL | Status: DC
  Administered 2024-01-18 – 2024-01-19 (×2): 500 mg via ORAL
  Filled 2024-01-17: qty 2
  Filled 2024-01-17: qty 1

## 2024-01-17 MED ORDER — IPRATROPIUM-ALBUTEROL 0.5-2.5 (3) MG/3ML IN SOLN
3.0000 mL | Freq: Four times a day (QID) | RESPIRATORY_TRACT | Status: DC
  Administered 2024-01-17 – 2024-01-18 (×7): 3 mL via RESPIRATORY_TRACT
  Filled 2024-01-17 (×7): qty 3

## 2024-01-17 MED ORDER — SODIUM CHLORIDE 0.9 % IV SOLN: 2.0000 g | INTRAVENOUS | Status: DC

## 2024-01-17 NOTE — ED Notes (Signed)
 MD Laurita made aware of pt refusing to put bipap back on.

## 2024-01-17 NOTE — ED Notes (Signed)
 RN to bedside to answer call light. Pt yelling at this RN and Olam PEAK. Pt reports that he wants to know when his lunch is coming. Pt removed bipap and refused to put it back on. Pt reports that he is frustrated with not getting his lunch and the amount of time it took the RN to answer call light. Pt educated that RN was in with other pts and got to bedside ASAP. Pt states Well I'm a patient too. A black patient but still a patient. Call the head nurse or doctor and find out where my lunch is. Pt reassured that Charge RN will be made aware and that his race was not a factor in the care he was receiving. Pt continues to demand RN to find out where lunch is. Call light within reach. Pt allowed RN to place pt back on 3L [employer]. Charge RN made aware.

## 2024-01-17 NOTE — ED Provider Notes (Signed)
 "  [facility] Provider Note    Event Date/Time   First MD Initiated Contact with Patient 01/16/24 2348     (approximate)   History   Shortness of Breath and Respiratory Distress  Level 5 caveat:  history/ROS limited by acute/critical illness  HPI Parker Brown is a 69 y.o. male whose medical history includes but is not limited to COPD with no supplemental oxygen, prior hemorrhagic infarction of the brain, cocaine/polysubstance abuse, and he reports prior exposure to agent orange.  He presents by EMS in severe respiratory distress.  He says that he has been short of breath all day long today.  When EMS arrived he was 82% on room air and was given a DuoNeb, and albuterol  treatment, and Solu-Medrol  125 mg and route.  He was placed on nonrebreather at 15 L and was satting in the low 90s upon arrival in severe distress.  He denies chest pain and abdominal pain.  No nausea     Physical Exam   Triage Vital Signs: ED Triage Vitals  Encounter Vitals Group     BP 01/16/24 2353 (!) 175/111     Girls Systolic BP Percentile --      Girls Diastolic BP Percentile --      Boys Systolic BP Percentile --      Boys Diastolic BP Percentile --      Pulse Rate 01/16/24 2349 79     Resp 01/16/24 2349 (!) 34     Temp 01/16/24 2349 98.1 F (36.7 C)     Temp Source 01/16/24 2349 Oral     SpO2 01/16/24 2349 100 %     Weight 01/16/24 2350 68.9 kg (151 lb 14.4 oz)     Height 01/16/24 2350 1.651 m (5' 5)     Head Circumference --      Peak Flow --      Pain Score 01/16/24 2350 0     Pain Loc --      Pain Education --      Exclude from Growth Chart --     Most recent vital signs: Vitals:   01/16/24 2353 01/17/24 0026  BP: (!) 175/111 (!) 158/119  Pulse:  76  Resp:  19  Temp:    SpO2:  100%    General: Awake, alert, ill-appearing CV:  Good peripheral perfusion.  Regular rhythm, normal heart sounds Resp:  Increased respiratory effort with tachypnea,  intercostal retractions, and accessory muscle usage.  Patient is tight with minimal air movement and minimal wheezing as a result of a poor air movement.   Abd:  No distention.    ED Results / Procedures / Treatments   Labs (all labs ordered are listed, but only abnormal results are displayed) Labs Reviewed  COMPREHENSIVE METABOLIC PANEL WITH GFR - Abnormal; Notable for the following components:      Result Value   Glucose, Bld 102 (*)    All other components within normal limits  BLOOD GAS, VENOUS - Abnormal; Notable for the following components:   Bicarbonate 29.4 (*)    All other components within normal limits  RESP PANEL BY RT-PCR (RSV, FLU A&B, COVID)  RVPGX2  CULTURE, BLOOD (ROUTINE X 2)  CULTURE, BLOOD (ROUTINE X 2)  LACTIC ACID, PLASMA  CBC WITH DIFFERENTIAL/PLATELET  PROTIME-INR  PRO BRAIN NATRIURETIC PEPTIDE  MAGNESIUM  TROPONIN T, HIGH SENSITIVITY     EKG  ED ECG REPORT I, Darleene Dome, the attending physician, personally viewed and interpreted this  ECG.  Date: 01/16/2024 EKG Time: 23: 52 Rate: 82 Rhythm: normal sinus rhythm QRS Axis: normal Intervals: normal ST/T Wave abnormalities: Non-specific ST segment / T-wave changes, but no clear evidence of acute ischemia. Narrative Interpretation: no definitive evidence of acute ischemia; does not meet STEMI criteria.    RADIOLOGY See ED course for details   PROCEDURES:  Critical Care performed: Yes, see critical care procedure note(s)  .1-3 Lead EKG Interpretation  Performed by: Gordan Huxley, MD Authorized by: Gordan Huxley, MD     Interpretation: normal     ECG rate:  80   ECG rate assessment: normal     Rhythm: sinus rhythm     Ectopy: none     Conduction: normal   .Critical Care  Performed by: Gordan Huxley, MD Authorized by: Gordan Huxley, MD   Critical care provider statement:    Critical care time (minutes):  40   Critical care time was exclusive of:  Separately billable procedures and  treating other patients   Critical care was necessary to treat or prevent imminent or life-threatening deterioration of the following conditions:  Respiratory failure   Critical care was time spent personally by me on the following activities:  Development of treatment plan with patient or surrogate, evaluation of patient's response to treatment, examination of patient, obtaining history from patient or surrogate, ordering and performing treatments and interventions, ordering and review of laboratory studies, ordering and review of radiographic studies, pulse oximetry, re-evaluation of patient's condition and review of old charts     IMPRESSION / MDM / ASSESSMENT AND PLAN / ED COURSE  I reviewed the triage vital signs and the nursing notes.                              Differential diagnosis includes, but is not limited to, COPD exacerbation, respiratory viral illness, pneumonia, new onset CHF, substance abuse  Patient's presentation is most consistent with acute presentation with potential threat to life or bodily function.  Labs/studies ordered (see ED course for additional labs and studies that may have been added later): Blood cultures x 2, lactic acid, VBG, magnesium, high-sensitivity troponin, proBNP, pro time-INR, CBC with differential, respiratory viral panel, CMP, chest x-ray, EKG  Interventions/Medications given:  Medications  ipratropium-albuterol  (DUONEB) 0.5-2.5 (3) MG/3ML nebulizer solution 3 mL (has no administration in time range)  ipratropium-albuterol  (DUONEB) 0.5-2.5 (3) MG/3ML nebulizer solution 3 mL (has no administration in time range)    (Note:  hospital course my include additional interventions and/or labs/studies not listed above.)   Severe respiratory distress, starting Bipap.  Standard workup.  Ordering 2 additional DuoNebs.  Patient breathing more easily on BiPAP, able to get some rest.  The patient is on the cardiac monitor to evaluate for evidence of  arrhythmia and/or significant heart rate changes.   Clinical Course as of 01/17/24 0113  [place] Jan 17, 2024  0011 Blood gas, venous(!) Reassuring VBG [CF]  0015 I independently viewed and interpreted the patient's chest x-ray(s), and I also reviewed the radiologist's report.  No evidence of pneumonia nor pulmonary edema [CF]  0038 Patient stable on BiPAP [CF]  0053 Patient breathing more comfortably on BiPAP, resting quietly.  Labs generally reassuring with no acute abnormalities.  Consulting hospitalist team for admission. [CF]  0113 I consulted by phone with the admitting hospitalist, and they will admit the patient - Dr. Lawence [CF]    Clinical Course User Index [CF] Gordan Huxley,  MD     FINAL CLINICAL IMPRESSION(S) / ED DIAGNOSES   Final diagnoses:  Acute respiratory failure with hypoxia (HCC)  COPD exacerbation (HCC)     Rx / DC Orders   ED Discharge Orders     None        Note:  This document was prepared using Dragon voice recognition software and may include unintentional dictation errors.   Gordan Huxley, MD 01/17/24 0113  "

## 2024-01-17 NOTE — H&P (Signed)
 " History and Physical    Supreme Rybarczyk FMW:[id number] DOB: 01/07/1955 DOA: 01/16/2024  PCP: Pcp, No (Confirm with patient/family/NH records and if not entered, this has to be entered at [facility] point of entry) Patient coming from: Home  I have personally briefly reviewed patient's old medical records in [facility]  Chief Complaint: Cough, wheezing, SOB  HPI: Parker Brown is a 69 y.o. male with medical history significant of COPD, HTN, HLD, presented with worsening of cough shortness of breath.  Symptoms started 3 days ago when patient started productive cough with whitish phlegm, increasing wheezing and exertional dyspnea.  Symptoms gradually getting worse but he denied any chest pain no fever or chills.  He still smokes  here and there ED Course: Afebrile, O2 saturation 82% on room air.  Chest x-ray negative for acute infiltrates, hemoglobin 10.7 WBC 7.5, 7.30 2/57/32.  Patient was given azithromycin , DuoNebs Solu-Medrol  in the ED.  And placed on BiPAP.  Review of Systems: As per HPI otherwise 14 point review of systems negative.    Past Medical History:  Diagnosis Date   Arthritis    COPD (chronic obstructive pulmonary disease) (HCC)    Hyperlipidemia    Hypertension     Past Surgical History:  Procedure Laterality Date   NO PAST SURGERIES       reports that he has been smoking cigarettes. He has never used smokeless tobacco. He reports current alcohol use. He reports current drug use. Drugs: Marijuana and Crack cocaine.  Allergies[1]  History reviewed. No pertinent family history.    Prior to Admission medications  Medication Sig Start Date End Date Taking? Authorizing Provider  acetaminophen  (TYLENOL ) 325 MG tablet Take 2 tablets (650 mg total) by mouth every 6 (six) hours as needed for mild pain (pain score 1-3), fever or headache. 06/12/23  Yes Von Bellis, MD  albuterol  (VENTOLIN  HFA) 108 [phone] Base) MCG/ACT inhaler Inhale 2 puffs into  the lungs every 6 (six) hours as needed for wheezing or shortness of breath. 06/12/23  Yes Von Bellis, MD  amLODipine  (NORVASC ) 10 MG tablet Take 1 tablet (10 mg total) by mouth daily. Hold if SBP <130 06/12/23 06/11/24 Yes Von Bellis, MD  docusate sodium  (COLACE) 100 MG capsule Take 1 capsule (100 mg total) by mouth 2 (two) times daily as needed for mild constipation. 11/10/23  Yes Sreenath, Sudheer B, MD  folic acid  (FOLVITE ) 1 MG tablet Take 1 tablet (1 mg total) by mouth daily. 11/10/23  Yes Sreenath, Sudheer B, MD  losartan  (COZAAR ) 100 MG tablet Take 1 tablet (100 mg total) by mouth daily. 11/10/23  Yes Sreenath, Sudheer B, MD  polyethylene glycol (MIRALAX  / GLYCOLAX ) 17 g packet Take 17 g by mouth daily as needed for mild constipation. 10/20/22  Yes Dorinda Drue DASEN, MD  thiamine  (VITAMIN B-1) 100 MG tablet Take 1 tablet (100 mg total) by mouth daily. 11/10/23  Yes Jhonny Calvin NOVAK, MD    Physical Exam: Vitals:   01/17/24 0201 01/17/24 0432 01/17/24 0700 01/17/24 0800  BP: (!) 159/113 (!) 134/95 (!) 136/97 (!) 140/94  Pulse: 76 69 91 88  Resp: (!) 23 19 (!) 22 (!) 27  Temp:  98.1 F (36.7 C)    TempSrc:  Oral    SpO2: 100% 100% 100% 100%  Weight:      Height:        Constitutional: NAD, calm, comfortable Vitals:   01/17/24 0201 01/17/24 0432 01/17/24 0700 01/17/24 0800  BP: ROLLEN)  159/113 (!) 134/95 (!) 136/97 (!) 140/94  Pulse: 76 69 91 88  Resp: (!) 23 19 (!) 22 (!) 27  Temp:  98.1 F (36.7 C)    TempSrc:  Oral    SpO2: 100% 100% 100% 100%  Weight:      Height:       Eyes: PERRL, lids and conjunctivae normal ENMT: Mucous membranes are moist. Posterior pharynx clear of any exudate or lesions.Normal dentition.  Neck: normal, supple, no masses, no thyromegaly Respiratory: Diminished breathing sound bilaterally, diffused wheezing, no crackles, increasing respiratory effort. No accessory muscle use.  Cardiovascular: Regular rate and rhythm, no murmurs / rubs / gallops. No  extremity edema. 2+ pedal pulses. No carotid bruits.  Abdomen: no tenderness, no masses palpated. No hepatosplenomegaly. Bowel sounds positive.  Musculoskeletal: no clubbing / cyanosis. No joint deformity upper and lower extremities. Good ROM, no contractures. Normal muscle tone.  Skin: no rashes, lesions, ulcers. No induration Neurologic: CN 2-12 grossly intact. Sensation intact, DTR normal. Strength 5/5 in all 4.  Psychiatric: Normal judgment and insight. Alert and oriented x 3. Normal mood.     Labs on Admission: I have personally reviewed following labs and imaging studies  CBC: Recent Labs  Lab 01/16/24 2357  WBC 5.1  NEUTROABS 2.0  HGB 14.2  HCT 43.4  MCV 90.4  PLT 265   Basic Metabolic Panel: Recent Labs  Lab 01/16/24 2357  NA 140  K 3.7  CL 105  CO2 25  GLUCOSE 102*  BUN 12  CREATININE 0.98  CALCIUM 9.1  MG 2.1   GFR: Estimated Creatinine Clearance: 61 mL/min (by C-G formula based on SCr of 0.98 mg/dL). Liver Function Tests: Recent Labs  Lab 01/16/24 2357  AST 25  ALT 11  ALKPHOS 69  BILITOT 0.3  PROT 7.7  ALBUMIN 4.3   No results for input(s): LIPASE, AMYLASE in the last 168 hours. No results for input(s): AMMONIA in the last 168 hours. Coagulation Profile: Recent Labs  Lab 01/16/24 2357  INR 0.9   Cardiac Enzymes: No results for input(s): CKTOTAL, CKMB, CKMBINDEX, TROPONINI in the last 168 hours. BNP (last 3 results) Recent Labs    01/16/24 2357  PROBNP 72.0   HbA1C: No results for input(s): HGBA1C in the last 72 hours. CBG: No results for input(s): GLUCAP in the last 168 hours. Lipid Profile: No results for input(s): CHOL, HDL, LDLCALC, TRIG, CHOLHDL, LDLDIRECT in the last 72 hours. Thyroid Function Tests: No results for input(s): TSH, T4TOTAL, FREET4, T3FREE, THYROIDAB in the last 72 hours. Anemia Panel: No results for input(s): VITAMINB12, FOLATE, FERRITIN, TIBC, IRON, RETICCTPCT  in the last 72 hours. Urine analysis: No results found for: COLORURINE, APPEARANCEUR, LABSPEC, PHURINE, GLUCOSEU, HGBUR, BILIRUBINUR, KETONESUR, PROTEINUR, UROBILINOGEN, NITRITE, LEUKOCYTESUR  Radiological Exams on Admission: DG Chest Port 1 View Result Date: 01/17/2024 EXAM: 1 VIEW XRAY OF THE CHEST 01/17/2024 12:09:00 AM COMPARISON: 10/25/2023 CLINICAL HISTORY: Questionable sepsis - evaluate for abnormality. FINDINGS: LUNGS AND PLEURA: No focal pulmonary opacity. No pleural effusion. No pneumothorax. HEART AND MEDIASTINUM: No acute abnormality of the cardiac and mediastinal silhouettes. BONES AND SOFT TISSUES: No acute osseous abnormality. IMPRESSION: 1. No acute cardiopulmonary abnormality. Electronically signed by: Pinkie Pebbles MD MD 01/17/2024 12:11 AM EST RP Workstation: [id number]    EKG: Pending  Assessment/Plan Principal Problem:   Acute respiratory failure with hypoxia (HCC) Active Problems:   COPD exacerbation (HCC)   CAP (community acquired pneumonia)  (please populate well all problems here in Problem List. (For  example, if patient is on BP meds at home and you resume or decide to hold them, it is a problem that needs to be her. Same for CAD, COPD, HLD and so on)  Acute hypoxic respiratory failure Acute COPD exacerbation - ED was able to wean off patient on BiPAP this morning.  Change BiPAP to as needed - Continue IV Solu-Medrol  - Add Breo - DuoNebs and as needed albuterol  - Short course of azithromycin   Cigarette smoking - Cessation education performed at bedside  HTN - Continue losartan  and amlodipine   DVT prophylaxis: Lovenox  Code Status: Full code Family Communication: None at bedside Disposition Plan: Patient sick with COPD hypoxia requiring BiPAP, IV Solu-Medrol , expect more than 2 midnight hospital stay. Consults called: None Admission status: PCU admit   Cort ONEIDA Mana MD [facility] Pager [phone]  01/17/2024, 9:25 AM        [1]  Allergies Allergen Reactions   Benzoyl Peroxide Anaphylaxis   Fish Oil Anaphylaxis   Methocarbamol Anaphylaxis   Pineapple Anaphylaxis   Carbomer    Tuberculin, Ppd     Other Reaction(s): Eruption of skin   Tuberculin Purified Protein Derivative Hives and Rash   "

## 2024-01-17 NOTE — ED Notes (Signed)
 RN to bedside to administer duoneb. Pt eating on RN arrival. RN attempted to assess pt respiratory status. Pt states  I don't like to talk when I'm eating.

## 2024-01-17 NOTE — ED Notes (Signed)
 MD made aware that pt breathing 30-35bpm.

## 2024-01-17 NOTE — ED Notes (Signed)
 RT called to put pt back on bipap.

## 2024-01-17 NOTE — Progress Notes (Signed)
 Patient trialed off BIPAP at this time, placed on 3L [place].

## 2024-01-17 NOTE — ED Notes (Signed)
 Consulting Civil Engineer at bedside. Patient verbally aggressive and loud. Reporting he is here to be served and if we are not going to serve him then he shouldn't be here. Patient reported as soon as he called out, staff should have been right in. Patient also stated people like me get sent to the basement here. That's why no one likes to come to this hospital because black people get thrown in the basement. That's how [facility] is. Patient also upset his lunch tray has not arrived yet. Patient made aware this RN called dietary and they reported the tray would be here in about 30 minutes. Patient also took off bipap and refuses to wear. Patient asked if he was having trouble breathing and patient declined. This RN told patient she would send in respiratory to put bipap back on and patient refused saying I am not wearing that until I get my food tray.  Patients bedside urinal emptied and asked if he needed anything else. Patient declined

## 2024-01-17 NOTE — ED Notes (Signed)
Charge RN at bedside at this time 

## 2024-01-18 ENCOUNTER — Other Ambulatory Visit: Payer: Self-pay

## 2024-01-18 DIAGNOSIS — Z7401 Bed confinement status: Secondary | ICD-10-CM

## 2024-01-18 DIAGNOSIS — I6930 Unspecified sequelae of cerebral infarction: Secondary | ICD-10-CM

## 2024-01-18 MED ORDER — IPRATROPIUM-ALBUTEROL 0.5-2.5 (3) MG/3ML IN SOLN
3.0000 mL | Freq: Two times a day (BID) | RESPIRATORY_TRACT | Status: DC
  Administered 2024-01-19: 3 mL via RESPIRATORY_TRACT
  Filled 2024-01-18: qty 3

## 2024-01-18 NOTE — Hospital Course (Signed)
 [name] is a 69 y.o. male with medical history significant of COPD, HTN, HLD, presented with worsening of cough shortness of breath.  Patient does not have recent upper respite infection, he continues to smoke. Upon arriving to hospital, he had significant respiratory distress with oxygen saturation at 82% on room air.  Patient was placed on Solu-Medrol  and BiPAP. Overnight, he has improved, and on nasal oxygen.

## 2024-01-18 NOTE — Progress Notes (Signed)
" °  Progress Note   Patient: Parker Brown FMW:[id number] DOB: [birth year] DOA: 01/16/2024     1 DOS: the patient was seen and examined on 01/18/2024   Brief hospital course: Sneijder Bernards is a 69 y.o. male with medical history significant of COPD, HTN, HLD, presented with worsening of cough shortness of breath.  Patient does not have recent upper respite infection, he continues to smoke. Upon arriving to hospital, he had significant respiratory distress with oxygen saturation at 82% on room air.  Patient was placed on Solu-Medrol  and BiPAP. Overnight, he has improved, and on nasal oxygen.   Principal Problem:   Acute respiratory failure with hypoxia (HCC) Active Problems:   HTN (hypertension)   COPD exacerbation (HCC)   History of stroke with current residual effects   Bedbound   Assessment and Plan: Acute respiratory failure with hypoxemia secondary to COPD exacerbation. Patient does not seem to have any volume overload.  No evidence of pneumonia.  He had a severe respiratory distress, was initially required BiPAP, but was able to transition to nasal cannula. Will continue steroids, continue scheduled DuoNeb.  Tobacco abuse. Cessation strongly urged.  Essential hypertension Continue some home medicines  History of stroke with right hemiparesis. Wheelchair-bound status. Will continue some home medicines, patient has no interest in going to rehab.  He has a family member taking care of him.  Most likely will discharge home tomorrow.     Subjective:  Patient feels much better today with breathing, still have a cough, nonproductive now. No fever or chills.  Physical Exam: Vitals:   01/18/24 0700 01/18/24 0730 01/18/24 0745 01/18/24 0833  BP: (!) 126/90 (!) 141/99  (!) 133/97  Pulse: 67 77 78 83  Resp: 14  19 20   Temp:    97.7 F (36.5 C)  TempSrc:    Oral  SpO2: 100% 100% 100% 100%  Weight:      Height:       General exam: Appears calm and  comfortable  Respiratory system: Decreased breath sounds. Respiratory effort normal. Cardiovascular system: S1 & S2 heard, RRR. No JVD, murmurs, rubs, gallops or clicks. No pedal edema. Gastrointestinal system: Abdomen is nondistended, soft and nontender. No organomegaly or masses felt. Normal bowel sounds heard. Central nervous system: Alert and oriented. No focal neurological deficits. Extremities: Symmetric 5 x 5 power. Skin: No rashes, lesions or ulcers Psychiatry: Judgement and insight appear normal. Mood & affect appropriate.    Data Reviewed:  Reviewed chest x-ray no acute changes, reviewed lab results  Family Communication: None  Disposition: Status is: Inpatient Remains inpatient appropriate because: Severity of disease.     Time spent: 35 minutes  Author: Murvin Mana, MD 01/18/2024 9:49 AM  For on call review [email or web address].    "

## 2024-01-19 ENCOUNTER — Other Ambulatory Visit: Payer: Self-pay

## 2024-01-19 LAB — HIV ANTIBODY (ROUTINE TESTING W REFLEX): HIV Screen 4th Generation wRfx: NONREACTIVE

## 2024-01-19 LAB — PROCALCITONIN: Procalcitonin: <0.1 ng/mL

## 2024-01-19 MED ORDER — FLUTICASONE-SALMETEROL 250-50 MCG/ACT IN AEPB
1.0000 | INHALATION_SPRAY | Freq: Two times a day (BID) | RESPIRATORY_TRACT | 0 refills | Status: AC
  Filled 2024-01-19: qty 60, 30d supply, fill #0

## 2024-01-19 MED ORDER — PREDNISONE 20 MG PO TABS
40.0000 mg | ORAL_TABLET | Freq: Every day | ORAL | 0 refills | Status: AC
  Filled 2024-01-19: qty 8, 4d supply, fill #0

## 2024-01-19 NOTE — Plan of Care (Signed)
" °  Problem: Education: Goal: Knowledge of General Education information will improve Description: Including pain rating scale, medication(s)/side effects and non-pharmacologic comfort measures Outcome: Progressing   Problem: Clinical Measurements: Goal: Respiratory complications will improve Outcome: Progressing   Problem: Clinical Measurements: Goal: Cardiovascular complication will be avoided Outcome: Progressing   Problem: Activity: Goal: Risk for activity intolerance will decrease Outcome: Progressing   Problem: Nutrition: Goal: Adequate nutrition will be maintained Outcome: Progressing   Problem: Elimination: Goal: Will not experience complications related to urinary retention Outcome: Progressing   Problem: Pain Managment: Goal: General experience of comfort will improve and/or be controlled Outcome: Progressing   Problem: Safety: Goal: Ability to remain free from injury will improve Outcome: Progressing   "

## 2024-01-19 NOTE — TOC Progression Note (Signed)
 Transition of Care [facility]) - Progression Note    Patient Details  Name: Parker Brown MRN: [id number] Date of Birth: [birth year]  Transition of Care [facility] CM/SW Contact  Shasta DELENA Daring, RN Phone Number: 01/19/2024, 9:48 AM  Clinical Narrative:    RNCM notified by MD that patient is being discharged and needs to have transportation arranged. Spoke with patient. He is hemiplegic, unable to walk. He came via EMS so does not have his wheelchair. He lives with his sister who does not drive and does not have a phone. Typically asks neighbors for rides, but does not have anyone's phone number. He says he just needs someone to drive him to his house and then to go knock on the door so his sister can bring his wheelchair out to the car.  He says he is certain that she is home now. RNCM called second sister Sp[place], emergency contact) and left a voice message requesting a return call.  Notified leadership transportation needs.                      Expected Discharge Plan and Services         Expected Discharge Date: 01/19/24                                     Social Drivers of Health (SDOH) Interventions SDOH Screenings   Food Insecurity: No Food Insecurity (01/18/2024)  Housing: Low Risk (01/18/2024)  Transportation Needs: No Transportation Needs (01/18/2024)  Utilities: Not At Risk (01/18/2024)  Social Connections: Socially Isolated (01/18/2024)  Tobacco Use: High Risk (01/16/2024)    Readmission Risk Interventions    10/27/2023   11:45 AM  Readmission Risk Prevention Plan  Transportation Screening Complete  PCP or Specialist Appt within 5-7 Days Complete  Medication Review (RN CM) Complete

## 2024-01-19 NOTE — Progress Notes (Signed)
 SPIRITUAL CARE AND COUNSELING CONSULT NOTE   VISIT SUMMARY Chaplain provided spiritual/emotional support to [place] and family while rounding on unit. Chaplain consulted with nurse team.   SPIRITUAL ENCOUNTER                                                                                                                                                                      Type of Visit: Initial Care provided to:: Pt and family Conversation partners present during encounter: Nurse Reason for visit: Routine spiritual support OnCall Visit: No   SPIRITUAL FRAMEWORK  Presenting Themes: Meaning/purpose/sources of inspiration, Community and relationships, Other (comment) (advocacy to staff for pt./family needs) Community/Connection: Family, Friend(s) Strengths: family/community support Patient Stress Factors: None identified Family Stress Factors: Family relationships   GOALS   Self/Personal Goals: healing, successful discharge Clinical Care Goals: healing, successful discharge   INTERVENTIONS   Spiritual Care Interventions Made: Compassionate presence, Supported grief process, Reflective listening   Chaplain assessed that Parker Brown's supported by his family/community. [name] feels passionate about discharge.  INTERVENTION OUTCOMES   Outcomes: Connection to values and goals of care, Awareness of support, Patient family open to resources  SPIRITUAL CARE PLAN   Spiritual Care Issues Still Outstanding: Chaplain will continue to follow    If immediate needs arise, please contact [facility] 24 hour on call [phone]   Barabara Chess, Chaplain  01/19/2024 2:06 PM

## 2024-01-19 NOTE — Progress Notes (Signed)
 Mobility Specialist - Progress Note   01/19/24 1129  Mobility  Activity Ambulated with assistance  Level of Assistance Contact guard assist, steadying assist  Assistive Device Front wheel walker  Distance Ambulated (ft) 8 ft  Activity Response Tolerated well  Mobility visit 1 Mobility  Mobility Specialist Start Time (ACUTE ONLY) 1050  Mobility Specialist Stop Time (ACUTE ONLY) 1105  Mobility Specialist Time Calculation (min) (ACUTE ONLY) 15 min   Pt sitting on the commode upon entry, utilizing RA. Pt required CGA to stand and amb to bed, tolerated well. Pt left seated EOB with alarm set and needs within reach.  Parker Brown Mobility Specialist 01/19/2024 11:37 AM

## 2024-01-19 NOTE — TOC Transition Note (Signed)
 Transition of Care [facility]) - Discharge Note   Patient Details  Name: Parker Brown MRN: [id number] Date of Birth: 11-06-1955  [facility] CM/SW Contact:  Alfonso Rummer, LCSW Phone Number: 01/19/2024, 4:31 PM   Clinical Narrative:    KEN DELENA Rummer spk with [facility] pt ex spouse agreed to pick up pt at discharge. Pt spouse was lost in the parking lot for two hours however nursing staff help spouse find her car and assisted with transferring pt into the car.          Patient Goals and CMS Choice            Discharge Placement                       Discharge Plan and Services Additional resources added to the After Visit Summary for                                       Social Drivers of Health (SDOH) Interventions SDOH Screenings   Food Insecurity: No Food Insecurity (01/18/2024)  Housing: Low Risk (01/18/2024)  Transportation Needs: No Transportation Needs (01/18/2024)  Utilities: Not At Risk (01/18/2024)  Social Connections: Socially Isolated (01/18/2024)  Tobacco Use: High Risk (01/16/2024)     Readmission Risk Interventions    10/27/2023   11:45 AM  Readmission Risk Prevention Plan  Transportation Screening Complete  PCP or Specialist Appt within 5-7 Days Complete  Medication Review (RN CM) Complete

## 2024-01-19 NOTE — Discharge Summary (Signed)
 " Physician Discharge Summary   Patient: Parker Brown MRN: [id number] DOB: 14-Dec-1955  Admit date:     01/16/2024  Discharge date: 01/19/2024  Discharge Physician: Murvin Mana   PCP: Pcp, No   Recommendations at discharge:   Follow-up with PCP in 1 week.  TOC to arrange  Discharge Diagnoses: Principal Problem:   Acute respiratory failure with hypoxia (HCC) Active Problems:   HTN (hypertension)   COPD exacerbation (HCC)   History of stroke with current residual effects   Bedbound  Resolved Problems:   * No resolved hospital problems. *  Hospital Course: Parker Brown is a 69 y.o. male with medical history significant of COPD, HTN, HLD, presented with worsening of cough shortness of breath.  Patient does not have recent upper respite infection, he continues to smoke. Upon arriving to hospital, he had significant respiratory distress with oxygen saturation at 82% on room air.  Patient was placed on Solu-Medrol  and BiPAP. Overnight, he has improved, and on nasal oxygen. Patient condition has improved since admission, short of breath resolved.  Off oxygen.  Medically stable for discharge.  Assessment and Plan: Acute respiratory failure with hypoxemia secondary to COPD exacerbation. Patient does not seem to have any volume overload.  No evidence of pneumonia.  He had a severe respiratory distress, was initially required BiPAP, but was able to transition to nasal cannula. Patient condition continued to improve, now off oxygen with good saturation.  Will continue 4 days of oral steroids, also added Advair, continue as needed albuterol .  Medically stable for discharge.   Tobacco abuse. Cessation strongly urged.   Essential hypertension Continue some home medicines   History of stroke with right hemiparesis. Wheelchair-bound status. Will continue some home medicines, patient has no interest in going to rehab.  He has a family member taking care of him.           Consultants: None Procedures performed: None  Disposition: Home Diet recommendation:  Discharge Diet Orders (From admission, onward)     Start     Ordered   01/19/24 0000  Diet - low sodium heart healthy        01/19/24 0925           Cardiac diet DISCHARGE MEDICATION: Allergies as of 01/19/2024       Reactions   Benzoyl Peroxide Anaphylaxis   Fish Oil Anaphylaxis   Methocarbamol Anaphylaxis   Pineapple Anaphylaxis   Carbomer    Tuberculin, Ppd    Other Reaction(s): Eruption of skin   Tuberculin Purified Protein Derivative Hives, Rash        Medication List     TAKE these medications    acetaminophen  325 MG tablet Commonly known as: TYLENOL  Take 2 tablets (650 mg total) by mouth every 6 (six) hours as needed for mild pain (pain score 1-3), fever or headache.   albuterol  108 (90 Base) MCG/ACT inhaler Commonly known as: VENTOLIN  HFA Inhale 2 puffs into the lungs every 6 (six) hours as needed for wheezing or shortness of breath.   amLODipine  10 MG tablet Commonly known as: NORVASC  Take 1 tablet (10 mg total) by mouth daily. Hold if SBP <130   docusate sodium  100 MG capsule Commonly known as: COLACE Take 1 capsule (100 mg total) by mouth 2 (two) times daily as needed for mild constipation.   fluticasone -salmeterol 230-21 MCG/ACT inhaler Commonly known as: Advair HFA Inhale 2 puffs into the lungs 2 (two) times daily.   folic acid  1 MG tablet  Commonly known as: FOLVITE  Take 1 tablet (1 mg total) by mouth daily.   gabapentin  300 MG capsule Commonly known as: NEURONTIN  Take 300 mg by mouth 3 (three) times daily.   losartan  100 MG tablet Commonly known as: COZAAR  Take 1 tablet (100 mg total) by mouth daily.   polyethylene glycol 17 g packet Commonly known as: MIRALAX  / GLYCOLAX  Take 17 g by mouth daily as needed for mild constipation.   predniSONE  20 MG tablet Commonly known as: DELTASONE  Take 2 tablets (40 mg total) by mouth daily with  breakfast for 4 days.   thiamine  100 MG tablet Commonly known as: Vitamin B-1 Take 1 tablet (100 mg total) by mouth daily.        Discharge Exam: Filed Weights   01/16/24 2350  Weight: 68.9 kg   General exam: Appears calm and comfortable  Respiratory system: Significant decreased breath sounds. Respiratory effort normal. Cardiovascular system: S1 & S2 heard, RRR. No JVD, murmurs, rubs, gallops or clicks. No pedal edema. Gastrointestinal system: Abdomen is nondistended, soft and nontender. No organomegaly or masses felt. Normal bowel sounds heard. Central nervous system: Alert and oriented. No focal neurological deficits. Extremities: Symmetric 5 x 5 power. Skin: No rashes, lesions or ulcers Psychiatry: Judgement and insight appear normal. Mood & affect appropriate.    Condition at discharge: good  The results of significant diagnostics from this hospitalization (including imaging, microbiology, ancillary and laboratory) are listed below for reference.   Imaging Studies: DG Chest Port 1 View Result Date: 01/17/2024 EXAM: 1 VIEW XRAY OF THE CHEST 01/17/2024 12:09:00 AM COMPARISON: 10/25/2023 CLINICAL HISTORY: Questionable sepsis - evaluate for abnormality. FINDINGS: LUNGS AND PLEURA: No focal pulmonary opacity. No pleural effusion. No pneumothorax. HEART AND MEDIASTINUM: No acute abnormality of the cardiac and mediastinal silhouettes. BONES AND SOFT TISSUES: No acute osseous abnormality. IMPRESSION: 1. No acute cardiopulmonary abnormality. Electronically signed by: Pinkie Pebbles MD MD 01/17/2024 12:11 AM EST RP Workstation: [id number]    Microbiology: Results for orders placed or performed during the hospital encounter of 01/16/24  Blood Culture (routine x 2)     Status: None (Preliminary result)   Collection Time: 01/16/24 11:56 PM   Specimen: BLOOD  Result Value Ref Range Status   Specimen Description BLOOD BLOOD LEFT ARM  Final   Special Requests   Final    BOTTLES DRAWN  AEROBIC AND ANAEROBIC Blood Culture results may not be optimal due to an inadequate volume of blood received in culture bottles   Culture   Final    NO GROWTH 2 DAYS Performed at [facility], [address], [place], [place] [address]    Report Status PENDING  Incomplete  Blood Culture (routine x 2)     Status: None (Preliminary result)   Collection Time: 01/16/24 11:56 PM   Specimen: BLOOD RIGHT ARM  Result Value Ref Range Status   Specimen Description BLOOD RIGHT ARM  Final   Special Requests   Final    BOTTLES DRAWN AEROBIC AND ANAEROBIC Blood Culture results may not be optimal due to an inadequate volume of blood received in culture bottles   Culture   Final    NO GROWTH 2 DAYS Performed at [facility], [address] [place], [place] [address]    Report Status PENDING  Incomplete  Respiratory (~20 pathogens) panel by PCR     Status: None   Collection Time: 01/16/24 11:56 PM   Specimen: Nasopharyngeal Swab; Respiratory  Result Value Ref Range Status  Adenovirus NOT DETECTED NOT DETECTED Final   Coronavirus 229E NOT DETECTED NOT DETECTED Final    Comment: (NOTE) The Coronavirus on the Respiratory Panel, DOES NOT test for the novel  Coronavirus (2019 nCoV)    Coronavirus HKU1 NOT DETECTED NOT DETECTED Final   Coronavirus NL63 NOT DETECTED NOT DETECTED Final   Coronavirus OC43 NOT DETECTED NOT DETECTED Final   Metapneumovirus NOT DETECTED NOT DETECTED Final   Rhinovirus / Enterovirus NOT DETECTED NOT DETECTED Final   Influenza A NOT DETECTED NOT DETECTED Final   Influenza B NOT DETECTED NOT DETECTED Final   Parainfluenza Virus 1 NOT DETECTED NOT DETECTED Final   Parainfluenza Virus 2 NOT DETECTED NOT DETECTED Final   Parainfluenza Virus 3 NOT DETECTED NOT DETECTED Final   Parainfluenza Virus 4 NOT DETECTED NOT DETECTED Final   Respiratory Syncytial Virus NOT DETECTED NOT DETECTED Final   Bordetella pertussis NOT DETECTED NOT DETECTED Final    Bordetella Parapertussis NOT DETECTED NOT DETECTED Final   Chlamydophila pneumoniae NOT DETECTED NOT DETECTED Final   Mycoplasma pneumoniae NOT DETECTED NOT DETECTED Final    Comment: Performed at [facility], [address], [place], [place] [address]  Resp panel by RT-PCR (RSV, Flu A&B, Covid) Anterior Nasal Swab     Status: None   Collection Time: 01/16/24 11:57 PM   Specimen: Anterior Nasal Swab  Result Value Ref Range Status   SARS Coronavirus 2 by RT PCR NEGATIVE NEGATIVE Final    Comment: (NOTE) SARS-CoV-2 target nucleic acids are NOT DETECTED.  The SARS-CoV-2 RNA is generally detectable in upper respiratory specimens during the acute phase of infection. The lowest concentration of SARS-CoV-2 viral copies this assay can detect is 138 copies/mL. A negative result does not preclude SARS-Cov-2 infection and should not be used as the sole basis for treatment or other patient management decisions. A negative result may occur with  improper specimen collection/handling, submission of specimen other than nasopharyngeal swab, presence of viral mutation(s) within the areas targeted by this assay, and inadequate number of viral copies(<138 copies/mL). A negative result must be combined with clinical observations, patient history, and epidemiological information. The expected result is Negative.  Fact Sheet for Patients:  bloggercourse.com  Fact Sheet for Healthcare Providers:  seriousbroker.it  This test is no t yet approved or cleared by the [place]  FDA and  has been authorized for detection and/or diagnosis of SARS-CoV-2 by FDA under an Emergency Use Authorization (EUA). This EUA will remain  in effect (meaning this test can be used) for the duration of the COVID-19 declaration under Section 564(b)(1) of the Act, 21 U.S.C.section 360bbb-3(b)(1), unless the authorization is terminated  or revoked sooner.        Influenza A by PCR NEGATIVE NEGATIVE Final   Influenza B by PCR NEGATIVE NEGATIVE Final    Comment: (NOTE) The Xpert Xpress SARS-CoV-2/FLU/RSV plus assay is intended as an aid in the diagnosis of influenza from Nasopharyngeal swab specimens and should not be used as a sole basis for treatment. Nasal washings and aspirates are unacceptable for Xpert Xpress SARS-CoV-2/FLU/RSV testing.  Fact Sheet for Patients: bloggercourse.com  Fact Sheet for Healthcare Providers: seriousbroker.it  This test is not yet approved or cleared by the [place]  FDA and has been authorized for detection and/or diagnosis of SARS-CoV-2 by FDA under an Emergency Use Authorization (EUA). This EUA will remain in effect (meaning this test can be used) for the duration of the COVID-19 declaration under Section 564(b)(1) of the Act,  21 U.S.C. section 360bbb-3(b)(1), unless the authorization is terminated or revoked.     Resp Syncytial Virus by PCR NEGATIVE NEGATIVE Final    Comment: (NOTE) Fact Sheet for Patients: bloggercourse.com  Fact Sheet for Healthcare Providers: seriousbroker.it  This test is not yet approved or cleared by the [place]  FDA and has been authorized for detection and/or diagnosis of SARS-CoV-2 by FDA under an Emergency Use Authorization (EUA). This EUA will remain in effect (meaning this test can be used) for the duration of the COVID-19 declaration under Section 564(b)(1) of the Act, 21 U.S.C. section 360bbb-3(b)(1), unless the authorization is terminated or revoked.  Performed at [facility], [address] [place], [place] [address]     Labs: CBC: Recent Labs  Lab 01/16/24 2357  WBC 5.1  NEUTROABS 2.0  HGB 14.2  HCT 43.4  MCV 90.4  PLT 265   Basic Metabolic Panel: Recent Labs  Lab 01/16/24 2357  NA 140  K 3.7  CL 105  CO2 25  GLUCOSE 102*  BUN  12  CREATININE 0.98  CALCIUM 9.1  MG 2.1   Liver Function Tests: Recent Labs  Lab 01/16/24 2357  AST 25  ALT 11  ALKPHOS 69  BILITOT 0.3  PROT 7.7  ALBUMIN 4.3   CBG: No results for input(s): GLUCAP in the last 168 hours.  Discharge time spent: 35 minutes.  Signed: Murvin Mana, MD [facility] 01/19/2024 "

## 2024-01-20 LAB — MYCOPLASMA PNEUMONIAE ANTIBODY, IGM: Mycoplasma pneumo IgM: <770 U/mL (ref 0–769)

## 2024-01-22 LAB — CULTURE, BLOOD (ROUTINE X 2)
Culture: NO GROWTH
Culture: NO GROWTH
